# Patient Record
Sex: Female | Born: 1935
Health system: Southern US, Community
[De-identification: ages and names within clinical notes are randomized; demographics above are authoritative.]

## PROBLEM LIST (undated history)

## (undated) DIAGNOSIS — T4145XA Adverse effect of unspecified anesthetic, initial encounter: Secondary | ICD-10-CM

## (undated) DIAGNOSIS — Z8489 Family history of other specified conditions: Secondary | ICD-10-CM

## (undated) DIAGNOSIS — M1990 Unspecified osteoarthritis, unspecified site: Secondary | ICD-10-CM

## (undated) DIAGNOSIS — IMO0002 Reserved for concepts with insufficient information to code with codable children: Secondary | ICD-10-CM

## (undated) DIAGNOSIS — Z923 Personal history of irradiation: Secondary | ICD-10-CM

## (undated) DIAGNOSIS — T8859XA Other complications of anesthesia, initial encounter: Secondary | ICD-10-CM

## (undated) DIAGNOSIS — B029 Zoster without complications: Secondary | ICD-10-CM

## (undated) DIAGNOSIS — I10 Essential (primary) hypertension: Secondary | ICD-10-CM

## (undated) DIAGNOSIS — E785 Hyperlipidemia, unspecified: Secondary | ICD-10-CM

## (undated) DIAGNOSIS — Z9889 Other specified postprocedural states: Secondary | ICD-10-CM

## (undated) DIAGNOSIS — K219 Gastro-esophageal reflux disease without esophagitis: Secondary | ICD-10-CM

## (undated) HISTORY — DX: Unspecified osteoarthritis, unspecified site: M19.90

## (undated) HISTORY — DX: Other specified postprocedural states: Z98.890

## (undated) HISTORY — DX: Essential (primary) hypertension: I10

## (undated) HISTORY — DX: Hyperlipidemia, unspecified: E78.5

## (undated) HISTORY — PX: FRACTURE SURGERY: SHX138

## (undated) HISTORY — DX: Zoster without complications: B02.9

## (undated) HISTORY — DX: Reserved for concepts with insufficient information to code with codable children: IMO0002

---

## 1973-04-19 HISTORY — PX: ABDOMINAL HYSTERECTOMY: SHX81

## 1997-09-19 ENCOUNTER — Ambulatory Visit (HOSPITAL_BASED_OUTPATIENT_CLINIC_OR_DEPARTMENT_OTHER): Admission: RE | Admit: 1997-09-19 | Discharge: 1997-09-19 | Payer: Self-pay | Admitting: Orthopedic Surgery

## 1999-04-20 DIAGNOSIS — B029 Zoster without complications: Secondary | ICD-10-CM

## 1999-04-20 HISTORY — DX: Zoster without complications: B02.9

## 1999-07-27 ENCOUNTER — Encounter: Payer: Self-pay | Admitting: Gastroenterology

## 1999-07-27 ENCOUNTER — Encounter: Admission: RE | Admit: 1999-07-27 | Discharge: 1999-07-27 | Payer: Self-pay | Admitting: Gastroenterology

## 2000-07-29 ENCOUNTER — Encounter: Payer: Self-pay | Admitting: Gastroenterology

## 2000-07-29 ENCOUNTER — Encounter: Admission: RE | Admit: 2000-07-29 | Discharge: 2000-07-29 | Payer: Self-pay | Admitting: Gastroenterology

## 2001-08-18 ENCOUNTER — Encounter: Payer: Self-pay | Admitting: Gastroenterology

## 2001-08-18 ENCOUNTER — Encounter: Admission: RE | Admit: 2001-08-18 | Discharge: 2001-08-18 | Payer: Self-pay | Admitting: Gastroenterology

## 2001-10-25 ENCOUNTER — Other Ambulatory Visit: Admission: RE | Admit: 2001-10-25 | Discharge: 2001-10-25 | Payer: Self-pay | Admitting: Gastroenterology

## 2002-05-10 ENCOUNTER — Encounter: Payer: Self-pay | Admitting: Internal Medicine

## 2002-05-10 ENCOUNTER — Encounter: Admission: RE | Admit: 2002-05-10 | Discharge: 2002-05-10 | Payer: Self-pay | Admitting: Internal Medicine

## 2003-03-04 ENCOUNTER — Encounter: Admission: RE | Admit: 2003-03-04 | Discharge: 2003-03-04 | Payer: Self-pay | Admitting: Gastroenterology

## 2003-04-20 DIAGNOSIS — Z9889 Other specified postprocedural states: Secondary | ICD-10-CM

## 2003-04-20 HISTORY — PX: ROTATOR CUFF REPAIR: SHX139

## 2003-04-20 HISTORY — DX: Other specified postprocedural states: Z98.890

## 2003-12-06 ENCOUNTER — Ambulatory Visit (HOSPITAL_COMMUNITY): Admission: RE | Admit: 2003-12-06 | Discharge: 2003-12-06 | Payer: Self-pay | Admitting: Gastroenterology

## 2004-03-05 ENCOUNTER — Ambulatory Visit (HOSPITAL_COMMUNITY): Admission: RE | Admit: 2004-03-05 | Discharge: 2004-03-05 | Payer: Self-pay | Admitting: Orthopedic Surgery

## 2004-03-05 ENCOUNTER — Ambulatory Visit (HOSPITAL_BASED_OUTPATIENT_CLINIC_OR_DEPARTMENT_OTHER): Admission: RE | Admit: 2004-03-05 | Discharge: 2004-03-05 | Payer: Self-pay | Admitting: Orthopedic Surgery

## 2004-06-22 ENCOUNTER — Encounter: Admission: RE | Admit: 2004-06-22 | Discharge: 2004-06-22 | Payer: Self-pay | Admitting: Sports Medicine

## 2006-01-18 ENCOUNTER — Encounter: Admission: RE | Admit: 2006-01-18 | Discharge: 2006-01-18 | Payer: Self-pay | Admitting: Gastroenterology

## 2006-06-16 ENCOUNTER — Encounter: Admission: RE | Admit: 2006-06-16 | Discharge: 2006-06-16 | Payer: Self-pay | Admitting: Gastroenterology

## 2007-03-09 ENCOUNTER — Encounter: Admission: RE | Admit: 2007-03-09 | Discharge: 2007-03-09 | Payer: Self-pay | Admitting: Gastroenterology

## 2008-03-15 ENCOUNTER — Encounter: Admission: RE | Admit: 2008-03-15 | Discharge: 2008-03-15 | Payer: Self-pay | Admitting: Gastroenterology

## 2009-03-20 ENCOUNTER — Encounter: Admission: RE | Admit: 2009-03-20 | Discharge: 2009-03-20 | Payer: Self-pay | Admitting: Internal Medicine

## 2010-04-03 ENCOUNTER — Encounter
Admission: RE | Admit: 2010-04-03 | Discharge: 2010-04-03 | Payer: Self-pay | Source: Home / Self Care | Attending: Internal Medicine | Admitting: Internal Medicine

## 2010-05-09 ENCOUNTER — Encounter: Payer: Self-pay | Admitting: Gastroenterology

## 2010-09-04 NOTE — Op Note (Signed)
NAME:  Valerie Cooper, Valerie Cooper NO.:  [id number]   MEDICAL RECORD NO.:  [id number]          PATIENT TYPE:  AMB   LOCATION:  DSC                          FACILITY:  [facility]   PHYSICIAN:  Loreta Ave, M.D. DATE OF BIRTH:  17-Nov-1933   DATE OF PROCEDURE:  03/05/2004  DATE OF DISCHARGE:                                 OPERATIVE REPORT   PREOPERATIVE DIAGNOSES:  1.  Traumatic injury to right shoulder with partial tearing of rotator cuff.  2.  Recurrent impingement.  3.  Status post acromioplasty and distal clavicle excision, 1998, without      any symptoms until new trauma.   POSTOPERATIVE DIAGNOSES:  1.  Traumatic injury to right shoulder with partial tearing of rotator cuff.  2.  Recurrent impingement.  3.  Status post acromioplasty and distal clavicle excision, 1998, without      any symptoms until new trauma.   PROCEDURES:  1.  Right shoulder examination under anesthesia, arthroscopy, debridement of      rotator cuff from above and below.  2.  Revision of acromioplasty and distal clavicle excision.  3.  Debridement of bursa and adhesions.   SURGEON:  Loreta Ave, M.D.   ASSISTANT:  Genene Churn. Denton Meek.   ANESTHESIA:  General.   ESTIMATED BLOOD LOSS:  Minimal.   SPECIMENS:  None.   CULTURES:  None.   COMPLICATIONS:  None.   DRESSING:  Soft compressive with sling.   PROCEDURE:  The patient brought to the operating room and after adequate  anesthesia had been obtained, right shoulder examined.  Full motion and good  stability.  Placed in a beach chair position in a shoulder positioner  prepped and draped in the usual sterile fashion.  Three portals, anterior,  posterior, and lateral.  Shoulder entered with a blunt obturator, distended,  and inspected.  Some focal grade 3 changes, glenoid, debrided.  Labrum,  biceps tendon, biceps anchor, capsular and ligamentous structures,  undersurface cuff all intact.  The undersurface of the cuff, however, was  noted to be thinned, but no partial or full-thickness tearing.  Cannula  redirected subacromially.  Partial tearing, abrasive in nature, superior  cuff, especially supraspinatus tendon, debrided.  Thinned over the crescent  region, but no full-thickness tears.  Infraspinatus looked better.  Subacromial adhesions and bursitis, all debrided.  Acromion had a little  overgrowth in the front to a type 2 acromion with a revision acromioplasty  performed today, bringing it up to a type 1 acromion with shaver and high-  speed bur.  The  CA ligament re-released.  Distal clavicle exposed.  Inferiorly there was an excellent resection there, but there had been some  overgrowth superiorly.  Revision resection of the distal clavicle performed,  resecting a good centimeter so there was a centimeter gap both top and  bottom of the [facility] resection site.  All spurs removed.  Adequacy of  decompression and debridement confirmed viewing from all portals.  Instruments and fluid removed.  Portals, shoulder, and bursa injected with  Marcaine.  Portals closed with 4-0 nylon.  A sterile  compressive dressing  applied.  Anesthesia reversed.  Brought to the recovery room.  Tolerated the  surgery well with no complications.      Valentino Saxon   DFM/MEDQ  D:  03/05/2004  T:  03/06/2004  Job:  [id number]

## 2011-03-15 ENCOUNTER — Other Ambulatory Visit: Payer: Self-pay | Admitting: Internal Medicine

## 2011-03-15 DIAGNOSIS — Z1231 Encounter for screening mammogram for malignant neoplasm of breast: Secondary | ICD-10-CM

## 2011-04-16 ENCOUNTER — Ambulatory Visit
Admission: RE | Admit: 2011-04-16 | Discharge: 2011-04-16 | Disposition: A | Discharge status: Home / Self Care | Payer: BC Managed Care – PPO | Source: Ambulatory Visit | Attending: Internal Medicine | Admitting: Internal Medicine

## 2011-04-16 DIAGNOSIS — Z1231 Encounter for screening mammogram for malignant neoplasm of breast: Secondary | ICD-10-CM

## 2011-05-08 ENCOUNTER — Emergency Department: Payer: Self-pay | Admitting: Emergency Medicine

## 2011-05-19 ENCOUNTER — Other Ambulatory Visit: Payer: Self-pay | Admitting: Internal Medicine

## 2011-05-19 DIAGNOSIS — Z1231 Encounter for screening mammogram for malignant neoplasm of breast: Secondary | ICD-10-CM

## 2011-08-23 ENCOUNTER — Other Ambulatory Visit: Payer: Self-pay | Admitting: Internal Medicine

## 2011-08-23 DIAGNOSIS — K8020 Calculus of gallbladder without cholecystitis without obstruction: Secondary | ICD-10-CM

## 2011-09-20 ENCOUNTER — Other Ambulatory Visit: Payer: BC Managed Care – PPO

## 2011-09-21 ENCOUNTER — Other Ambulatory Visit: Payer: BC Managed Care – PPO

## 2011-09-22 ENCOUNTER — Ambulatory Visit
Admission: RE | Admit: 2011-09-22 | Discharge: 2011-09-22 | Disposition: A | Discharge status: Home / Self Care | Payer: BC Managed Care – PPO | Source: Ambulatory Visit | Attending: Internal Medicine | Admitting: Internal Medicine

## 2011-09-22 DIAGNOSIS — K8020 Calculus of gallbladder without cholecystitis without obstruction: Secondary | ICD-10-CM

## 2011-09-28 ENCOUNTER — Encounter (INDEPENDENT_AMBULATORY_CARE_PROVIDER_SITE_OTHER): Payer: Self-pay | Admitting: Surgery

## 2011-10-20 ENCOUNTER — Ambulatory Visit (INDEPENDENT_AMBULATORY_CARE_PROVIDER_SITE_OTHER): Payer: Medicare Other | Admitting: Surgery

## 2011-11-01 ENCOUNTER — Ambulatory Visit (INDEPENDENT_AMBULATORY_CARE_PROVIDER_SITE_OTHER): Payer: Medicare Other | Admitting: Surgery

## 2011-11-01 ENCOUNTER — Encounter (INDEPENDENT_AMBULATORY_CARE_PROVIDER_SITE_OTHER): Payer: Self-pay | Admitting: Surgery

## 2011-11-01 VITALS — BP 122/78 | HR 78 | Temp 97.0°F | Resp 16 | Ht 61.0 in | Wt 163.0 lb

## 2011-11-01 DIAGNOSIS — K279 Peptic ulcer, site unspecified, unspecified as acute or chronic, without hemorrhage or perforation: Secondary | ICD-10-CM | POA: Insufficient documentation

## 2011-11-01 DIAGNOSIS — K5710 Diverticulosis of small intestine without perforation or abscess without bleeding: Secondary | ICD-10-CM | POA: Insufficient documentation

## 2011-11-01 DIAGNOSIS — R7401 Elevation of levels of liver transaminase levels: Secondary | ICD-10-CM | POA: Insufficient documentation

## 2011-11-01 DIAGNOSIS — K8020 Calculus of gallbladder without cholecystitis without obstruction: Secondary | ICD-10-CM | POA: Insufficient documentation

## 2011-11-01 NOTE — Patient Instructions (Signed)
See the Handout(s) we gave you.  Consider surgery.  Please call our office at [phone] if you wish to schedule surgery or if you have further questions / concerns.   Cholelithiasis Cholelithiasis (also called gallstones) is a form of gallbladder disease where gallstones form in your gallbladder. The gallbladder is a non-essential organ that stores bile made in the liver, which helps digest fats. Gallstones begin as small crystals and slowly grow into stones. Gallstone pain occurs when the gallbladder spasms, and a gallstone is blocking the duct. Pain can also occur when a stone passes out of the duct.  Women are more likely to develop gallstones than men. Other factors that increase the risk of gallbladder disease are:  Having multiple pregnancies. Physicians sometimes advise removing diseased gallbladders before future pregnancies.   Obesity.   Diets heavy in fried foods and fat.   Increasing age (older than 60).   Prolonged use of medications containing female hormones.   Diabetes mellitus.   Rapid weight loss.   Family history of gallstones (heredity).  SYMPTOMS  Feeling sick to your stomach (nauseous).   Abdominal pain.   Yellowing of the skin (jaundice).   Sudden pain. It may persist from several minutes to several hours.   Worsening pain with deep breathing or when jarred.   Fever.   Tenderness to the touch.  In some cases, when gallstones do not move into the bile duct, people have no pain or symptoms. These are called "silent" gallstones. TREATMENT In severe cases, emergency surgery may be required. HOME CARE INSTRUCTIONS   Only take over-the-counter or prescription medicines for pain, discomfort, or fever as directed by your caregiver.   Follow a low-fat diet until seen again. Fat causes the gallbladder to contract, which can result in pain.   Follow up as instructed. Attacks are almost always recurrent and surgery is usually required for permanent  treatment.  SEEK IMMEDIATE MEDICAL CARE IF:   Your pain increases and is not controlled by medications.   You have an oral temperature above 102 F (38.9 C), not controlled by medication.   You develop nausea and vomiting.  MAKE SURE YOU:   Understand these instructions.   Will watch your condition.   Will get help right away if you are not doing well or get worse.  Document Released: 04/01/2005 Document Revised: 03/25/2011 Document Reviewed: 06/04/2010 [employer] Patient Information 2012 [employer]. 

## 2011-11-01 NOTE — Progress Notes (Signed)
Subjective:     Patient ID: Valerie Cooper, female   DOB: Jan 27, 1934, 78 y.o.   MRN: [id number]  HPI  Valerie Cooper  04/12/1934 [id number]  Patient Care Team: [name], MD as PCP - General (Internal Medicine)  This patient is a 78 y.o.female who presents today for surgical evaluation at the request of Dr. Nehemiah Settle.   Reason for visit: Upper abdominal pain.  Probable gallstone attack.  Pleasant obese female.  History of ulcer disease decades ago related to aspirin use.  Uses Prilosec intermittently a few times a year.  Avoids nonsteroidals.    After a relatively mild lunch in [place], she developed severe upper abdominal pain.  Forced her to stop off the side of the road.  Progressed to nausea and vomiting and lasted most of the day.  Eventually got better.  She tried to use Prilosec but that did not help.  Only after her she vomited that she started to feel better.  She's never had an attack like this before.  She presented to her primary care physician.  Blood work showed increased bilirubin.  Ultrasound showed gallstones but no cholecystitis.  She feels better now.  No other attacks.  This was NOT consistent with her heartburn.  That's usually a burning up the back of her throat and that was not like that all but more of a crampy severe sharp pain.  Repeat blood work is now normal.  Because of gallstones and increased liver function tests, her primary care physician suspected a biliary etiology.  She has been sent to [place] to consider cholecystectomy  Patient Active Problem List  Diagnosis  . Duodenal diverticulum  . Symptomatic cholelithiasis  . Transaminitis, transient, possible transient chloedocolithiasis  . PUD (peptic ulcer disease) in distant past    Past Medical History  Diagnosis Date  . Ulcer 1980s    PUD ?due to ASA intake  . Hyperlipidemia   . Hypertension   . H/O colonoscopy 2005  . Shingles 2001    right leg  . Cataract   . DJD (degenerative joint disease)   .  Osteoporosis     Past Surgical History  Procedure Date  . Abdominal hysterectomy 1975  . Rotator cuff repair 2005    4 surgeries    History   Social History  . Marital Status: Widowed    Spouse Name: N/A    Number of Children: N/A  . Years of Education: N/A   Occupational History  . Not on file.   Social History Main Topics  . Smoking status: Never Smoker   . Smokeless tobacco: Not on file  . Alcohol Use: No  . Drug Use: No  . Sexually Active:    Other Topics Concern  . Not on file   Social History Narrative  . No narrative on file    Family History  Problem Relation Age of Onset  . Stroke Mother   . Stroke Father     No current outpatient prescriptions on file.     No Known Allergies  BP 122/78  Pulse 78  Temp 97 F (36.1 C) (Temporal)  Resp 16  Ht 5\' 1"  (1.549 m)  Wt 163 lb (73.936 kg)  BMI 30.80 kg/m2  No results found.   Review of Systems  Constitutional: Negative for fever, chills, diaphoresis, appetite change and fatigue.  HENT: Negative for ear pain, sore throat, trouble swallowing, neck pain and ear discharge.   Eyes: Negative for photophobia, discharge and visual  disturbance.  Respiratory: Negative for cough, choking, chest tightness and shortness of breath.   Cardiovascular: Negative for chest pain, palpitations and leg swelling.       Patient walks 20 minutes for about 1/2 mile without difficulty.  No exertional chest/neck/shoulder/arm pain.   Gastrointestinal: Positive for nausea, vomiting and abdominal pain. Negative for diarrhea, constipation, blood in stool, abdominal distention, anal bleeding and rectal pain.       No personal nor family history of GI/colon cancer, inflammatory bowel disease, irritable bowel syndrome, allergy such as Celiac Sprue, dietary/dairy problems, colitis, ulcers nor gastritis.    No recent sick contacts/gastroenteritis.  No travel outside the country.  No changes in diet.    Genitourinary: Negative for  dysuria, frequency and difficulty urinating.  Musculoskeletal: Negative for myalgias and gait problem.       Left toe injury - in boot  Skin: Negative for color change, pallor and rash.  Neurological: Negative for dizziness, speech difficulty, weakness and numbness.  Hematological: Negative for adenopathy.  Psychiatric/Behavioral: Negative for confusion and agitation. The patient is not nervous/anxious.        Objective:   Physical Exam  Constitutional: She is oriented to person, place, and time. She appears well-developed and well-nourished. No distress.  HENT:  Head: Normocephalic.  Mouth/Throat: Oropharynx is clear and moist. No oropharyngeal exudate.  Eyes: Conjunctivae and EOM are normal. Pupils are equal, round, and reactive to light. No scleral icterus.  Neck: Normal range of motion. Neck supple. No tracheal deviation present.  Cardiovascular: Normal rate, regular rhythm and intact distal pulses.   Pulmonary/Chest: Effort normal and breath sounds normal. No respiratory distress. She exhibits no tenderness.  Abdominal: Soft. She exhibits no distension and no mass. There is no tenderness. There is no rebound and no guarding. Hernia confirmed negative in the right inguinal area and confirmed negative in the left inguinal area.       Obese, soft.  Low midline incision  Genitourinary: No vaginal discharge found.  Musculoskeletal: Normal range of motion. She exhibits no tenderness.  Lymphadenopathy:    She has no cervical adenopathy.       Right: No inguinal adenopathy present.       Left: No inguinal adenopathy present.  Neurological: She is alert and oriented to person, place, and time. No cranial nerve deficit. She exhibits normal muscle tone. Coordination normal.  Skin: Skin is warm and dry. No rash noted. She is not diaphoretic. No erythema.  Psychiatric: She has a normal mood and affect. Her behavior is normal. Judgment and thought content normal.       Assessment:     Abd  pain/N/V with transient elevated LFTs, Probable Sx gallstones.  DiffDx unlikely    Plan:     laparoscopy chole.  Reasonable to try single site approach.  In also did discuss considering gastroenterology evaluation given your history of peptic ulcer disease.  However, she has different symptoms with that.  She rarely uses PPIs now ("4 times a year at most") and avoids all nonsteroidals.  She is leaning more toward surgery but wants to wait a few months.  I cautioned her not to wait too long lest she get another attack since the last episode probably had some at least transient choledocholithiasis.  She will think about things and let me know.  I gave her my card.  I discussed the procedure in detail:  The anatomy & physiology of hepatobiliary & pancreatic function was discussed.  The pathophysiology of gallbladder dysfunction  was discussed.  Natural history risks without surgery was discussed.   I feel the risks of no intervention will lead to serious problems that outweigh the operative risks; therefore, I recommended cholecystectomy to remove the pathology.  I explained laparoscopic techniques with possible need for an open approach.  Probable cholangiogram to evaluate the bilary tract was explained as well.    Risks such as bleeding, infection, abscess, leak, injury to other organs, need for further treatment, heart attack, death, and other risks were discussed.  I noted a good likelihood this will help address the problem.  Possibility that this will not correct all abdominal symptoms was explained.  Goals of post-operative recovery were discussed as well.  We will work to minimize complications.  An educational handout further explaining the pathology and treatment options was given as well.  Questions were answered.  The patient expresses understanding & wishes to proceed with surgery.

## 2012-01-03 ENCOUNTER — Encounter (INDEPENDENT_AMBULATORY_CARE_PROVIDER_SITE_OTHER): Payer: Self-pay

## 2012-04-17 ENCOUNTER — Ambulatory Visit
Admission: RE | Admit: 2012-04-17 | Discharge: 2012-04-17 | Disposition: A | Discharge status: Home / Self Care | Payer: Medicare Other | Source: Ambulatory Visit | Attending: Internal Medicine | Admitting: Internal Medicine

## 2012-04-17 DIAGNOSIS — Z1231 Encounter for screening mammogram for malignant neoplasm of breast: Secondary | ICD-10-CM

## 2012-05-17 ENCOUNTER — Other Ambulatory Visit: Payer: Self-pay | Admitting: Internal Medicine

## 2012-05-17 DIAGNOSIS — Z1231 Encounter for screening mammogram for malignant neoplasm of breast: Secondary | ICD-10-CM

## 2012-05-29 ENCOUNTER — Observation Stay (HOSPITAL_COMMUNITY)
Admission: EM | Admit: 2012-05-29 | Discharge: 2012-05-30 | Disposition: A | Discharge status: Home / Self Care | Payer: Medicare Other | Attending: Surgery | Admitting: Surgery

## 2012-05-29 ENCOUNTER — Observation Stay (HOSPITAL_COMMUNITY): Payer: Medicare Other | Admitting: Anesthesiology

## 2012-05-29 ENCOUNTER — Encounter (HOSPITAL_COMMUNITY): Payer: Self-pay | Admitting: Anesthesiology

## 2012-05-29 ENCOUNTER — Encounter (HOSPITAL_COMMUNITY)
Admission: EM | Disposition: A | Discharge status: Home / Self Care | Payer: Self-pay | Source: Home / Self Care | Attending: Emergency Medicine

## 2012-05-29 ENCOUNTER — Emergency Department (HOSPITAL_COMMUNITY): Payer: Medicare Other

## 2012-05-29 ENCOUNTER — Encounter (HOSPITAL_COMMUNITY): Payer: Self-pay | Admitting: *Deleted

## 2012-05-29 ENCOUNTER — Observation Stay (HOSPITAL_COMMUNITY): Payer: Medicare Other

## 2012-05-29 DIAGNOSIS — K8010 Calculus of gallbladder with chronic cholecystitis without obstruction: Secondary | ICD-10-CM

## 2012-05-29 DIAGNOSIS — K8000 Calculus of gallbladder with acute cholecystitis without obstruction: Secondary | ICD-10-CM

## 2012-05-29 DIAGNOSIS — R112 Nausea with vomiting, unspecified: Secondary | ICD-10-CM

## 2012-05-29 DIAGNOSIS — M810 Age-related osteoporosis without current pathological fracture: Secondary | ICD-10-CM | POA: Insufficient documentation

## 2012-05-29 DIAGNOSIS — R109 Unspecified abdominal pain: Secondary | ICD-10-CM

## 2012-05-29 DIAGNOSIS — I10 Essential (primary) hypertension: Secondary | ICD-10-CM | POA: Insufficient documentation

## 2012-05-29 DIAGNOSIS — E785 Hyperlipidemia, unspecified: Secondary | ICD-10-CM | POA: Insufficient documentation

## 2012-05-29 HISTORY — DX: Other complications of anesthesia, initial encounter: T88.59XA

## 2012-05-29 HISTORY — DX: Adverse effect of unspecified anesthetic, initial encounter: T41.45XA

## 2012-05-29 HISTORY — DX: Family history of other specified conditions: Z84.89

## 2012-05-29 HISTORY — PX: CHOLECYSTECTOMY: SHX55

## 2012-05-29 LAB — CBC WITH DIFFERENTIAL/PLATELET
Basophils Relative: 0 % (ref 0–1)
Eosinophils Absolute: 0 10*3/uL (ref 0.0–0.7)
Hemoglobin: 11.9 g/dL — ABNORMAL LOW (ref 12.0–15.0)
MCH: 31.8 pg (ref 26.0–34.0)
MCHC: 34.4 g/dL (ref 30.0–36.0)
Monocytes Absolute: 0.8 10*3/uL (ref 0.1–1.0)
Monocytes Relative: 5 % (ref 3–12)
Neutrophils Relative %: 83 % — ABNORMAL HIGH (ref 43–77)

## 2012-05-29 LAB — URINALYSIS, ROUTINE W REFLEX MICROSCOPIC
Nitrite: NEGATIVE
Specific Gravity, Urine: 1.022 (ref 1.005–1.030)
pH: 6.5 (ref 5.0–8.0)

## 2012-05-29 LAB — COMPREHENSIVE METABOLIC PANEL
Albumin: 4.1 g/dL (ref 3.5–5.2)
BUN: 28 mg/dL — ABNORMAL HIGH (ref 6–23)
Creatinine, Ser: 1.02 mg/dL (ref 0.50–1.10)
Total Protein: 8 g/dL (ref 6.0–8.3)

## 2012-05-29 LAB — LIPASE, BLOOD: Lipase: 28 U/L (ref 11–59)

## 2012-05-29 LAB — URINE MICROSCOPIC-ADD ON

## 2012-05-29 LAB — SURGICAL PCR SCREEN: Staphylococcus aureus: POSITIVE — AB

## 2012-05-29 SURGERY — LAPAROSCOPIC CHOLECYSTECTOMY WITH INTRAOPERATIVE CHOLANGIOGRAM
Anesthesia: General | Site: Abdomen | Wound class: Clean Contaminated

## 2012-05-29 MED ORDER — KCL IN DEXTROSE-NACL 20-5-0.9 MEQ/L-%-% IV SOLN
INTRAVENOUS | Status: DC
  Administered 2012-05-30: 100 mL/h via INTRAVENOUS
  Filled 2012-05-29 (×5): qty 1000

## 2012-05-29 MED ORDER — DEXTROSE IN LACTATED RINGERS 5 % IV SOLN
INTRAVENOUS | Status: DC
  Administered 2012-05-29: 11:00:00 via INTRAVENOUS

## 2012-05-29 MED ORDER — OXYCODONE HCL 5 MG PO TABS: 5.0000 mg | ORAL_TABLET | Freq: Once | ORAL | Status: DC | PRN

## 2012-05-29 MED ORDER — FENTANYL CITRATE 0.05 MG/ML IJ SOLN: 50.0000 ug | Freq: Once | INTRAMUSCULAR | Status: DC

## 2012-05-29 MED ORDER — HYDROMORPHONE HCL PF 1 MG/ML IJ SOLN: 0.2500 mg | INTRAMUSCULAR | Status: DC | PRN

## 2012-05-29 MED ORDER — BUPIVACAINE-EPINEPHRINE PF 0.25-1:200000 % IJ SOLN
INTRAMUSCULAR | Status: AC
  Filled 2012-05-29: qty 30

## 2012-05-29 MED ORDER — PROPOFOL 10 MG/ML IV BOLUS
INTRAVENOUS | Status: DC | PRN
  Administered 2012-05-29: 140 mg via INTRAVENOUS

## 2012-05-29 MED ORDER — ROCURONIUM BROMIDE 100 MG/10ML IV SOLN
INTRAVENOUS | Status: DC | PRN
  Administered 2012-05-29: 25 mg via INTRAVENOUS

## 2012-05-29 MED ORDER — GLYCOPYRROLATE 0.2 MG/ML IJ SOLN
INTRAMUSCULAR | Status: DC | PRN
  Administered 2012-05-29: .6 mg via INTRAVENOUS

## 2012-05-29 MED ORDER — HYDROCHLOROTHIAZIDE 25 MG PO TABS
25.0000 mg | ORAL_TABLET | Freq: Every day | ORAL | Status: DC
  Administered 2012-05-30: 25 mg via ORAL
  Filled 2012-05-29 (×3): qty 1

## 2012-05-29 MED ORDER — LACTATED RINGERS IV SOLN
INTRAVENOUS | Status: DC | PRN
  Administered 2012-05-29 (×2): via INTRAVENOUS

## 2012-05-29 MED ORDER — PANTOPRAZOLE SODIUM 40 MG IV SOLR
40.0000 mg | Freq: Every day | INTRAVENOUS | Status: DC
  Administered 2012-05-29: 40 mg via INTRAVENOUS
  Filled 2012-05-29 (×2): qty 40

## 2012-05-29 MED ORDER — GI COCKTAIL ~~LOC~~: 30.0000 mL | Freq: Once | ORAL | Status: DC

## 2012-05-29 MED ORDER — GLUCAGON HCL (RDNA) 1 MG IJ SOLR
INTRAMUSCULAR | Status: AC
  Filled 2012-05-29: qty 1

## 2012-05-29 MED ORDER — ONDANSETRON HCL 4 MG/2ML IJ SOLN
INTRAMUSCULAR | Status: DC | PRN
  Administered 2012-05-29: 4 mg via INTRAVENOUS

## 2012-05-29 MED ORDER — BUPIVACAINE-EPINEPHRINE 0.25% -1:200000 IJ SOLN
INTRAMUSCULAR | Status: DC | PRN
  Administered 2012-05-29: 27 mL

## 2012-05-29 MED ORDER — LOSARTAN POTASSIUM 50 MG PO TABS
100.0000 mg | ORAL_TABLET | Freq: Every day | ORAL | Status: DC
  Administered 2012-05-30: 100 mg via ORAL
  Filled 2012-05-29 (×3): qty 2

## 2012-05-29 MED ORDER — SODIUM CHLORIDE 0.9 % IR SOLN
Status: DC | PRN
  Administered 2012-05-29: 1000 mL

## 2012-05-29 MED ORDER — LOSARTAN POTASSIUM-HCTZ 100-25 MG PO TABS: 1.0000 | ORAL_TABLET | Freq: Every day | ORAL | Status: DC

## 2012-05-29 MED ORDER — 0.9 % SODIUM CHLORIDE (POUR BTL) OPTIME
TOPICAL | Status: DC | PRN
  Administered 2012-05-29: 1000 mL

## 2012-05-29 MED ORDER — SODIUM CHLORIDE 0.9 % IV SOLN
INTRAVENOUS | Status: DC | PRN
  Administered 2012-05-29: 13:00:00

## 2012-05-29 MED ORDER — DEXTROSE IN LACTATED RINGERS 5 % IV SOLN
INTRAVENOUS | Status: DC
  Administered 2012-05-29: 05:00:00 via INTRAVENOUS

## 2012-05-29 MED ORDER — PROMETHAZINE HCL 25 MG/ML IJ SOLN: 6.2500 mg | INTRAMUSCULAR | Status: DC | PRN

## 2012-05-29 MED ORDER — LIDOCAINE HCL (CARDIAC) 20 MG/ML IV SOLN
INTRAVENOUS | Status: DC | PRN
  Administered 2012-05-29: 80 mg via INTRAVENOUS

## 2012-05-29 MED ORDER — KETOROLAC TROMETHAMINE 30 MG/ML IJ SOLN
15.0000 mg | Freq: Once | INTRAMUSCULAR | Status: AC
  Administered 2012-05-29: 15 mg via INTRAVENOUS
  Filled 2012-05-29: qty 1

## 2012-05-29 MED ORDER — MORPHINE SULFATE 4 MG/ML IJ SOLN: 4.0000 mg | INTRAMUSCULAR | Status: DC | PRN

## 2012-05-29 MED ORDER — MEPERIDINE HCL 25 MG/ML IJ SOLN: 6.2500 mg | INTRAMUSCULAR | Status: DC | PRN

## 2012-05-29 MED ORDER — ONDANSETRON HCL 4 MG/2ML IJ SOLN: 4.0000 mg | Freq: Once | INTRAMUSCULAR | Status: DC

## 2012-05-29 MED ORDER — GLUCAGON HCL (RDNA) 1 MG IJ SOLR
INTRAMUSCULAR | Status: DC | PRN
  Administered 2012-05-29: 1 mg via INTRAVENOUS

## 2012-05-29 MED ORDER — ONDANSETRON HCL 4 MG/2ML IJ SOLN: 4.0000 mg | Freq: Four times a day (QID) | INTRAMUSCULAR | Status: DC | PRN

## 2012-05-29 MED ORDER — POTASSIUM CHLORIDE 10 MEQ/100ML IV SOLN
10.0000 meq | INTRAVENOUS | Status: AC
  Administered 2012-05-29 (×2): 10 meq via INTRAVENOUS
  Filled 2012-05-29 (×4): qty 100

## 2012-05-29 MED ORDER — NEOSTIGMINE METHYLSULFATE 1 MG/ML IJ SOLN
INTRAMUSCULAR | Status: DC | PRN
  Administered 2012-05-29: 5 mg via INTRAVENOUS

## 2012-05-29 MED ORDER — ATORVASTATIN CALCIUM 20 MG PO TABS
20.0000 mg | ORAL_TABLET | Freq: Every day | ORAL | Status: DC
  Administered 2012-05-29 – 2012-05-30 (×2): 20 mg via ORAL
  Filled 2012-05-29 (×2): qty 1

## 2012-05-29 MED ORDER — OXYCODONE HCL 5 MG/5ML PO SOLN: 5.0000 mg | Freq: Once | ORAL | Status: DC | PRN

## 2012-05-29 MED ORDER — FENTANYL CITRATE 0.05 MG/ML IJ SOLN
INTRAMUSCULAR | Status: DC | PRN
  Administered 2012-05-29 (×4): 50 ug via INTRAVENOUS

## 2012-05-29 MED ORDER — DEXTROSE 5 % IV SOLN
2.0000 g | Freq: Once | INTRAVENOUS | Status: AC
  Administered 2012-05-29: 2 g via INTRAVENOUS
  Filled 2012-05-29: qty 2

## 2012-05-29 MED ORDER — DEXTROSE 5 % IV SOLN: 2.0000 g | Freq: Two times a day (BID) | INTRAVENOUS | Status: DC

## 2012-05-29 MED ORDER — FENTANYL CITRATE 0.05 MG/ML IJ SOLN
50.0000 ug | Freq: Once | INTRAMUSCULAR | Status: AC
  Administered 2012-05-29: 50 ug via INTRAVENOUS
  Filled 2012-05-29: qty 2

## 2012-05-29 MED ORDER — CEFAZOLIN SODIUM-DEXTROSE 2-3 GM-% IV SOLR
INTRAVENOUS | Status: AC
  Administered 2012-05-29: 2 g via INTRAVENOUS
  Filled 2012-05-29: qty 50

## 2012-05-29 SURGICAL SUPPLY — 55 items
ADH SKN CLS APL DERMABOND .7 (GAUZE/BANDAGES/DRESSINGS) ×1
APPLIER CLIP 5 13 M/L LIGAMAX5 (MISCELLANEOUS)
APPLIER CLIP ROT 10 11.4 M/L (STAPLE) ×2
APR CLP MED LRG 11.4X10 (STAPLE) ×1
APR CLP MED LRG 5 ANG JAW (MISCELLANEOUS)
BAG SPEC RTRVL LRG 6X4 10 (ENDOMECHANICALS) ×1
BLADE SURG ROTATE 9660 (MISCELLANEOUS) IMPLANT
CANISTER SUCTION 2500CC (MISCELLANEOUS) ×2 IMPLANT
CHLORAPREP W/TINT 26ML (MISCELLANEOUS) ×2 IMPLANT
CHOLANGIOGRAM CATH TAUT (CATHETERS) ×2 IMPLANT
CLIP APPLIE 5 13 M/L LIGAMAX5 (MISCELLANEOUS) IMPLANT
CLIP APPLIE ROT 10 11.4 M/L (STAPLE) IMPLANT
CLOTH BEACON ORANGE TIMEOUT ST (SAFETY) ×2 IMPLANT
COVER MAYO STAND STRL (DRAPES) ×2 IMPLANT
COVER SURGICAL LIGHT HANDLE (MISCELLANEOUS) ×3 IMPLANT
DECANTER SPIKE VIAL GLASS SM (MISCELLANEOUS) ×2 IMPLANT
DERMABOND ADVANCED (GAUZE/BANDAGES/DRESSINGS) ×1
DERMABOND ADVANCED .7 DNX12 (GAUZE/BANDAGES/DRESSINGS) ×1 IMPLANT
DRAPE C-ARM 42X72 X-RAY (DRAPES) ×2 IMPLANT
DRAPE UTILITY 15X26 W/TAPE STR (DRAPE) ×4 IMPLANT
ELECT REM PT RETURN 9FT ADLT (ELECTROSURGICAL) ×2
ELECTRODE REM PT RTRN 9FT ADLT (ELECTROSURGICAL) ×1 IMPLANT
FILTER SMOKE EVAC LAPAROSHD (FILTER) ×2 IMPLANT
GLOVE BIO SURGEON STRL SZ7.5 (GLOVE) ×1 IMPLANT
GLOVE BIOGEL PI IND STRL 7.0 (GLOVE) IMPLANT
GLOVE BIOGEL PI IND STRL 7.5 (GLOVE) IMPLANT
GLOVE BIOGEL PI IND STRL 8 (GLOVE) IMPLANT
GLOVE BIOGEL PI INDICATOR 7.0 (GLOVE) ×1
GLOVE BIOGEL PI INDICATOR 7.5 (GLOVE) ×1
GLOVE BIOGEL PI INDICATOR 8 (GLOVE) ×1
GLOVE ECLIPSE 7.0 STRL STRAW (GLOVE) ×1 IMPLANT
GLOVE ECLIPSE 7.5 STRL STRAW (GLOVE) ×1 IMPLANT
GLOVE SURG SIGNA 7.5 PF LTX (GLOVE) ×2 IMPLANT
GLOVE SURG SS PI 7.0 STRL IVOR (GLOVE) ×1 IMPLANT
GOWN STRL NON-REIN LRG LVL3 (GOWN DISPOSABLE) ×6 IMPLANT
GOWN STRL REIN XL XLG (GOWN DISPOSABLE) ×2 IMPLANT
IV CATH 14GX2 1/4 (CATHETERS) ×2 IMPLANT
KIT BASIN OR (CUSTOM PROCEDURE TRAY) ×2 IMPLANT
KIT ROOM TURNOVER OR (KITS) ×2 IMPLANT
NS IRRIG 1000ML POUR BTL (IV SOLUTION) ×2 IMPLANT
PAD ARMBOARD 7.5X6 YLW CONV (MISCELLANEOUS) ×2 IMPLANT
POUCH SPECIMEN RETRIEVAL 10MM (ENDOMECHANICALS) ×2 IMPLANT
SCISSORS LAP 5X35 DISP (ENDOMECHANICALS) IMPLANT
SET IRRIG TUBING LAPAROSCOPIC (IRRIGATION / IRRIGATOR) ×2 IMPLANT
SPECIMEN JAR SMALL (MISCELLANEOUS) ×2 IMPLANT
STOPCOCK 4 WAY LG BORE MALE ST (IV SETS) ×2 IMPLANT
SUT VIC AB 5-0 PS2 18 (SUTURE) ×2 IMPLANT
TOWEL OR 17X24 6PK STRL BLUE (TOWEL DISPOSABLE) ×2 IMPLANT
TOWEL OR 17X26 10 PK STRL BLUE (TOWEL DISPOSABLE) ×2 IMPLANT
TRAY LAPAROSCOPIC (CUSTOM PROCEDURE TRAY) ×2 IMPLANT
TROCAR XCEL BLUNT TIP 100MML (ENDOMECHANICALS) ×2 IMPLANT
TROCAR Z-THREAD FIOS 11X100 BL (TROCAR) ×1 IMPLANT
TROCAR Z-THREAD FIOS 5X100MM (TROCAR) ×4 IMPLANT
TUBING EXTENTION W/L.L. (IV SETS) ×2 IMPLANT
WATER STERILE IRR 1000ML POUR (IV SOLUTION) IMPLANT

## 2012-05-29 NOTE — Op Note (Addendum)
05/29/2012  1:50 PM  PATIENT:  Valerie Cooper, 79 y.o., female, MRN: [id number]  PREOP DIAGNOSIS:  Gallstones, cholecystitis  POSTOP DIAGNOSIS:   Acute edematous cholecystitis, cholelithiaisis  PROCEDURE:   Procedure(s): LAPAROSCOPIC CHOLECYSTECTOMY WITH INTRAOPERATIVE CHOLANGIOGRAM  SURGEON:   Ovidio Kin, M.D.  ASSISTANT:   Jettie Pagan, P.A.  ANESTHESIA:   general  Anesthesiologist: Josepha Pigg, MD CRNA: Margaree Mackintosh, CRNA; Linde Gillis, CRNA; Rogelia Boga, CRNA  General  EBL:  Minimal  ml  BLOOD ADMINISTERED: none  DRAINS: none   LOCAL MEDICATIONS USED:   27 cc 1/4% marcaine  SPECIMEN:   Gall bladder  COUNTS CORRECT:  YES  INDICATIONS FOR PROCEDURE:  Valerie Cooper is a 79 y.o. (DOB: [birth year]) AA  female whose primary care physician is [name], MD and comes for cholecystectomy.  She was initially seen by Dr. Demetrius Charity. Carolynne Edouard in the ER and admitted.   The indications and risks of the gall bladder surgery were explained to the patient.  The risks include, but are not limited to, infection, bleeding, common bile duct injury and open surgery.  SURGERY:  The patient was taken to room #1 at [facility]  The abdomen was prepped with chloroprep.  The patient was given 2 gm Ancef at the beginning of the operation.   A time out was held and the surgical checklist run.   An infraumbilical incision was made into the abdominal cavity.  A 12 mm Hasson trocar was inserted into the abdominal cavity through the infraumbilical incision and secured with a 0 Vicryl suture.  Three additional trocars were inserted: a 10 mm trocar in the sub-xiphoid location, a 5 mm trocar in the right mid subcostal area, and a 5 mm trocar in the right lateral subcostal area.   The abdomen was explored and the liver, stomach, and bowel that could be seen were unremarkable.  The patient had adhesions from her prior hysterectomy involving the lower aspect of her abdominal cavity.  These  were taken down with finger dissection through her umbilical incision.   The gall bladder was identified, grasped, and rotated cephalad.  It was noted to be edematous.  I did decompress the gall bladder.  Disssection was carried down to the gall bladder/cystic duct junction and the cystic duct isolated.  A clip was placed on the gall bladder side of the cystic duct.   An intra-operative cholangiogram was shot.   The intra-operative cholangiogram was shot using a cut off Taut catheter placed through a 14 gauge angiocath in the RUQ.  The Taut catheter was inserted in the cut cystic duct and secured with an endoclip.  A cholangiogram was shot with 20 cc of 1/2 strength Omnipaque.  Using fluoroscopy, the cholangiogram showed the flow of contrast into the common bile duct, up the hepatic radicals, and to the distal common bile duct.  It CBD did not empty initially.  I gave the patient 1 mg og glucogan and reshot the cholangiogram after 2 mintues and there was contrast that flowed into the duodenum.  His LFT's were essentially normal preop, so I think that he just had spasm at the ampulla.  But we will recheck his LFT's tomorrow.   The Taut catheter was removed.  The cystic duct was tripley endoclipped and the cystic artery was identified and clipped.  The gall bladder was bluntly and sharpley dissected from the gall bladder bed.   After the gall bladder was removed from the liver, the gall bladder  bed and [place] were inspected.  There was no bleeding or bile leak.  The gall bladder was placed in a endocatch bag and delivered through the umbilicus.  The abdomen was irrigated with 1,000 cc saline.   The trocars were then removed.  I infiltrated 27cc of 1/4% Marcaine into the incisions.  The umbilical port closed with a 0 Vicryl suture and the skin closed with 4-0 monocryl.  The skin was painted with Dermabond.  The patient's sponge and needle count were correct.  The patient was transported to the [facility]  in good condition.  Ovidio Kin, MD, [facility] Surgery Pager: [phone] Office phone:  [phone]

## 2012-05-29 NOTE — ED Provider Notes (Signed)
History     CSN: [id number]  Arrival date & time 05/29/12  0048   First MD Initiated Contact with Patient 05/29/12 0121      Chief Complaint  Patient presents with  . Abdominal Pain    (Consider location/radiation/quality/duration/timing/severity/associated sxs/prior treatment) HPI  Valerie Cooper is a 79 y.o. female With a history of "gallbladder attacks" last summer for which she was evaluated by [facility] surgery and was supposed to set up a date for elective cholecystectomy but hasn't do to scheduling conflicts presents with right upper quadrant pain, nausea and vomiting x2 she says this is the same kind of pain happen when her gallbladder acting up the last time. She describes the pain as sharp, 10 out of 10, this started immediately after she ate some cake. She denies any chest pains, shortness of breath, diarrhea, fevers or chills, dizziness, lightheadedness, headache.    Past Medical History  Diagnosis Date  . Ulcer 1980s    PUD ?due to ASA intake  . Hyperlipidemia   . Hypertension   . H/O colonoscopy 2005  . Shingles 2001    right leg  . Cataract   . DJD (degenerative joint disease)   . Osteoporosis     Past Surgical History  Procedure Laterality Date  . Abdominal hysterectomy  1975  . Rotator cuff repair  2005    4 surgeries    Family History  Problem Relation Age of Onset  . Stroke Mother   . Stroke Father     History  Substance Use Topics  . Smoking status: Never Smoker   . Smokeless tobacco: Not on file  . Alcohol Use: No    OB History   Grav Para Term Preterm Abortions TAB SAB Ect Mult Living                  Review of Systems At least 10pt or greater review of systems completed and are negative except where specified in the HPI.  Allergies  Review of patient's allergies indicates no known allergies.  Home Medications   Current Outpatient Rx  Name  Route  Sig  Dispense  Refill  . atorvastatin (LIPITOR) 20 MG tablet   Oral    Take 20 mg by mouth daily.         Marland Kitchen losartan-hydrochlorothiazide (HYZAAR) 100-25 MG per tablet   Oral   Take 1 tablet by mouth daily.           BP 147/70  Pulse 74  Temp(Src) 98.3 F (36.8 C) (Oral)  SpO2 100%  Physical Exam  Nursing notes reviewed.  Electronic medical record reviewed. VITAL SIGNS:   Filed Vitals:   05/29/12 0055  BP: 147/70  Pulse: 74  Temp: 98.3 F (36.8 C)  TempSrc: Oral  SpO2: 100%   CONSTITUTIONAL: Awake, oriented, appears non-toxic HENT: Atraumatic, normocephalic, oral mucosa pink and moist, airway patent. Nares patent without drainage. External ears normal. EYES: Conjunctiva clear, EOMI, PERRLA NECK: Trachea midline, non-tender, supple CARDIOVASCULAR: Normal heart rate, Normal rhythm, No murmurs, rubs, gallops PULMONARY/CHEST: Clear to auscultation, no rhonchi, wheezes, or rales. Symmetrical breath sounds. Non-tender. ABDOMINAL: Non-distended, obese, soft, Tenderness to palpation in the RUQ and epigastrium.  BS normal. NEUROLOGIC: Non-focal, moving all four extremities, no gross sensory or motor deficits. EXTREMITIES: No clubbing, cyanosis, or edema SKIN: Warm, Dry, No erythema, No rash  ED Course  Procedures (including critical care time)  Date: 05/29/2012  Rate: 80  Rhythm: normal sinus rhythm  QRS Axis:  normal  Intervals: QTC is 480 - mildly prolonged  ST/T Wave abnormalities: normal  Conduction Disutrbances: first degree AV block  Narrative Interpretation: first degree AV block, sinus rhythm-since prior EKG dated 03/03/2004, the first degree AV block is new. No new ST or T wave abnormalities suggestive of acute ischemia or infarction.  Labs Reviewed  CBC WITH DIFFERENTIAL - Abnormal; Notable for the following:    WBC 16.7 (*)    RBC 3.74 (*)    Hemoglobin 11.9 (*)    HCT 34.6 (*)    Neutrophils Relative 83 (*)    Neutro Abs 13.9 (*)    All other components within normal limits  COMPREHENSIVE METABOLIC PANEL - Abnormal; Notable  for the following:    Potassium 3.0 (*)    Glucose, Bld 155 (*)    BUN 28 (*)    Total Bilirubin 0.2 (*)    GFR calc non Af Amer 52 (*)    GFR calc Af Amer 60 (*)    All other components within normal limits  URINALYSIS, ROUTINE W REFLEX MICROSCOPIC - Abnormal; Notable for the following:    Leukocytes, UA TRACE (*)    All other components within normal limits  LIPASE, BLOOD  URINE MICROSCOPIC-ADD ON   US Abdomen Complete  05/29/2012  *RADIOLOGY REPORT*  Clinical Data:  Right upper quadrant abdominal pain.  ABDOMINAL ULTRASOUND COMPLETE  Comparison:  Abdominal ultrasound performed 09/22/2011  Findings:  Gallbladder: There is diffuse gallbladder wall thickening, measuring 0.5 cm, with scattered stones seen layering dependently in the gallbladder, mild gallbladder distension and a positive ultrasonographic Murphy's sign.  This raises concern for mild acute cholecystitis, though no pericholecystic fluid is seen.  Common Bile Duct:  0.9 cm in diameter; borderline prominent for the patient's age.  Liver:  Mildly heterogeneous echogenicity and coarsened echotexture, raising concern for fatty infiltration; no focal lesions identified.  There is apparent mild dilatation of the intrahepatic biliary ducts.  Limited Doppler evaluation demonstrates normal blood flow within the liver.  IVC:  Unremarkable in appearance.  Pancreas:  Although the pancreas is difficult to visualize in its entirety due to overlying bowel gas, no focal pancreatic abnormality is identified.  Spleen:  6.9 cm in length; within normal limits in size and echotexture.  Right kidney:  9.4 cm in length; normal in size, configuration and parenchymal echogenicity.  No evidence of mass or hydronephrosis.  Left kidney:  9.2 cm in length; normal in size, configuration and parenchymal echogenicity.  No evidence of mass or hydronephrosis.  Abdominal Aorta:  Normal in caliber; no aneurysm identified.  Not well characterized distally due to overlying  bowel gas.  IMPRESSION:  1. Gallbladder wall thickening to 0.5 cm, with mild gallbladder distension and a positive ultrasonographic Murphy's sign. Scattered stones seen layering dependently in the gallbladder. This raises concern for mild acute cholecystitis, though no pericholecystic fluid is seen.  Borderline prominence of the common hepatic duct. 2.  Likely fatty infiltration within the liver.  Apparent mild dilatation of the intrahepatic biliary ducts.   Original Report Authenticated By: Tonia Ghent, M.D.      1. Acute calculous cholecystitis   2. Abdominal  pain, other specified site   3. Nausea and vomiting       MDM  Valerie Cooper is a 79 y.o. female with a history of symptomatic cholelithiasis who saw surgery last year and was supposed to schedule an elective cholecystectomy, but cannot find a convenient time, presents with right upper quadrant pain after  eating some cake today. Patient is nontoxic in appearance but has a positive Murphy sign with presentation overall concerning for acute cholecystitis. Her quadrant ultrasound shows once again cholelithiasis some mild gallbladder wall thickening no pericholecystic fluid, her LFTs are unremarkable, white blood cell count is mildly elevated at 16.7-whether this is because of vomiting or gallbladder, is uncertain, patient given 2 g cefotetan, kept n.p.o., kept on IV fluids. Will replace potassium.  Dr Carolynne Edouard consulted for admission for cholecystectomy.  Pt stable, pain well controlled, stable for admission.       Jones Skene, MD 05/29/12 1610

## 2012-05-29 NOTE — Preoperative (Signed)
Beta Blockers   Reason not to administer Beta Blockers:Not Applicable 

## 2012-05-29 NOTE — Progress Notes (Signed)
I reviewed the chart and patient's history.  She has symptomatic gallstones.  Will plan cholecystectomy later today.  I discussed with the patient the indications and risks of gall bladder surgery.  The primary risks of gall bladder surgery include, but are not limited to, bleeding, infection, common bile duct injury, and open surgery.  There is also the risk that the patient may have continued symptoms after surgery.  However, the likelihood of improvement in symptoms and return to the patient's normal status is good. We discussed the typical post-operative recovery course. I tried to answer the patient's questions.  Widowed.  Has [place], Duanne Moron.  Has one daughter, who lives in [place].  She has another sister who had a complication of gall bladder surgery in which the bowel was injured.  Ovidio Kin, MD, [facility] Surgery Pager: [phone] Office phone:  [phone]

## 2012-05-29 NOTE — ED Notes (Signed)
Attempted to call report. Floor RN unable to accept report.  

## 2012-05-29 NOTE — Anesthesia Procedure Notes (Signed)
Procedure Name: Intubation Date/Time: 05/29/2012 12:20 PM Performed by: Margaree Mackintosh Pre-anesthesia Checklist: Patient identified, Timeout performed, Emergency Drugs available, Suction available and Patient being monitored Patient Re-evaluated:Patient Re-evaluated prior to inductionOxygen Delivery Method: Circle system utilized Preoxygenation: Pre-oxygenation with 100% oxygen Intubation Type: IV induction Ventilation: Mask ventilation without difficulty and Oral airway inserted - appropriate to patient size Laryngoscope Size: Mac and 4 Grade View: Grade I Tube type: Oral Tube size: 7.5 mm Number of attempts: 1 Airway Equipment and Method: Stylet Placement Confirmation: ETT inserted through vocal cords under direct vision,  positive ETCO2 and breath sounds checked- equal and bilateral Secured at: 21 cm Tube secured with: Tape Dental Injury: Teeth and Oropharynx as per pre-operative assessment

## 2012-05-29 NOTE — ED Notes (Signed)
Patient transported to Ultrasound 

## 2012-05-29 NOTE — H&P (Signed)
Valerie Cooper is an 79 y.o. female.   Chief Complaint: abdominal pain HPI: 79 yo bf who began having abdominal pain on Saturday. Pain has been persistent. It is associated with nausea and vomiting. She denies fever or chills. She had pain just like this last summer but did not have surgery.  Past Medical History  Diagnosis Date  . Ulcer 1980s    PUD ?due to ASA intake  . Hyperlipidemia   . Hypertension   . H/O colonoscopy 2005  . Shingles 2001    right leg  . Cataract   . DJD (degenerative joint disease)   . Osteoporosis     Past Surgical History  Procedure Laterality Date  . Abdominal hysterectomy  1975  . Rotator cuff repair  2005    4 surgeries    Family History  Problem Relation Age of Onset  . Stroke Mother   . Stroke Father    Social History:  reports that she has never smoked. She does not have any smokeless tobacco history on file. She reports that she does not drink alcohol or use illicit drugs.  Allergies: No Known Allergies   (Not in a hospital admission)  Results for orders placed during the hospital encounter of 05/29/12 (from the past 48 hour(s))  CBC WITH DIFFERENTIAL     Status: Abnormal   Collection Time    05/29/12  1:10 AM      Result Value Range   WBC 16.7 (*) 4.0 - 10.5 K/uL   RBC 3.74 (*) 3.87 - 5.11 MIL/uL   Hemoglobin 11.9 (*) 12.0 - 15.0 g/dL   HCT 96.0 (*) 45.4 - 09.8 %   MCV 92.5  78.0 - 100.0 fL   MCH 31.8  26.0 - 34.0 pg   MCHC 34.4  30.0 - 36.0 g/dL   RDW 11.9  14.7 - 82.9 %   Platelets 286  150 - 400 K/uL   Neutrophils Relative 83 (*) 43 - 77 %   Neutro Abs 13.9 (*) 1.7 - 7.7 K/uL   Lymphocytes Relative 12  12 - 46 %   Lymphs Abs 2.0  0.7 - 4.0 K/uL   Monocytes Relative 5  3 - 12 %   Monocytes Absolute 0.8  0.1 - 1.0 K/uL   Eosinophils Relative 0  0 - 5 %   Eosinophils Absolute 0.0  0.0 - 0.7 K/uL   Basophils Relative 0  0 - 1 %   Basophils Absolute 0.0  0.0 - 0.1 K/uL  COMPREHENSIVE METABOLIC PANEL     Status: Abnormal   Collection Time    05/29/12  1:10 AM      Result Value Range   Sodium 136  135 - 145 mEq/L   Potassium 3.0 (*) 3.5 - 5.1 mEq/L   Chloride 97  96 - 112 mEq/L   CO2 26  19 - 32 mEq/L   Glucose, Bld 155 (*) 70 - 99 mg/dL   BUN 28 (*) 6 - 23 mg/dL   Creatinine, Ser 5.62  0.50 - 1.10 mg/dL   Calcium 9.9  8.4 - 13.0 mg/dL   Total Protein 8.0  6.0 - 8.3 g/dL   Albumin 4.1  3.5 - 5.2 g/dL   AST 20  0 - 37 U/L   ALT 15  0 - 35 U/L   Alkaline Phosphatase 40  39 - 117 U/L   Total Bilirubin 0.2 (*) 0.3 - 1.2 mg/dL   GFR calc non Af Amer 52 (*) >90  mL/min   GFR calc Af Amer 60 (*) >90 mL/min   Comment:            The eGFR has been calculated     using the CKD EPI equation.     This calculation has not been     validated in all clinical     situations.     eGFR's persistently     <90 mL/min signify     possible Chronic Kidney Disease.  LIPASE, BLOOD     Status: None   Collection Time    05/29/12  1:10 AM      Result Value Range   Lipase 28  11 - 59 U/L  URINALYSIS, ROUTINE W REFLEX MICROSCOPIC     Status: Abnormal   Collection Time    05/29/12  2:35 AM      Result Value Range   Color, Urine YELLOW  YELLOW   APPearance CLEAR  CLEAR   Specific Gravity, Urine 1.022  1.005 - 1.030   pH 6.5  5.0 - 8.0   Glucose, UA NEGATIVE  NEGATIVE mg/dL   Hgb urine dipstick NEGATIVE  NEGATIVE   Bilirubin Urine NEGATIVE  NEGATIVE   Ketones, ur NEGATIVE  NEGATIVE mg/dL   Protein, ur NEGATIVE  NEGATIVE mg/dL   Urobilinogen, UA 0.2  0.0 - 1.0 mg/dL   Nitrite NEGATIVE  NEGATIVE   Leukocytes, UA TRACE (*) NEGATIVE  URINE MICROSCOPIC-ADD ON     Status: None   Collection Time    05/29/12  2:35 AM      Result Value Range   Squamous Epithelial / LPF RARE  RARE   WBC, UA 0-2  <3 WBC/hpf   RBC / HPF 0-2  <3 RBC/hpf   Bacteria, UA RARE  RARE   Urine-Other LESS THAN 10 mL OF URINE SUBMITTED     US Abdomen Complete  05/29/2012  *RADIOLOGY REPORT*  Clinical Data:  Right upper quadrant abdominal pain.   ABDOMINAL ULTRASOUND COMPLETE  Comparison:  Abdominal ultrasound performed 09/22/2011  Findings:  Gallbladder: There is diffuse gallbladder wall thickening, measuring 0.5 cm, with scattered stones seen layering dependently in the gallbladder, mild gallbladder distension and a positive ultrasonographic Murphy's sign.  This raises concern for mild acute cholecystitis, though no pericholecystic fluid is seen.  Common Bile Duct:  0.9 cm in diameter; borderline prominent for the patient's age.  Liver:  Mildly heterogeneous echogenicity and coarsened echotexture, raising concern for fatty infiltration; no focal lesions identified.  There is apparent mild dilatation of the intrahepatic biliary ducts.  Limited Doppler evaluation demonstrates normal blood flow within the liver.  IVC:  Unremarkable in appearance.  Pancreas:  Although the pancreas is difficult to visualize in its entirety due to overlying bowel gas, no focal pancreatic abnormality is identified.  Spleen:  6.9 cm in length; within normal limits in size and echotexture.  Right kidney:  9.4 cm in length; normal in size, configuration and parenchymal echogenicity.  No evidence of mass or hydronephrosis.  Left kidney:  9.2 cm in length; normal in size, configuration and parenchymal echogenicity.  No evidence of mass or hydronephrosis.  Abdominal Aorta:  Normal in caliber; no aneurysm identified.  Not well characterized distally due to overlying bowel gas.  IMPRESSION:  1. Gallbladder wall thickening to 0.5 cm, with mild gallbladder distension and a positive ultrasonographic Murphy's sign. Scattered stones seen layering dependently in the gallbladder. This raises concern for mild acute cholecystitis, though no pericholecystic fluid is seen.  Borderline prominence  of the common hepatic duct. 2.  Likely fatty infiltration within the liver.  Apparent mild dilatation of the intrahepatic biliary ducts.   Original Report Authenticated By: Tonia Ghent, M.D.     Review  of Systems  Constitutional: Negative.   HENT: Negative.   Eyes: Negative.   Respiratory: Negative.   Cardiovascular: Negative.   Gastrointestinal: Positive for nausea, vomiting and abdominal pain.  Genitourinary: Negative.   Musculoskeletal: Negative.   Skin: Negative.   Neurological: Negative.   Endo/Heme/Allergies: Negative.   Psychiatric/Behavioral: Negative.     Blood pressure 142/62, pulse 96, temperature 98.5 F (36.9 C), temperature source Oral, resp. rate 16, SpO2 97.00%. Physical Exam  Constitutional: She is oriented to person, place, and time. She appears well-developed and well-nourished.  HENT:  Head: Normocephalic and atraumatic.  Eyes: Conjunctivae and EOM are normal. Pupils are equal, round, and reactive to light.  Neck: Normal range of motion. Neck supple.  Cardiovascular: Normal rate, regular rhythm and normal heart sounds.   Respiratory: Effort normal and breath sounds normal.  GI: Soft. Bowel sounds are normal.  Mild to moderate tenderness in RUQ with no guarding or peritonitis  Musculoskeletal: Normal range of motion.  Neurological: She is alert and oriented to person, place, and time.  Skin: Skin is warm and dry.  Psychiatric: She has a normal mood and affect. Her behavior is normal.     Assessment/Plan Pt appears to have cholecystitis with cholelithiasis that is symptomatic. I think she would probably benefit from having her gallbladder removed during this hospitalization since this is a recurring problem for her. Will admit and start on IV abx.  TOTH III,PAUL S 05/29/2012, 5:28 AM

## 2012-05-29 NOTE — ED Notes (Signed)
Pt ambulated to restroom without difficulty and steady gait 

## 2012-05-29 NOTE — Transfer of Care (Signed)
Immediate Anesthesia Transfer of Care Note  Patient: Valerie Cooper  Procedure(s) Performed: Procedure(s): LAPAROSCOPIC CHOLECYSTECTOMY WITH INTRAOPERATIVE CHOLANGIOGRAM (N/A)  Patient Location: PACU  Anesthesia Type:General  Level of Consciousness: awake  Airway & Oxygen Therapy: Patient Spontanous Breathing and Patient connected to face mask oxygen  Post-op Assessment: Report given to PACU RN and Post -op Vital signs reviewed and stable  Post vital signs: Reviewed and stable  Complications: No apparent anesthesia complications

## 2012-05-29 NOTE — ED Notes (Signed)
Pt arrived from home c/o upper right abdominal pain, that fels like her gall bladder is bothering her again. N/V x 1

## 2012-05-29 NOTE — Anesthesia Postprocedure Evaluation (Signed)
  Anesthesia Post-op Note  Patient: Valerie Cooper  Procedure(s) Performed: Procedure(s): LAPAROSCOPIC CHOLECYSTECTOMY WITH INTRAOPERATIVE CHOLANGIOGRAM (N/A)  Patient Location: PACU  Anesthesia Type:General  Level of Consciousness: awake  Airway and Oxygen Therapy: Patient Spontanous Breathing  Post-op Pain: mild  Post-op Assessment: Post-op Vital signs reviewed  Post-op Vital Signs: stable  Complications: No apparent anesthesia complications

## 2012-05-29 NOTE — Anesthesia Preprocedure Evaluation (Addendum)
Anesthesia Evaluation  Patient identified by MRN, date of birth, ID band Patient awake  General Assessment Comment:Likely not allergy to anectine but sensitivity to NMD agents  Reviewed: Allergy & Precautions, H&P , NPO status , Patient's Chart, lab work & pertinent test results, reviewed documented beta blocker date and time   History of Anesthesia Complications (+) PSEUDOCHOLINESTERASE DEFICIENCY  Airway Mallampati: II TM Distance: >3 FB Neck ROM: Full    Dental  (+) Poor Dentition, Missing and Dental Advisory Given   Pulmonary neg pulmonary ROS,  breath sounds clear to auscultation        Cardiovascular hypertension, Pt. on medications Rhythm:Regular Rate:Normal     Neuro/Psych negative neurological ROS     GI/Hepatic Neg liver ROS, PUD,   Endo/Other  negative endocrine ROSMorbid obesity  Renal/GU negative Renal ROS     Musculoskeletal  (+) Arthritis -, Osteoarthritis,    Abdominal   Peds  Hematology  (+) Blood dyscrasia, anemia ,   Anesthesia Other Findings   Reproductive/Obstetrics                        Anesthesia Physical Anesthesia Plan  ASA: II  Anesthesia Plan: General   Post-op Pain Management:    Induction:   Airway Management Planned: Oral ETT  Additional Equipment:   Intra-op Plan:   Post-operative Plan: Extubation in OR  Informed Consent: I have reviewed the patients History and Physical, chart, labs and discussed the procedure including the risks, benefits and alternatives for the proposed anesthesia with the patient or authorized representative who has indicated his/her understanding and acceptance.   Dental advisory given  Plan Discussed with: CRNA, Anesthesiologist and Surgeon  Anesthesia Plan Comments:        Anesthesia Quick Evaluation

## 2012-05-30 ENCOUNTER — Encounter (HOSPITAL_COMMUNITY): Payer: Self-pay | Admitting: General Practice

## 2012-05-30 LAB — COMPREHENSIVE METABOLIC PANEL
AST: 163 U/L — ABNORMAL HIGH (ref 0–37)
Albumin: 3 g/dL — ABNORMAL LOW (ref 3.5–5.2)
Alkaline Phosphatase: 50 U/L (ref 39–117)
BUN: 13 mg/dL (ref 6–23)
CO2: 26 mEq/L (ref 19–32)
Chloride: 103 mEq/L (ref 96–112)
Creatinine, Ser: 0.95 mg/dL (ref 0.50–1.10)
GFR calc non Af Amer: 57 mL/min — ABNORMAL LOW (ref >90)
Potassium: 3.3 mEq/L — ABNORMAL LOW (ref 3.5–5.1)
Total Bilirubin: 0.8 mg/dL (ref 0.3–1.2)

## 2012-05-30 MED ORDER — OXYCODONE-ACETAMINOPHEN 5-325 MG PO TABS: 1.0000 | ORAL_TABLET | Freq: Four times a day (QID) | ORAL | Status: DC | PRN

## 2012-05-30 MED ORDER — MUPIROCIN 2 % EX OINT
1.0000 "application " | TOPICAL_OINTMENT | Freq: Two times a day (BID) | CUTANEOUS | Status: DC
  Administered 2012-05-30: 1 via NASAL
  Filled 2012-05-30: qty 22

## 2012-05-30 MED ORDER — CHLORHEXIDINE GLUCONATE CLOTH 2 % EX PADS
6.0000 | MEDICATED_PAD | Freq: Every day | CUTANEOUS | Status: DC
  Administered 2012-05-30: 6 via TOPICAL

## 2012-05-30 MED ORDER — OXYCODONE-ACETAMINOPHEN 5-325 MG PO TABS: 1.0000 | ORAL_TABLET | ORAL | Status: DC | PRN

## 2012-05-30 NOTE — Progress Notes (Signed)
Discharge instructions completed with patient. Prescriptions given. Patient asked when she could return to work. This information was not filled out on her discharge instructions so she was informed to call the office of [facility] tomorrow to ask the MD about returning to work.  All other questions answered. Patient discharged to a friends car in a wheelchair.

## 2012-05-30 NOTE — Progress Notes (Signed)
Utilization review completed. Shadrach Bartunek, RN, BSN. 

## 2012-05-30 NOTE — Discharge Summary (Signed)
Patient ID: Valerie Cooper MRN: [id number] DOB/AGE: [birth year] 79 y.o.  Admit date: 05/29/2012 Discharge date: 05/30/2012  Procedures: laparoscopic cholecystectomy with IOC - D. Kaylina Cahue - 05/29/2012  Consults: None  Reason for Admission: 79 yo bf who began having abdominal pain on Saturday. Pain has been persistent. It is associated with nausea and vomiting. She denies fever or chills. She had pain just like this last summer but did not have surgery.  She was seen by Dr. Demetrius Charity. Carolynne Edouard in the [facility] ER and admitted for cholecystitis.  Admission Diagnoses: 1. Acute cholecystitis 2. HTN  Hospital Course: The patient was admitted.  She was taken to the OR where she underwent a lap chole with IOC.  She tolerated this procedure well.  There was a concern on her IOC for either a spasm or distal CBD stone.  Her follow up LFTs showed some elevation of her AST and ALT, but no elevation of her TB or AlkPh.    She was tolerating a regular diet and her pain was well controlled on POD#1.  She was stable for dc home.  Discharge Diagnoses:  1. Acute cholecystitis, s/p lap chole with IOC - D. Prima Rayner - 05/29/2012  Discharge Medications:   Medication List    TAKE these medications       atorvastatin 20 MG tablet  Commonly known as:  LIPITOR  Take 20 mg by mouth daily.     losartan-hydrochlorothiazide 100-25 MG per tablet  Commonly known as:  HYZAAR  Take 1 tablet by mouth daily.     oxyCODONE-acetaminophen 5-325 MG per tablet  Commonly known as:  PERCOCET/ROXICET  Take 1-2 tablets by mouth every 4 (four) hours as needed.        Discharge Instructions:     Follow-up Information   Follow up with [facility] On 06/13/2012. (1:00pm, arrive at 12:30pm)    Contact information:   [address]   [place] [address] [phone]       Signed: Letha Cape 05/30/2012, 1:33 PM  Agree with above.  Ovidio Kin, MD, [facility] Surgery Pager: [phone] Office  phone:  [phone]

## 2012-05-30 NOTE — Progress Notes (Signed)
Patient ID: Valerie Cooper, female   DOB: 1934/03/12, 79 y.o.   MRN: [id number] 1 Day Post-Op  Subjective: Pt feels ok this morning.  C/o some soreness with movement.  Tolerating clear liquids.  Objective: Vital signs in last 24 hours: Temp:  [96.9 F (36.1 C)-99.7 F (37.6 C)] 99.7 F (37.6 C) (02/11 0600) Pulse Rate:  [73-100] 89 (02/11 0600) Resp:  [13-20] 18 (02/11 0600) BP: (118-134)/(50-94) 125/56 mmHg (02/11 0600) SpO2:  [90 %-100 %] 98 % (02/11 0600) Last BM Date: 05/28/12  Intake/Output from previous day: 02/10 0701 - 02/11 0700 In: 1200 [I.V.:1200] Out: 50 [Blood:50] Intake/Output this shift:    PE: Abd: soft, appropriately tender, +BS, ND, incisions c/d/i with dermabond  Lab Results:   Recent Labs  05/29/12 0110  WBC 16.7*  HGB 11.9*  HCT 34.6*  PLT 286   BMET  Recent Labs  05/29/12 0110  NA 136  K 3.0*  CL 97  CO2 26  GLUCOSE 155*  BUN 28*  CREATININE 1.02  CALCIUM 9.9   PT/INR No results found for this basename: LABPROT, INR,  in the last 72 hours CMP     Component Value Date/Time   NA 136 05/29/2012 0110   K 3.0* 05/29/2012 0110   CL 97 05/29/2012 0110   CO2 26 05/29/2012 0110   GLUCOSE 155* 05/29/2012 0110   BUN 28* 05/29/2012 0110   CREATININE 1.02 05/29/2012 0110   CALCIUM 9.9 05/29/2012 0110   PROT 8.0 05/29/2012 0110   ALBUMIN 4.1 05/29/2012 0110   AST 20 05/29/2012 0110   ALT 15 05/29/2012 0110   ALKPHOS 40 05/29/2012 0110   BILITOT 0.2* 05/29/2012 0110   GFRNONAA 52* 05/29/2012 0110   GFRAA 60* 05/29/2012 0110   Lipase     Component Value Date/Time   LIPASE 28 05/29/2012 0110       Studies/Results: Dg Cholangiogram Operative  05/29/2012  *RADIOLOGY REPORT*  Clinical Data:   Cholecystectomy, cholelithiasis.  INTRAOPERATIVE CHOLANGIOGRAM  Technique:  Cholangiographic images from the C-arm fluoroscopic device were submitted for interpretation post-operatively.  Please see the procedural report for the amount of contrast and the  fluoroscopy time utilized.  Comparison:  None  Findings:  No persistent filling defects in the common duct. Intrahepatic ducts are incompletely visualized, appearing decompressed centrally. The distal CBD was not visualized.  No definite contrast passes into the duodenum.  IMPRESSION  Incomplete assessment of distal CBD for obstructing calculi.   Original Report Authenticated By: D. Andria Rhein, MD    US Abdomen Complete  05/29/2012  *RADIOLOGY REPORT*  Clinical Data:  Right upper quadrant abdominal pain.  ABDOMINAL ULTRASOUND COMPLETE  Comparison:  Abdominal ultrasound performed 09/22/2011  Findings:  Gallbladder: There is diffuse gallbladder wall thickening, measuring 0.5 cm, with scattered stones seen layering dependently in the gallbladder, mild gallbladder distension and a positive ultrasonographic Murphy's sign.  This raises concern for mild acute cholecystitis, though no pericholecystic fluid is seen.  Common Bile Duct:  0.9 cm in diameter; borderline prominent for the patient's age.  Liver:  Mildly heterogeneous echogenicity and coarsened echotexture, raising concern for fatty infiltration; no focal lesions identified.  There is apparent mild dilatation of the intrahepatic biliary ducts.  Limited Doppler evaluation demonstrates normal blood flow within the liver.  IVC:  Unremarkable in appearance.  Pancreas:  Although the pancreas is difficult to visualize in its entirety due to overlying bowel gas, no focal pancreatic abnormality is identified.  Spleen:  6.9 cm in length; within  normal limits in size and echotexture.  Right kidney:  9.4 cm in length; normal in size, configuration and parenchymal echogenicity.  No evidence of mass or hydronephrosis.  Left kidney:  9.2 cm in length; normal in size, configuration and parenchymal echogenicity.  No evidence of mass or hydronephrosis.  Abdominal Aorta:  Normal in caliber; no aneurysm identified.  Not well characterized distally due to overlying bowel gas.   IMPRESSION:  1. Gallbladder wall thickening to 0.5 cm, with mild gallbladder distension and a positive ultrasonographic Murphy's sign. Scattered stones seen layering dependently in the gallbladder. This raises concern for mild acute cholecystitis, though no pericholecystic fluid is seen.  Borderline prominence of the common hepatic duct. 2.  Likely fatty infiltration within the liver.  Apparent mild dilatation of the intrahepatic biliary ducts.   Original Report Authenticated By: Tonia Ghent, M.D.     Anti-infectives: Anti-infectives   Start     Dose/Rate Route Frequency Ordered Stop   05/29/12 1210  ceFAZolin (ANCEF) 2-3 GM-% IVPB SOLR    Comments:  MUELLER, TOM: cabinet override      05/29/12 1210 05/29/12 1243   05/29/12 0330  cefoTEtan (CEFOTAN) 2 g in dextrose 5 % 50 mL IVPB     2 g 100 mL/hr over 30 Minutes Intravenous  Once 05/29/12 0307 05/29/12 0353   05/29/12 0315  cefoTEtan (CEFOTAN) 2 g in dextrose 5 % 50 mL IVPB  Status:  Discontinued     2 g 100 mL/hr over 30 Minutes Intravenous Every 12 hours 05/29/12 0301 05/29/12 0310       Assessment/Plan  1. S/p lap chole - D. Shafter Jupin - 05/29/2012  Plan: 1. IOC showed a little bit of emptying into the duodenum, but required glucagon. ? Spasm.  Didn't really see a stone on IOC.    Will check LFTs today and see how they look 2. If LFTs ok and she tolerates her diet for lunch, then hopefully she can go home this afternoon.   LOS: 1 day    OSBORNE,KELLY E 05/30/2012, 8:15 AM Pager: [phone]  Agree with above. Labs turned out okay.  Then patient was sent home.  Ovidio Kin, MD, [facility] Pager: [phone] Office phone:  [phone]

## 2012-06-13 ENCOUNTER — Encounter (INDEPENDENT_AMBULATORY_CARE_PROVIDER_SITE_OTHER): Payer: Self-pay | Admitting: General Surgery

## 2012-06-13 ENCOUNTER — Encounter (INDEPENDENT_AMBULATORY_CARE_PROVIDER_SITE_OTHER): Payer: Self-pay | Admitting: Internal Medicine

## 2012-06-13 ENCOUNTER — Ambulatory Visit (INDEPENDENT_AMBULATORY_CARE_PROVIDER_SITE_OTHER): Payer: Medicare Other | Admitting: Internal Medicine

## 2012-06-13 VITALS — BP 112/64 | HR 80 | Temp 96.3°F | Resp 18 | Ht 61.0 in | Wt 163.2 lb

## 2012-06-13 DIAGNOSIS — K8000 Calculus of gallbladder with acute cholecystitis without obstruction: Secondary | ICD-10-CM

## 2012-06-13 NOTE — Progress Notes (Signed)
  Subjective: Pt returns to the clinic today after undergoing laparoscopic cholecystectomy on 05/29/12.  The patient is tolerating their diet well and is having no severe pain.  Bowel function is good.  No problems with the wounds.  Objective: Vital signs in last 24 hours: Reviewed  PE: Abd: soft, non-tender, +bs, incisions well healed  Lab Results:  No results found for this basename: WBC, HGB, HCT, PLT,  in the last 72 hours BMET No results found for this basename: NA, K, CL, CO2, GLUCOSE, BUN, CREATININE, CALCIUM,  in the last 72 hours PT/INR No results found for this basename: LABPROT, INR,  in the last 72 hours CMP     Component Value Date/Time   NA 138 05/30/2012 0838   K 3.3* 05/30/2012 0838   CL 103 05/30/2012 0838   CO2 26 05/30/2012 0838   GLUCOSE 119* 05/30/2012 0838   BUN 13 05/30/2012 0838   CREATININE 0.95 05/30/2012 0838   CALCIUM 8.5 05/30/2012 0838   PROT 6.5 05/30/2012 0838   ALBUMIN 3.0* 05/30/2012 0838   AST 163* 05/30/2012 0838   ALT 216* 05/30/2012 0838   ALKPHOS 50 05/30/2012 0838   BILITOT 0.8 05/30/2012 0838   GFRNONAA 57* 05/30/2012 0838   GFRAA 66* 05/30/2012 0838   Lipase     Component Value Date/Time   LIPASE 28 05/29/2012 0110       Studies/Results: No results found.  Anti-infectives: Anti-infectives   None       Assessment/Plan  1.  S/P Laparoscopic Cholecystectomy: doing well, may resume regular activity without restrictions, Pt will follow up with [place] PRN and knows to call with questions or concerns.     Nayvie Lips 06/13/2012

## 2012-06-13 NOTE — Patient Instructions (Signed)
May resume regular activity without restrictions. Follow up as needed. Call with questions or concerns.  

## 2013-04-18 ENCOUNTER — Ambulatory Visit
Admission: RE | Admit: 2013-04-18 | Discharge: 2013-04-18 | Disposition: A | Discharge status: Home / Self Care | Payer: BC Managed Care – PPO | Source: Ambulatory Visit | Attending: Internal Medicine | Admitting: Internal Medicine

## 2013-04-18 DIAGNOSIS — Z1231 Encounter for screening mammogram for malignant neoplasm of breast: Secondary | ICD-10-CM

## 2014-01-07 ENCOUNTER — Emergency Department (HOSPITAL_COMMUNITY): Payer: Medicare Other

## 2014-01-07 ENCOUNTER — Emergency Department (HOSPITAL_COMMUNITY)
Admission: EM | Admit: 2014-01-07 | Discharge: 2014-01-07 | Disposition: A | Discharge status: Home / Self Care | Payer: Medicare Other | Attending: Emergency Medicine | Admitting: Emergency Medicine

## 2014-01-07 ENCOUNTER — Encounter (HOSPITAL_COMMUNITY): Payer: Self-pay | Admitting: Emergency Medicine

## 2014-01-07 DIAGNOSIS — E785 Hyperlipidemia, unspecified: Secondary | ICD-10-CM | POA: Diagnosis not present

## 2014-01-07 DIAGNOSIS — R5383 Other fatigue: Secondary | ICD-10-CM

## 2014-01-07 DIAGNOSIS — I10 Essential (primary) hypertension: Secondary | ICD-10-CM | POA: Diagnosis not present

## 2014-01-07 DIAGNOSIS — R51 Headache: Secondary | ICD-10-CM | POA: Diagnosis present

## 2014-01-07 DIAGNOSIS — G4489 Other headache syndrome: Secondary | ICD-10-CM | POA: Diagnosis not present

## 2014-01-07 DIAGNOSIS — R42 Dizziness and giddiness: Secondary | ICD-10-CM | POA: Diagnosis not present

## 2014-01-07 DIAGNOSIS — R638 Other symptoms and signs concerning food and fluid intake: Secondary | ICD-10-CM | POA: Insufficient documentation

## 2014-01-07 DIAGNOSIS — J309 Allergic rhinitis, unspecified: Secondary | ICD-10-CM | POA: Diagnosis not present

## 2014-01-07 DIAGNOSIS — M1990 Unspecified osteoarthritis, unspecified site: Secondary | ICD-10-CM | POA: Diagnosis not present

## 2014-01-07 DIAGNOSIS — Z79899 Other long term (current) drug therapy: Secondary | ICD-10-CM | POA: Insufficient documentation

## 2014-01-07 DIAGNOSIS — Z8619 Personal history of other infectious and parasitic diseases: Secondary | ICD-10-CM | POA: Diagnosis not present

## 2014-01-07 DIAGNOSIS — Z872 Personal history of diseases of the skin and subcutaneous tissue: Secondary | ICD-10-CM | POA: Insufficient documentation

## 2014-01-07 DIAGNOSIS — R5381 Other malaise: Secondary | ICD-10-CM | POA: Insufficient documentation

## 2014-01-07 LAB — CBG MONITORING, ED: GLUCOSE-CAPILLARY: 97 mg/dL (ref 70–99)

## 2014-01-07 LAB — MAGNESIUM: Magnesium: 1.6 mg/dL (ref 1.5–2.5)

## 2014-01-07 LAB — COMPREHENSIVE METABOLIC PANEL WITH GFR
ALT: 17 U/L (ref 0–35)
AST: 18 U/L (ref 0–37)
Albumin: 4.3 g/dL (ref 3.5–5.2)
Alkaline Phosphatase: 42 U/L (ref 39–117)
Anion gap: 12 (ref 5–15)
BUN: 26 mg/dL — ABNORMAL HIGH (ref 6–23)
CO2: 27 meq/L (ref 19–32)
Calcium: 9.3 mg/dL (ref 8.4–10.5)
Chloride: 100 meq/L (ref 96–112)
Creatinine, Ser: 1.11 mg/dL — ABNORMAL HIGH (ref 0.50–1.10)
GFR calc Af Amer: 54 mL/min — ABNORMAL LOW
GFR calc non Af Amer: 46 mL/min — ABNORMAL LOW
Glucose, Bld: 104 mg/dL — ABNORMAL HIGH (ref 70–99)
Potassium: 3.8 meq/L (ref 3.7–5.3)
Sodium: 139 meq/L (ref 137–147)
Total Bilirubin: 0.4 mg/dL (ref 0.3–1.2)
Total Protein: 7.9 g/dL (ref 6.0–8.3)

## 2014-01-07 LAB — CBC WITH DIFFERENTIAL/PLATELET
BASOS ABS: 0 10*3/uL (ref 0.0–0.1)
BASOS PCT: 0 % (ref 0–1)
EOS PCT: 0 % (ref 0–5)
Eosinophils Absolute: 0.1 10*3/uL (ref 0.0–0.7)
HEMATOCRIT: 34.1 % — AB (ref 36.0–46.0)
Hemoglobin: 11.9 g/dL — ABNORMAL LOW (ref 12.0–15.0)
Lymphocytes Relative: 22 % (ref 12–46)
Lymphs Abs: 2.6 10*3/uL (ref 0.7–4.0)
MCH: 32.2 pg (ref 26.0–34.0)
MCHC: 34.9 g/dL (ref 30.0–36.0)
MCV: 92.2 fL (ref 78.0–100.0)
MONO ABS: 0.8 10*3/uL (ref 0.1–1.0)
Monocytes Relative: 6 % (ref 3–12)
Neutro Abs: 8.2 10*3/uL — ABNORMAL HIGH (ref 1.7–7.7)
Neutrophils Relative %: 72 % (ref 43–77)
Platelets: 283 10*3/uL (ref 150–400)
RBC: 3.7 MIL/uL — ABNORMAL LOW (ref 3.87–5.11)
RDW: 12.9 % (ref 11.5–15.5)
WBC: 11.6 10*3/uL — ABNORMAL HIGH (ref 4.0–10.5)

## 2014-01-07 LAB — URINALYSIS, ROUTINE W REFLEX MICROSCOPIC
Bilirubin Urine: NEGATIVE
Glucose, UA: NEGATIVE mg/dL
Ketones, ur: NEGATIVE mg/dL
NITRITE: NEGATIVE
PH: 5.5 (ref 5.0–8.0)
Protein, ur: NEGATIVE mg/dL
SPECIFIC GRAVITY, URINE: 1.015 (ref 1.005–1.030)
UROBILINOGEN UA: 0.2 mg/dL (ref 0.0–1.0)

## 2014-01-07 LAB — TROPONIN I

## 2014-01-07 LAB — URINE MICROSCOPIC-ADD ON

## 2014-01-07 LAB — PRO B NATRIURETIC PEPTIDE: Pro B Natriuretic peptide (BNP): 38 pg/mL (ref 0–450)

## 2014-01-07 MED ORDER — MOMETASONE FUROATE 50 MCG/ACT NA SUSP: 2.0000 | Freq: Every day | NASAL | Status: DC

## 2014-01-07 MED ORDER — OXYMETAZOLINE HCL 0.05 % NA SOLN: 1.0000 | Freq: Two times a day (BID) | NASAL | Status: DC

## 2014-01-07 MED ORDER — LORATADINE 10 MG PO TABS: 10.0000 mg | ORAL_TABLET | Freq: Every day | ORAL | Status: DC

## 2014-01-07 NOTE — ED Notes (Signed)
MD at bedside. 

## 2014-01-07 NOTE — ED Notes (Signed)
The pt has not felt well for 2 weeks .  She has a panic attack Friday night.  Tonight she has had a headache.  Her headache is not going away after tylenol.  She lives by herself and she wa scared

## 2014-01-07 NOTE — Discharge Instructions (Signed)
Allergic Rhinitis Allergic rhinitis is when the mucous membranes in the nose respond to allergens. Allergens are particles in the air that cause your body to have an allergic reaction. This causes you to release allergic antibodies. Through a chain of events, these eventually cause you to release histamine into the blood stream. Although meant to protect the body, it is this release of histamine that causes your discomfort, such as frequent sneezing, congestion, and an itchy, runny nose.  CAUSES  Seasonal allergic rhinitis (hay fever) is caused by pollen allergens that may come from grasses, trees, and weeds. Year-round allergic rhinitis (perennial allergic rhinitis) is caused by allergens such as house dust mites, pet dander, and mold spores.  SYMPTOMS   Nasal stuffiness (congestion).  Itchy, runny nose with sneezing and tearing of the eyes. DIAGNOSIS  Your health care provider can help you determine the allergen or allergens that trigger your symptoms. If you and your health care provider are unable to determine the allergen, skin or blood testing may be used. TREATMENT  Allergic rhinitis does not have a cure, but it can be controlled by:  Medicines and allergy shots (immunotherapy).  Avoiding the allergen. Hay fever may often be treated with antihistamines in pill or nasal spray forms. Antihistamines block the effects of histamine. There are over-the-counter medicines that may help with nasal congestion and swelling around the eyes. Check with your health care provider before taking or giving this medicine.  If avoiding the allergen or the medicine prescribed do not work, there are many new medicines your health care provider can prescribe. Stronger medicine may be used if initial measures are ineffective. Desensitizing injections can be used if medicine and avoidance does not work. Desensitization is when a patient is given ongoing shots until the body becomes less sensitive to the allergen.  Make sure you follow up with your health care provider if problems continue. HOME CARE INSTRUCTIONS It is not possible to completely avoid allergens, but you can reduce your symptoms by taking steps to limit your exposure to them. It helps to know exactly what you are allergic to so that you can avoid your specific triggers. SEEK MEDICAL CARE IF:   You have a fever.  You develop a cough that does not stop easily (persistent).  You have shortness of breath.  You start wheezing.  Symptoms interfere with normal daily activities. Document Released: 12/29/2000 Document Revised: 04/10/2013 Document Reviewed: 12/11/2012 [facility] Patient Information 2015 [place], [place]. This information is not intended to replace advice given to you by your health care provider. Make sure you discuss any questions you have with your health care provider.  Dizziness  Dizziness means you feel unsteady or lightheaded. You might feel like you are going to pass out (faint). HOME CARE   Drink enough fluids to keep your pee (urine) clear or pale yellow.  Take your medicines exactly as told by your doctor. If you take blood pressure medicine, always stand up slowly from the lying or sitting position. Hold on to something to steady yourself.  If you need to stand in one place for a long time, move your legs often. Tighten and relax your leg muscles.  Have someone stay with you until you feel okay.  Do not drive or use heavy machinery if you feel dizzy.  Do not drink alcohol. GET HELP RIGHT AWAY IF:   You feel dizzy or lightheaded and it gets worse.  You feel sick to your stomach (nauseous), or you throw up (vomit).  You have trouble talking or walking.  You feel weak or have trouble using your arms, hands, or legs.  You cannot think clearly or have trouble forming sentences.  You have chest pain, belly (abdominal) pain, sweating, or you are short of breath.  Your vision changes.  You are  bleeding.  You have problems from your medicine that seem to be getting worse. MAKE SURE YOU:   Understand these instructions.  Will watch your condition.  Will get help right away if you are not doing well or get worse. Document Released: 03/25/2011 Document Revised: 06/28/2011 Document Reviewed: 03/25/2011 [facility] Patient Information 2015 [place], [place]. This information is not intended to replace advice given to you by your health care provider. Make sure you discuss any questions you have with your health care provider.

## 2014-01-07 NOTE — ED Provider Notes (Signed)
CSN: [id number]     Arrival date & time 01/07/14  0007 History   First MD Initiated Contact with Patient 01/07/14 [phone]     Chief Complaint  Patient presents with  . Headache     (Consider location/radiation/quality/duration/timing/severity/associated sxs/prior Treatment) HPI Patient presents with several days of lightheadedness and fatigue. She states she's had decreased appetite. She complains of mild facial pressure and nasal congestion. Denies any neck pain or stiffness. She has no shortness of breath or chest pain. She's had no abdominal pain, nausea or vomiting. She denies any new urinary symptoms. Patient states the lightheadedness is episodic. It is not a similar associated with position change. She states when she has these episodes that she feels that she may pass out. She currently is asymptomatic. She's had no fever or chills. Past Medical History  Diagnosis Date  . Ulcer 1980s    PUD ?due to ASA intake  . Hyperlipidemia   . Hypertension   . H/O colonoscopy 2005  . Shingles 2001    right leg  . Cataract   . DJD (degenerative joint disease)   . Osteoporosis   . Complication of anesthesia     It takes awhile for me to wake up "  . Family history of anesthesia complication    Past Surgical History  Procedure Laterality Date  . Abdominal hysterectomy  1975  . Rotator cuff repair  2005    4 surgeries  . Cholecystectomy  05/29/2012    Dr Lucia Gaskins  . Cholecystectomy N/A 05/29/2012    Procedure: LAPAROSCOPIC CHOLECYSTECTOMY WITH INTRAOPERATIVE CHOLANGIOGRAM;  Surgeon: Shann Medal, MD;  Location: [facility] OR;  Service: General;  Laterality: N/A;   Family History  Problem Relation Age of Onset  . Stroke Mother   . Stroke Father    History  Substance Use Topics  . Smoking status: Never Smoker   . Smokeless tobacco: Never Used  . Alcohol Use: No   OB History   Grav Para Term Preterm Abortions TAB SAB Ect Mult Living                 Review of Systems  Constitutional:  Positive for appetite change and fatigue. Negative for fever and chills.  HENT: Positive for congestion and sinus pressure. Negative for rhinorrhea and sore throat.   Respiratory: Negative for cough and shortness of breath.   Cardiovascular: Negative for chest pain, palpitations and leg swelling.  Gastrointestinal: Negative for nausea, vomiting, abdominal pain, diarrhea and constipation.  Genitourinary: Negative for dysuria, frequency and flank pain.  Musculoskeletal: Negative for back pain, myalgias, neck pain and neck stiffness.  Skin: Negative for rash and wound.  Neurological: Positive for dizziness, light-headedness and headaches. Negative for seizures, syncope, weakness and numbness.  Psychiatric/Behavioral: Positive for sleep disturbance.  All other systems reviewed and are negative.     Allergies  Review of patient's allergies indicates no known allergies.  Home Medications   Prior to Admission medications   Medication Sig Start Date End Date Taking? Authorizing Provider  acetaminophen (TYLENOL) 500 MG tablet Take 500-1,000 mg by mouth every 6 (six) hours as needed for headache.   Yes Historical Provider, MD  atorvastatin (LIPITOR) 20 MG tablet Take 20 mg by mouth daily.    Historical Provider, MD  losartan-hydrochlorothiazide (HYZAAR) 100-25 MG per tablet Take 1 tablet by mouth daily.    Historical Provider, MD   BP 161/84  Pulse 66  Temp(Src) 98.4 F (36.9 C)  Resp 12  Ht 5\' 1"  (  1.549 m)  Wt 168 lb (76.204 kg)  BMI 31.76 kg/m2  SpO2 98% Physical Exam  Nursing note and vitals reviewed. Constitutional: She is oriented to person, place, and time. She appears well-developed and well-nourished. No distress.  HENT:  Head: Normocephalic and atraumatic.  Mouth/Throat: Oropharynx is clear and moist. No oropharyngeal exudate.  No sinus tenderness. Mild nasal mucosal edema  Eyes: EOM are normal. Pupils are equal, round, and reactive to light.  Neck: Normal range of motion.  Neck supple.  Cardiovascular: Normal rate and regular rhythm.   Pulmonary/Chest: Effort normal and breath sounds normal. No respiratory distress. She has no wheezes. She has no rales. She exhibits no tenderness.  Abdominal: Soft. Bowel sounds are normal. She exhibits no distension and no mass. There is no tenderness. There is no rebound and no guarding.  Musculoskeletal: Normal range of motion. She exhibits no edema and no tenderness.  No calf swelling or tenderness.  Neurological: She is alert and oriented to person, place, and time.  Patient is alert and oriented x3 with clear, goal oriented speech. Patient has 5/5 motor in all extremities. Sensation is intact to light touch. Bilateral finger-to-nose is normal with no signs of dysmetria.   Skin: Skin is warm and dry. No rash noted. No erythema.  Psychiatric: She has a normal mood and affect. Her behavior is normal.    ED Course  Procedures (including critical care time) Labs Review Labs Reviewed  URINALYSIS, ROUTINE W REFLEX MICROSCOPIC - Abnormal; Notable for the following:    Hgb urine dipstick TRACE (*)    Leukocytes, UA TRACE (*)    All other components within normal limits  URINE MICROSCOPIC-ADD ON - Abnormal; Notable for the following:    Squamous Epithelial / LPF FEW (*)    Bacteria, UA FEW (*)    All other components within normal limits  CBC WITH DIFFERENTIAL - Abnormal; Notable for the following:    WBC 11.6 (*)    RBC 3.70 (*)    Hemoglobin 11.9 (*)    HCT 34.1 (*)    Neutro Abs 8.2 (*)    All other components within normal limits  COMPREHENSIVE METABOLIC PANEL  PRO B NATRIURETIC PEPTIDE  TROPONIN I  CBG MONITORING, ED    Imaging Review Dg Chest 2 View  01/07/2014   CLINICAL DATA:  Cough.  Initial encounter.  EXAM: CHEST  2 VIEW  COMPARISON:  None.  FINDINGS: Normal heart size and mediastinal contours. No acute infiltrate or edema. No effusion or pneumothorax.  Bilateral distal clavicle resection.  No acute osseous  findings.  Cholecystectomy changes.  IMPRESSION: No active cardiopulmonary disease.   Electronically Signed   By: Jorje Guild M.D.   On: 01/07/2014 03:02     EKG Interpretation None      Date: 01/07/2014  Rate: 81   Rhythm: normal sinus rhythm  QRS Axis: normal  Intervals: normal  ST/T Wave abnormalities: normal  Conduction Disutrbances:none  Narrative Interpretation:   Old EKG Reviewed: none available   MDM   Final diagnoses:  None    Pt had short runs of wide-complex tachycardia recorded on the monitor. Appears consistent with ventricular tachycardia and possibly torsades. Cardiology consulted who reviewed the patient's rhythm strip. Patient with normal narrow complex QRS's that March through the rhythm. Consistent with artifact. Recommend no further cardiac investigation  Patient is ambulating without difficulty. We'll discharge home to follow up with primary Dr. Treat for likely allergic rhinitis/sinusitis. Return precautions given.  Julianne Rice, MD 01/08/14 [phone]

## 2014-03-28 ENCOUNTER — Other Ambulatory Visit: Payer: Self-pay

## 2014-03-28 DIAGNOSIS — Z1231 Encounter for screening mammogram for malignant neoplasm of breast: Secondary | ICD-10-CM

## 2014-05-13 ENCOUNTER — Ambulatory Visit: Payer: Medicare Other

## 2014-05-22 ENCOUNTER — Ambulatory Visit
Admission: RE | Admit: 2014-05-22 | Discharge: 2014-05-22 | Disposition: A | Discharge status: Home / Self Care | Payer: Medicare Other | Source: Ambulatory Visit

## 2014-05-22 DIAGNOSIS — Z1231 Encounter for screening mammogram for malignant neoplasm of breast: Secondary | ICD-10-CM

## 2014-08-11 NOTE — Op Note (Signed)
PATIENT NAME:  Valerie Cooper, Valerie Cooper MR#:  [id number] DATE OF BIRTH:  May 16, 1933  DATE OF PROCEDURE:  05/08/2011  PREOPERATIVE DIAGNOSIS:  Suspicion of fish bone in the pharynx.   POSTOPERATIVE DIAGNOSIS: Sensation of foreign body in the pharynx.   OPERATIVE PROCEDURE: Flexible laryngoscopy.   SURGEON: Huey Romans, M.D.   ANESTHESIA: Topical.   DESCRIPTION OF PROCEDURE: The patient was given topical anesthesia by spraying the nose with 4% Xylocaine mixed with Afrin. The flexible scope was passed through the right nostril as the airway is wide open and clear all the way to the back. Nasopharynx is clear. Hypopharynx shows normal epiglottis. The vocal cords are pearly white. They move well. I do not see any signs of lesions there. No sign of foreign body in the laryngeal inlet at all. The arytenoids are normal size. They are not red. There is no foreign body seen in the post arytenoid area. Her lateral pharyngeal walls collapse inward and I cannot see the piriform sinuses well. The vallecula looks clear. I have her pull her tongue forward, so I could see better. There is no sign of any foreign body in the vallecula. Lateral walls do not show any signs of foreign body. The patient tolerated the procedure well. This was done at the bedside. There were no operative complications.  ____________________________ Huey Romans, MD phj:ap D: 05/08/2011 23:22:46 ET T: 05/09/2011 10:03:20 ET JOB#: [id number]  cc: Huey Romans, MD, <Dictator> Huey Romans MD ELECTRONICALLY SIGNED 05/10/2011 18:44

## 2014-08-11 NOTE — Consult Note (Signed)
PATIENT NAME:  Valerie Cooper, Valerie Cooper MR#:  [id number] DATE OF BIRTH:  06-29-1933  DATE OF CONSULTATION:  05/08/2011  REFERRING PHYSICIAN:   CONSULTING PHYSICIAN:  Huey Romans, MD  REASON FOR CONSULTATION: Foreign body of pharynx.   HISTORY OF PRESENT ILLNESS: The patient is a 81 year old African American female who was eating fish for dinner and felt like a fishbone got stuck in her throat. Every time she swallows, she gets a stabbing pain, feels like it is jabbing into her. She is not short of breath at all. She has not spit up any blood. Her voice is not changed. She just has this pain with swallowing.   PAST MEDICAL HISTORY: Significant for being pretty healthy overall. She does have mild hypertension. No meds for that.   ALLERGIES: None.   SOCIAL HISTORY: She does not smoke.   PHYSICAL EXAMINATION:  GENERAL: The patient is alert and awake. She is vocalizing well although she does not want to talk a lot because of pain. Her voice is clear.   HEENT: Her oropharynx shows no oral lesions. Her nose looks open.   NECK: Negative for any nodes or masses.   Flexible laryngoscopy is done and this is dictated in detail elsewhere. This shows a healthy nose, nasopharynx and hypopharynx. There are no sign of any foreign bodies in the hypopharynx or larynx that I can see.   IMPRESSION AND RECOMMENDATIONS: She has a foreign body sensation, but I do not see obvious evidence of this. We are going to allow her to eat and drink. Use Tylenol for pain and if the sensation does not settle down in 48 to 72 hours, then she can return and we will consider a direct laryngoscopy under anesthesia for evaluating this closer and see if it could be hidden anywhere else that cannot be seen. If her symptoms start to subside as I suspect they will, then it is likely from a tear that occurred while the fishbone went down.    ____________________________ Huey Romans, MD phj:ap D: 05/08/2011 23:27:54 ET T: 05/09/2011  10:06:58 ET JOB#: [id number]  cc: Huey Romans, MD, <Dictator> Huey Romans MD ELECTRONICALLY SIGNED 05/10/2011 18:45

## 2014-09-10 ENCOUNTER — Emergency Department (HOSPITAL_COMMUNITY)
Admission: EM | Admit: 2014-09-10 | Discharge: 2014-09-10 | Disposition: A | Discharge status: Home / Self Care | Payer: Medicare Other | Attending: Emergency Medicine | Admitting: Emergency Medicine

## 2014-09-10 ENCOUNTER — Emergency Department (HOSPITAL_COMMUNITY): Payer: Medicare Other

## 2014-09-10 ENCOUNTER — Encounter (HOSPITAL_COMMUNITY): Payer: Self-pay | Admitting: Emergency Medicine

## 2014-09-10 DIAGNOSIS — W010XXA Fall on same level from slipping, tripping and stumbling without subsequent striking against object, initial encounter: Secondary | ICD-10-CM | POA: Diagnosis not present

## 2014-09-10 DIAGNOSIS — Z8619 Personal history of other infectious and parasitic diseases: Secondary | ICD-10-CM | POA: Diagnosis not present

## 2014-09-10 DIAGNOSIS — Z7951 Long term (current) use of inhaled steroids: Secondary | ICD-10-CM | POA: Diagnosis not present

## 2014-09-10 DIAGNOSIS — M25571 Pain in right ankle and joints of right foot: Secondary | ICD-10-CM | POA: Insufficient documentation

## 2014-09-10 DIAGNOSIS — S4992XA Unspecified injury of left shoulder and upper arm, initial encounter: Secondary | ICD-10-CM | POA: Diagnosis present

## 2014-09-10 DIAGNOSIS — H269 Unspecified cataract: Secondary | ICD-10-CM | POA: Insufficient documentation

## 2014-09-10 DIAGNOSIS — E785 Hyperlipidemia, unspecified: Secondary | ICD-10-CM | POA: Diagnosis not present

## 2014-09-10 DIAGNOSIS — Y998 Other external cause status: Secondary | ICD-10-CM | POA: Insufficient documentation

## 2014-09-10 DIAGNOSIS — I10 Essential (primary) hypertension: Secondary | ICD-10-CM | POA: Diagnosis not present

## 2014-09-10 DIAGNOSIS — Z79899 Other long term (current) drug therapy: Secondary | ICD-10-CM | POA: Insufficient documentation

## 2014-09-10 DIAGNOSIS — Y9389 Activity, other specified: Secondary | ICD-10-CM | POA: Insufficient documentation

## 2014-09-10 DIAGNOSIS — Z8711 Personal history of peptic ulcer disease: Secondary | ICD-10-CM | POA: Diagnosis not present

## 2014-09-10 DIAGNOSIS — Z8739 Personal history of other diseases of the musculoskeletal system and connective tissue: Secondary | ICD-10-CM | POA: Insufficient documentation

## 2014-09-10 DIAGNOSIS — S59902A Unspecified injury of left elbow, initial encounter: Secondary | ICD-10-CM | POA: Insufficient documentation

## 2014-09-10 DIAGNOSIS — S42302A Unspecified fracture of shaft of humerus, left arm, initial encounter for closed fracture: Secondary | ICD-10-CM

## 2014-09-10 DIAGNOSIS — R197 Diarrhea, unspecified: Secondary | ICD-10-CM | POA: Insufficient documentation

## 2014-09-10 DIAGNOSIS — Y92009 Unspecified place in unspecified non-institutional (private) residence as the place of occurrence of the external cause: Secondary | ICD-10-CM | POA: Insufficient documentation

## 2014-09-10 DIAGNOSIS — S42292A Other displaced fracture of upper end of left humerus, initial encounter for closed fracture: Secondary | ICD-10-CM | POA: Insufficient documentation

## 2014-09-10 DIAGNOSIS — M79673 Pain in unspecified foot: Secondary | ICD-10-CM

## 2014-09-10 MED ORDER — TRAMADOL HCL 50 MG PO TABS: 50.0000 mg | ORAL_TABLET | Freq: Two times a day (BID) | ORAL | Status: DC | PRN

## 2014-09-10 MED ORDER — TRAMADOL HCL 50 MG PO TABS
50.0000 mg | ORAL_TABLET | Freq: Once | ORAL | Status: AC
  Administered 2014-09-10: 50 mg via ORAL
  Filled 2014-09-10: qty 1

## 2014-09-10 NOTE — ED Notes (Signed)
Pt EMS pt comes from home c/o left arm/shoulder pain after falling.  Pt has bone spurs and pain in right foot, so when she stood up on the foot had sharp pains so pt shifted her weight to her left side causing her to fall on the arm.  EMS has arm splinted and was given 150 mcg Fentanyl in route.

## 2014-09-10 NOTE — Discharge Instructions (Signed)
Humerus Fracture, Treated with Immobilization The humerus is the large bone in the upper arm. A broken (fractured) humerus is often treated by wearing a cast, splint, or sling (immobilization). This holds the broken pieces in place so they can heal.  HOME CARE  Put ice on the injured area.  Put ice in a plastic bag.  Place a towel between your skin and the bag.  Leave the ice on for 15-20 minutes, 03-04 times a day.  If you are given a cast:  Do not scratch the skin under the cast.  Check the skin around the cast every day. You may put lotion on any red or sore areas.  Keep the cast dry and clean.  If you are given a splint:  Wear the splint as told.  Keep the splint clean and dry.  Loosen the elastic around the splint if your fingers become numb, cold, tingle, or turn blue.  If you are given a sling:  Wear the sling as told.  Do not put pressure on any part of the cast or splint until it is fully hardened.  The cast or splint must be protected with a plastic bag during bathing. Do not lower the cast or splint into water.  Only take medicine as told by your doctor.  Do exercises as told by your doctor.  Follow up as told by your doctor. GET HELP RIGHT AWAY IF:   Your skin or fingernails turn blue or gray.  Your arm feels cold or numb.  You have very bad pain in the injured arm.  You are having problems with the medicines you were given. MAKE SURE YOU:   Understand these instructions.  Will watch your condition.  Will get help right away if you are not doing well or get worse. Document Released: 09/22/2007 Document Revised: 06/28/2011 Document Reviewed: 05/20/2010 [facility] Patient Information 2015 [place], [place]. This information is not intended to replace advice given to you by your health care provider. Make sure you discuss any questions you have with your health care provider.

## 2014-09-10 NOTE — ED Provider Notes (Signed)
CSN: [id number]     Arrival date & time 09/10/14  1504 History   First MD Initiated Contact with Patient 09/10/14 [phone]     Chief Complaint  Patient presents with  . Fall  . Arm Injury     (Consider location/radiation/quality/duration/timing/severity/associated sxs/prior Treatment) HPI Comments: Patient states she slipped on the hardwood floor in her living room while attempting to get up to answer the phone.  Mechanical fall, did not get dizzy or lightheaded.  Patient also reporting right heel pain--she was scheduled to see her PCP today for same, but ended up in the ED after her fall.  Patient is a 81 y.o. female presenting with fall and arm injury. The history is provided by the patient. No language interpreter was used.  Fall This is a new problem. The current episode started today. Associated symptoms include arthralgias. Pertinent negatives include no neck pain or vertigo. Associated symptoms comments: Left shoulder, upper arm, and elbow pain. She has tried immobilization for the symptoms. The treatment provided mild relief.  Arm Injury Associated symptoms: no neck pain     Past Medical History  Diagnosis Date  . Ulcer 1980s    PUD ?due to ASA intake  . Hyperlipidemia   . Hypertension   . H/O colonoscopy 2005  . Shingles 2001    right leg  . Cataract   . DJD (degenerative joint disease)   . Osteoporosis   . Complication of anesthesia     It takes awhile for me to wake up "  . Family history of anesthesia complication    Past Surgical History  Procedure Laterality Date  . Abdominal hysterectomy  1975  . Rotator cuff repair  2005    4 surgeries  . Cholecystectomy  05/29/2012    Dr Lucia Gaskins  . Cholecystectomy N/A 05/29/2012    Procedure: LAPAROSCOPIC CHOLECYSTECTOMY WITH INTRAOPERATIVE CHOLANGIOGRAM;  Surgeon: Shann Medal, MD;  Location: [facility] OR;  Service: General;  Laterality: N/A;   Family History  Problem Relation Age of Onset  . Stroke Mother   . Stroke Father     History  Substance Use Topics  . Smoking status: Never Smoker   . Smokeless tobacco: Never Used  . Alcohol Use: No   OB History    No data available     Review of Systems  Gastrointestinal: Positive for diarrhea.  Musculoskeletal: Positive for arthralgias. Negative for neck pain.  Neurological: Negative for vertigo.  All other systems reviewed and are negative.     Allergies  Review of patient's allergies indicates no known allergies.  Home Medications   Prior to Admission medications   Medication Sig Start Date End Date Taking? Authorizing Provider  acetaminophen (TYLENOL) 500 MG tablet Take 500 mg by mouth every 6 (six) hours as needed for headache.     Historical Provider, MD  atorvastatin (LIPITOR) 20 MG tablet Take 20 mg by mouth daily.    Historical Provider, MD  loratadine (CLARITIN) 10 MG tablet Take 1 tablet (10 mg total) by mouth daily. One po daily x 5 days 01/07/14   Julianne Rice, MD  losartan-hydrochlorothiazide (HYZAAR) 100-25 MG per tablet Take 1 tablet by mouth daily.    Historical Provider, MD  mometasone (NASONEX) 50 MCG/ACT nasal spray Place 2 sprays into the nose daily. 01/07/14   Julianne Rice, MD  oxymetazoline (AFRIN NASAL SPRAY) 0.05 % nasal spray Place 1 spray into both nostrils 2 (two) times daily. 01/07/14   Julianne Rice, MD   BP 119/31  mmHg  Pulse 76  Temp(Src) 97.8 F (36.6 C) (Oral)  Resp 14  SpO2 92% Physical Exam  Constitutional: She is oriented to person, place, and time. She appears well-developed and well-nourished.  HENT:  Head: Normocephalic.  Eyes: Conjunctivae are normal.  Neck: Neck supple.  Cardiovascular: Normal rate and regular rhythm.   Pulmonary/Chest: Effort normal and breath sounds normal.  Abdominal: Soft. Bowel sounds are normal. There is no tenderness.  Musculoskeletal: She exhibits tenderness.       Left shoulder: She exhibits tenderness.       Left elbow: Tenderness found.       Arms:       Feet:  Neurological: She is alert and oriented to person, place, and time.  Skin: Skin is warm and dry.  Psychiatric: She has a normal mood and affect.  Nursing note and vitals reviewed.   ED Course  Procedures (including critical care time) Labs Review Labs Reviewed - No data to display  Imaging Review No results found.   EKG Interpretation None     Radiology results reviewed and shared with patient. Patient discussed with and seen by Dr. Tawnya Crook. MDM   Final diagnoses:  Heel pain    Left proximal humerus fracture. Shoulder immobilizer. Follow-up with orthopedics. Arrangements for home health assistance provided by case management.    Etta Quill, NP 09/11/14 9935  Ernestina Patches, MD 09/11/14 1049

## 2014-09-10 NOTE — Progress Notes (Signed)
81 yr female with humerus fx. Pt's sister and nephew at bedside Dtr in [place] called and will be in [place] on Friday 09/13/14 to be with pt as primary caregiver to relieve pt's sister. CM reviewed in details medicare guidelines, home health [facility]) (length of stay in home, types of [facility] staff available, coverage, primary caregiver, up to 24 hrs before services may be started), Private duty nursing (PDN-coverage, length of stay in the home types of staff available),  CM reviewed availability of [facility] to assist pcp to get pt to snf (if desired disposition) from the community level.  CM provided pt with a list of [place]. PDN list refused because of out of network expense Pt choice of home health agency is [facility]  Pt preference is to go stay with her sister who works from home "for two days" and then return to her home.  Preference is for [facility] to call her on her cell phone [phone] prn. EPIC updated Pt inquired about the type of cast or sling she would get Cm referred to Ortho technician  [phone] Cm spoke with [facility] staff, Cyril Mourning, to provided home health referral for [facility], HHPT and quad cane Orders entered  [id number] ED NP/PA and EDP updated on d/c plans and orders entered/needed

## 2014-09-10 NOTE — ED Notes (Signed)
Bed: WA25 Expected date:  Expected time:  Means of arrival:  Comments: EMS  Fall 

## 2014-09-10 NOTE — Progress Notes (Signed)
CSW met with pt at bedside. Sister was present. Patient confirms that she presents to [facility] due to fall. Patient states that she does not fall often. She states that this is her first time falling. Also, patient confirms that she is from home.   Patient states she lives at home alone in [place].She informed CSW that prior to coming to [facility] she was able to complete her ADL's independently. However, she now states that she is not sure if she would be able to complete her ADL's without assistance.  Patient states that she is not interested in Facility, but would possibly like home health.  Patient and sister informed CSW that the pt still works. 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  Patient states that she has no further questions at this time.  Willette Brace [phone] ED CSW 09/10/2014 4:18 PM

## 2014-09-10 NOTE — ED Notes (Signed)
Ortho tech made aware of immobilizer.

## 2014-09-17 ENCOUNTER — Other Ambulatory Visit: Payer: Self-pay | Admitting: Sports Medicine

## 2015-02-13 ENCOUNTER — Encounter (HOSPITAL_COMMUNITY): Payer: Self-pay | Admitting: Emergency Medicine

## 2015-02-13 ENCOUNTER — Emergency Department (HOSPITAL_COMMUNITY)
Admission: EM | Admit: 2015-02-13 | Discharge: 2015-02-13 | Disposition: A | Discharge status: Home / Self Care | Payer: Medicare Other | Attending: Emergency Medicine | Admitting: Emergency Medicine

## 2015-02-13 DIAGNOSIS — E785 Hyperlipidemia, unspecified: Secondary | ICD-10-CM | POA: Insufficient documentation

## 2015-02-13 DIAGNOSIS — I10 Essential (primary) hypertension: Secondary | ICD-10-CM | POA: Diagnosis not present

## 2015-02-13 DIAGNOSIS — Z8711 Personal history of peptic ulcer disease: Secondary | ICD-10-CM | POA: Diagnosis not present

## 2015-02-13 DIAGNOSIS — H81399 Other peripheral vertigo, unspecified ear: Secondary | ICD-10-CM | POA: Diagnosis not present

## 2015-02-13 DIAGNOSIS — R5383 Other fatigue: Secondary | ICD-10-CM | POA: Diagnosis present

## 2015-02-13 DIAGNOSIS — Z79899 Other long term (current) drug therapy: Secondary | ICD-10-CM | POA: Diagnosis not present

## 2015-02-13 LAB — CBC WITH DIFFERENTIAL/PLATELET
Basophils Absolute: 0 10*3/uL (ref 0.0–0.1)
Basophils Relative: 0 %
Eosinophils Absolute: 0 10*3/uL (ref 0.0–0.7)
Eosinophils Relative: 0 %
HEMATOCRIT: 38.5 % (ref 36.0–46.0)
Hemoglobin: 12.7 g/dL (ref 12.0–15.0)
LYMPHS ABS: 1.7 10*3/uL (ref 0.7–4.0)
LYMPHS PCT: 13 %
MCH: 31.4 pg (ref 26.0–34.0)
MCHC: 33 g/dL (ref 30.0–36.0)
MCV: 95.1 fL (ref 78.0–100.0)
MONO ABS: 0.8 10*3/uL (ref 0.1–1.0)
Monocytes Relative: 6 %
Neutro Abs: 10.8 10*3/uL — ABNORMAL HIGH (ref 1.7–7.7)
Neutrophils Relative %: 81 %
PLATELETS: 327 10*3/uL (ref 150–400)
RBC: 4.05 MIL/uL (ref 3.87–5.11)
RDW: 13.1 % (ref 11.5–15.5)
WBC: 13.3 10*3/uL — AB (ref 4.0–10.5)

## 2015-02-13 LAB — URINE MICROSCOPIC-ADD ON

## 2015-02-13 LAB — URINALYSIS, ROUTINE W REFLEX MICROSCOPIC
Bilirubin Urine: NEGATIVE
Glucose, UA: NEGATIVE mg/dL
Ketones, ur: NEGATIVE mg/dL
Nitrite: NEGATIVE
PH: 5 (ref 5.0–8.0)
Protein, ur: NEGATIVE mg/dL
SPECIFIC GRAVITY, URINE: 1.019 (ref 1.005–1.030)
Urobilinogen, UA: 0.2 mg/dL (ref 0.0–1.0)

## 2015-02-13 LAB — COMPREHENSIVE METABOLIC PANEL
ALT: 20 U/L (ref 14–54)
AST: 23 U/L (ref 15–41)
Albumin: 4.3 g/dL (ref 3.5–5.0)
Alkaline Phosphatase: 43 U/L (ref 38–126)
Anion gap: 9 (ref 5–15)
BUN: 20 mg/dL (ref 6–20)
CHLORIDE: 102 mmol/L (ref 101–111)
CO2: 27 mmol/L (ref 22–32)
CREATININE: 1.08 mg/dL — AB (ref 0.44–1.00)
Calcium: 9.7 mg/dL (ref 8.9–10.3)
GFR calc Af Amer: 55 mL/min — ABNORMAL LOW (ref >60)
GFR calc non Af Amer: 48 mL/min — ABNORMAL LOW (ref >60)
Glucose, Bld: 109 mg/dL — ABNORMAL HIGH (ref 65–99)
Potassium: 3.6 mmol/L (ref 3.5–5.1)
SODIUM: 138 mmol/L (ref 135–145)
Total Bilirubin: 0.4 mg/dL (ref 0.3–1.2)
Total Protein: 7.4 g/dL (ref 6.5–8.1)

## 2015-02-13 LAB — TROPONIN I: Troponin I: <0.03 ng/mL (ref <0.031)

## 2015-02-13 MED ORDER — MECLIZINE HCL 25 MG PO TABS: 25.0000 mg | ORAL_TABLET | Freq: Three times a day (TID) | ORAL | Status: DC | PRN

## 2015-02-13 MED ORDER — MECLIZINE HCL 25 MG PO TABS
25.0000 mg | ORAL_TABLET | Freq: Once | ORAL | Status: AC
  Administered 2015-02-13: 25 mg via ORAL
  Filled 2015-02-13: qty 1

## 2015-02-13 NOTE — ED Notes (Signed)
EDP at bedside  

## 2015-02-13 NOTE — ED Provider Notes (Signed)
CSN: [id number]     Arrival date & time 02/13/15  0001 History  By signing my name below, I, Hansel Feinstein, attest that this documentation has been prepared under the direction and in the presence of Delora Fuel, MD. Electronically Signed: Hansel Feinstein, ED Scribe. 02/13/2015. 1:08 AM.    Chief Complaint  Patient presents with  . Hypertension  . Fatigue   The history is provided by the patient. No language interpreter was used.    HPI Comments: CARETHA RUMBAUGH is a 81 y.o. female with h/o HLD, HTN, DJD who presents to the Emergency Department complaining of mild, intermittent sensation of "bloating" in her head, onset 10 days ago. Pt states that she was seen by Dr. Laurann Montana at [facility] 10 days ago and had blood work done, finding slightly low potassium. She states she started an prescription medication for the low potassium after the visit and discontinued it after consulting with her PCP, Dr. Delfina Redwood. Pt states that she saw Dr. Delfina Redwood 2 days ago and received a possible diagnosis of a UTI but has not received lab results. She states she feels slightly dizzy (drunk-feeling) and has the same sensation of bloating in her head tonight, prompting her to come in to the ED. She also notes urgency of urination and dysuria. She takes Hyzaar daily. She denies visual disturbance, nausea, vomiting, fever, chills, diaphoresis, hematuria.   Past Medical History  Diagnosis Date  . Ulcer 1980s    PUD ?due to ASA intake  . Hyperlipidemia   . Hypertension   . H/O colonoscopy 2005  . Shingles 2001    right leg  . Cataract   . DJD (degenerative joint disease)   . Osteoporosis   . Complication of anesthesia     It takes awhile for me to wake up "  . Family history of anesthesia complication    Past Surgical History  Procedure Laterality Date  . Abdominal hysterectomy  1975  . Rotator cuff repair  2005    4 surgeries  . Cholecystectomy  05/29/2012    Dr Lucia Gaskins  . Cholecystectomy N/A 05/29/2012    Procedure:  LAPAROSCOPIC CHOLECYSTECTOMY WITH INTRAOPERATIVE CHOLANGIOGRAM;  Surgeon: Shann Medal, MD;  Location: [facility] OR;  Service: General;  Laterality: N/A;   Family History  Problem Relation Age of Onset  . Stroke Mother   . Stroke Father    Social History  Substance Use Topics  . Smoking status: Never Smoker   . Smokeless tobacco: Never Used  . Alcohol Use: No   OB History    No data available     Review of Systems  Constitutional: Negative for fever, chills and diaphoresis.  Eyes: Negative for visual disturbance.  Gastrointestinal: Negative for nausea and vomiting.  Genitourinary: Positive for dysuria and urgency. Negative for hematuria.  Neurological: Positive for dizziness.       +bloating sensation in head   All other systems reviewed and are negative.  Allergies  Other  Home Medications   Prior to Admission medications   Medication Sig Start Date End Date Taking? Authorizing Provider  atorvastatin (LIPITOR) 20 MG tablet Take 20 mg by mouth daily.    Historical Provider, MD  loratadine (CLARITIN) 10 MG tablet Take 1 tablet (10 mg total) by mouth daily. One po daily x 5 days Patient not taking: Reported on 09/10/2014 01/07/14   Julianne Rice, MD  losartan-hydrochlorothiazide (HYZAAR) 100-25 MG per tablet Take 1 tablet by mouth daily.    Historical Provider, MD  mometasone (NASONEX) 50 MCG/ACT nasal spray Place 2 sprays into the nose daily. Patient not taking: Reported on 09/10/2014 01/07/14   Julianne Rice, MD  oxymetazoline [facility] NASAL SPRAY) 0.05 % nasal spray Place 1 spray into both nostrils 2 (two) times daily. Patient not taking: Reported on 09/10/2014 01/07/14   Julianne Rice, MD   BP 159/75 mmHg  Pulse 89  Temp(Src) 98 F (36.7 C) (Oral)  Resp 16  SpO2 99% Physical Exam  Constitutional: She is oriented to person, place, and time. She appears well-developed and well-nourished.  HENT:  Head: Normocephalic and atraumatic.  Eyes: Conjunctivae and EOM are normal.  Pupils are equal, round, and reactive to light.  Neck: Normal range of motion. Neck supple. No JVD present.  No carotid bruits.   Cardiovascular: Normal rate, regular rhythm and normal heart sounds.   No murmur heard. Pulmonary/Chest: Effort normal and breath sounds normal. She has no wheezes. She has no rales. She exhibits no tenderness.  Abdominal: Soft. Bowel sounds are normal. She exhibits no distension and no mass. There is no tenderness.  Musculoskeletal: Normal range of motion. She exhibits no edema.  Lymphadenopathy:    She has no cervical adenopathy.  Neurological: She is alert and oriented to person, place, and time. No cranial nerve deficit. She exhibits normal muscle tone. Coordination normal.  Dizziness is reproduced by passive head movement.   Skin: Skin is warm and dry. No rash noted.  Psychiatric: She has a normal mood and affect. Her behavior is normal. Judgment and thought content normal.  Nursing note and vitals reviewed.  ED Course  Procedures (including critical care time) DIAGNOSTIC STUDIES: Oxygen Saturation is 99% on RA, normal by my interpretation.    COORDINATION OF CARE: 1:07 AM Discussed treatment plan with pt at bedside and pt agreed to plan.   Labs Review Results for orders placed or performed during the hospital encounter of 02/13/15  Comprehensive metabolic panel  Result Value Ref Range   Sodium 138 135 - 145 mmol/L   Potassium 3.6 3.5 - 5.1 mmol/L   Chloride 102 101 - 111 mmol/L   CO2 27 22 - 32 mmol/L   Glucose, Bld 109 (H) 65 - 99 mg/dL   BUN 20 6 - 20 mg/dL   Creatinine, Ser 1.08 (H) 0.44 - 1.00 mg/dL   Calcium 9.7 8.9 - 10.3 mg/dL   Total Protein 7.4 6.5 - 8.1 g/dL   Albumin 4.3 3.5 - 5.0 g/dL   AST 23 15 - 41 U/L   ALT 20 14 - 54 U/L   Alkaline Phosphatase 43 38 - 126 U/L   Total Bilirubin 0.4 0.3 - 1.2 mg/dL   GFR calc non Af Amer 48 (L) >60 mL/min   GFR calc Af Amer 55 (L) >60 mL/min   Anion gap 9 5 - 15  Troponin I  Result Value  Ref Range   Troponin I <0.03 <0.031 ng/mL  CBC with Differential  Result Value Ref Range   WBC 13.3 (H) 4.0 - 10.5 K/uL   RBC 4.05 3.87 - 5.11 MIL/uL   Hemoglobin 12.7 12.0 - 15.0 g/dL   HCT 38.5 36.0 - 46.0 %   MCV 95.1 78.0 - 100.0 fL   MCH 31.4 26.0 - 34.0 pg   MCHC 33.0 30.0 - 36.0 g/dL   RDW 13.1 11.5 - 15.5 %   Platelets 327 150 - 400 K/uL   Neutrophils Relative % 81 %   Neutro Abs 10.8 (H) 1.7 - 7.7 K/uL  Lymphocytes Relative 13 %   Lymphs Abs 1.7 0.7 - 4.0 K/uL   Monocytes Relative 6 %   Monocytes Absolute 0.8 0.1 - 1.0 K/uL   Eosinophils Relative 0 %   Eosinophils Absolute 0.0 0.0 - 0.7 K/uL   Basophils Relative 0 %   Basophils Absolute 0.0 0.0 - 0.1 K/uL  Urinalysis, Routine w reflex microscopic  Result Value Ref Range   Color, Urine YELLOW YELLOW   APPearance CLEAR CLEAR   Specific Gravity, Urine 1.019 1.005 - 1.030   pH 5.0 5.0 - 8.0   Glucose, UA NEGATIVE NEGATIVE mg/dL   Hgb urine dipstick SMALL (A) NEGATIVE   Bilirubin Urine NEGATIVE NEGATIVE   Ketones, ur NEGATIVE NEGATIVE mg/dL   Protein, ur NEGATIVE NEGATIVE mg/dL   Urobilinogen, UA 0.2 0.0 - 1.0 mg/dL   Nitrite NEGATIVE NEGATIVE   Leukocytes, UA SMALL (A) NEGATIVE  Urine microscopic-add on  Result Value Ref Range   Squamous Epithelial / LPF RARE RARE   WBC, UA 3-6 <3 WBC/hpf   RBC / HPF 3-6 <3 RBC/hpf   Bacteria, UA RARE RARE   I have personally reviewed and evaluated these lab results as part of my medical decision-making.   MDM   Final diagnoses:  Peripheral vertigo, unspecified laterality    Dizziness which is suspicious for peripheral vertigo. Report of recent hypokalemia. I'm unable to access the lab tests which were done recently. Laboratory testing will be repeated and she'll be given dose of meclizine.  Laboratory workup is unremarkable including potassium of 3.6. No evidence of UTI. Creatinine is at baseline. She feels much better after a dose of meclizine. She is advised to continue  taking her potassium since her serum potassium is borderline. Follow-up with PCP.   I personally performed the services described in this documentation, which was scribed in my presence. The recorded information has been reviewed and is accurate.       Delora Fuel, MD [id number]

## 2015-02-13 NOTE — ED Notes (Addendum)
Pt. reports elevated blood pressure this week with generalized weakness/ fatigue " I don't feel good " and feeling tired , denies fever .

## 2015-02-13 NOTE — Discharge Instructions (Signed)

## 2015-04-22 ENCOUNTER — Other Ambulatory Visit: Payer: Self-pay

## 2015-04-22 DIAGNOSIS — Z1231 Encounter for screening mammogram for malignant neoplasm of breast: Secondary | ICD-10-CM

## 2015-05-27 ENCOUNTER — Ambulatory Visit
Admission: RE | Admit: 2015-05-27 | Discharge: 2015-05-27 | Disposition: A | Discharge status: Home / Self Care | Payer: Medicare Other | Source: Ambulatory Visit

## 2015-05-27 DIAGNOSIS — Z1231 Encounter for screening mammogram for malignant neoplasm of breast: Secondary | ICD-10-CM

## 2015-06-09 DIAGNOSIS — M545 Low back pain: Secondary | ICD-10-CM | POA: Diagnosis not present

## 2015-06-27 ENCOUNTER — Ambulatory Visit
Admission: RE | Admit: 2015-06-27 | Discharge: 2015-06-27 | Disposition: A | Discharge status: Home / Self Care | Payer: Medicare Other | Source: Ambulatory Visit | Attending: Internal Medicine | Admitting: Internal Medicine

## 2015-06-27 ENCOUNTER — Other Ambulatory Visit: Payer: Self-pay | Admitting: Internal Medicine

## 2015-06-27 DIAGNOSIS — K219 Gastro-esophageal reflux disease without esophagitis: Secondary | ICD-10-CM | POA: Diagnosis not present

## 2015-06-27 DIAGNOSIS — R1031 Right lower quadrant pain: Secondary | ICD-10-CM | POA: Diagnosis not present

## 2015-06-27 DIAGNOSIS — R1084 Generalized abdominal pain: Secondary | ICD-10-CM

## 2015-06-27 DIAGNOSIS — R109 Unspecified abdominal pain: Secondary | ICD-10-CM | POA: Diagnosis not present

## 2015-07-07 DIAGNOSIS — R351 Nocturia: Secondary | ICD-10-CM | POA: Diagnosis not present

## 2015-07-28 DIAGNOSIS — M7502 Adhesive capsulitis of left shoulder: Secondary | ICD-10-CM | POA: Diagnosis not present

## 2015-07-28 DIAGNOSIS — G8918 Other acute postprocedural pain: Secondary | ICD-10-CM | POA: Diagnosis not present

## 2015-07-28 DIAGNOSIS — M19012 Primary osteoarthritis, left shoulder: Secondary | ICD-10-CM | POA: Diagnosis not present

## 2015-07-29 DIAGNOSIS — M25612 Stiffness of left shoulder, not elsewhere classified: Secondary | ICD-10-CM | POA: Diagnosis not present

## 2015-07-29 DIAGNOSIS — M25512 Pain in left shoulder: Secondary | ICD-10-CM | POA: Diagnosis not present

## 2015-07-29 DIAGNOSIS — S42202D Unspecified fracture of upper end of left humerus, subsequent encounter for fracture with routine healing: Secondary | ICD-10-CM | POA: Diagnosis not present

## 2015-07-29 DIAGNOSIS — R531 Weakness: Secondary | ICD-10-CM | POA: Diagnosis not present

## 2015-07-30 DIAGNOSIS — M25512 Pain in left shoulder: Secondary | ICD-10-CM | POA: Diagnosis not present

## 2015-07-30 DIAGNOSIS — S42202D Unspecified fracture of upper end of left humerus, subsequent encounter for fracture with routine healing: Secondary | ICD-10-CM | POA: Diagnosis not present

## 2015-07-30 DIAGNOSIS — M25612 Stiffness of left shoulder, not elsewhere classified: Secondary | ICD-10-CM | POA: Diagnosis not present

## 2015-07-30 DIAGNOSIS — R531 Weakness: Secondary | ICD-10-CM | POA: Diagnosis not present

## 2015-09-09 DIAGNOSIS — M19012 Primary osteoarthritis, left shoulder: Secondary | ICD-10-CM | POA: Diagnosis not present

## 2015-10-29 ENCOUNTER — Other Ambulatory Visit: Payer: Self-pay | Admitting: Internal Medicine

## 2015-10-29 DIAGNOSIS — Z7189 Other specified counseling: Secondary | ICD-10-CM | POA: Diagnosis not present

## 2015-10-29 DIAGNOSIS — E7800 Pure hypercholesterolemia, unspecified: Secondary | ICD-10-CM | POA: Diagnosis not present

## 2015-10-29 DIAGNOSIS — K219 Gastro-esophageal reflux disease without esophagitis: Secondary | ICD-10-CM | POA: Diagnosis not present

## 2015-10-29 DIAGNOSIS — R109 Unspecified abdominal pain: Secondary | ICD-10-CM

## 2015-10-29 DIAGNOSIS — I10 Essential (primary) hypertension: Secondary | ICD-10-CM | POA: Diagnosis not present

## 2015-11-03 ENCOUNTER — Other Ambulatory Visit: Payer: Medicare Other

## 2015-11-05 ENCOUNTER — Ambulatory Visit
Admission: RE | Admit: 2015-11-05 | Discharge: 2015-11-05 | Disposition: A | Discharge status: Home / Self Care | Payer: Medicare Other | Source: Ambulatory Visit | Attending: Internal Medicine | Admitting: Internal Medicine

## 2015-11-05 DIAGNOSIS — K5730 Diverticulosis of large intestine without perforation or abscess without bleeding: Secondary | ICD-10-CM | POA: Diagnosis not present

## 2015-11-05 DIAGNOSIS — R109 Unspecified abdominal pain: Secondary | ICD-10-CM

## 2015-11-05 MED ORDER — IOPAMIDOL (ISOVUE-300) INJECTION 61%
100.0000 mL | Freq: Once | INTRAVENOUS | Status: AC | PRN
  Administered 2015-11-05: 100 mL via INTRAVENOUS

## 2015-11-11 DIAGNOSIS — M19012 Primary osteoarthritis, left shoulder: Secondary | ICD-10-CM | POA: Diagnosis not present

## 2016-02-08 ENCOUNTER — Observation Stay (HOSPITAL_COMMUNITY)
Admission: EM | Admit: 2016-02-08 | Discharge: 2016-02-10 | Disposition: A | Discharge status: Home / Self Care | Payer: Medicare Other | Attending: Internal Medicine | Admitting: Internal Medicine

## 2016-02-08 ENCOUNTER — Emergency Department (HOSPITAL_COMMUNITY): Payer: Medicare Other

## 2016-02-08 ENCOUNTER — Encounter (HOSPITAL_COMMUNITY): Payer: Self-pay | Admitting: *Deleted

## 2016-02-08 DIAGNOSIS — R55 Syncope and collapse: Secondary | ICD-10-CM | POA: Diagnosis not present

## 2016-02-08 DIAGNOSIS — Y998 Other external cause status: Secondary | ICD-10-CM | POA: Diagnosis not present

## 2016-02-08 DIAGNOSIS — M50322 Other cervical disc degeneration at C5-C6 level: Secondary | ICD-10-CM | POA: Insufficient documentation

## 2016-02-08 DIAGNOSIS — E785 Hyperlipidemia, unspecified: Secondary | ICD-10-CM | POA: Diagnosis present

## 2016-02-08 DIAGNOSIS — Y9389 Activity, other specified: Secondary | ICD-10-CM | POA: Insufficient documentation

## 2016-02-08 DIAGNOSIS — S0219XA Other fracture of base of skull, initial encounter for closed fracture: Secondary | ICD-10-CM | POA: Diagnosis not present

## 2016-02-08 DIAGNOSIS — S199XXA Unspecified injury of neck, initial encounter: Secondary | ICD-10-CM | POA: Diagnosis not present

## 2016-02-08 DIAGNOSIS — M810 Age-related osteoporosis without current pathological fracture: Secondary | ICD-10-CM | POA: Diagnosis not present

## 2016-02-08 DIAGNOSIS — M1990 Unspecified osteoarthritis, unspecified site: Secondary | ICD-10-CM | POA: Insufficient documentation

## 2016-02-08 DIAGNOSIS — I10 Essential (primary) hypertension: Secondary | ICD-10-CM | POA: Diagnosis present

## 2016-02-08 DIAGNOSIS — S3993XA Unspecified injury of pelvis, initial encounter: Secondary | ICD-10-CM | POA: Diagnosis not present

## 2016-02-08 DIAGNOSIS — Z90710 Acquired absence of both cervix and uterus: Secondary | ICD-10-CM | POA: Diagnosis not present

## 2016-02-08 DIAGNOSIS — R220 Localized swelling, mass and lump, head: Secondary | ICD-10-CM

## 2016-02-08 DIAGNOSIS — Z884 Allergy status to anesthetic agent status: Secondary | ICD-10-CM | POA: Insufficient documentation

## 2016-02-08 DIAGNOSIS — K279 Peptic ulcer, site unspecified, unspecified as acute or chronic, without hemorrhage or perforation: Secondary | ICD-10-CM | POA: Diagnosis present

## 2016-02-08 DIAGNOSIS — Y92488 Other paved roadways as the place of occurrence of the external cause: Secondary | ICD-10-CM | POA: Diagnosis not present

## 2016-02-08 DIAGNOSIS — E876 Hypokalemia: Secondary | ICD-10-CM | POA: Diagnosis not present

## 2016-02-08 DIAGNOSIS — Z823 Family history of stroke: Secondary | ICD-10-CM | POA: Diagnosis not present

## 2016-02-08 DIAGNOSIS — M47812 Spondylosis without myelopathy or radiculopathy, cervical region: Secondary | ICD-10-CM | POA: Diagnosis not present

## 2016-02-08 DIAGNOSIS — Z8711 Personal history of peptic ulcer disease: Secondary | ICD-10-CM | POA: Diagnosis not present

## 2016-02-08 DIAGNOSIS — S299XXA Unspecified injury of thorax, initial encounter: Secondary | ICD-10-CM | POA: Diagnosis not present

## 2016-02-08 LAB — CBC
HCT: 38.1 % (ref 36.0–46.0)
HEMOGLOBIN: 12.8 g/dL (ref 12.0–15.0)
MCH: 31.4 pg (ref 26.0–34.0)
MCHC: 33.6 g/dL (ref 30.0–36.0)
MCV: 93.4 fL (ref 78.0–100.0)
PLATELETS: 302 10*3/uL (ref 150–400)
RBC: 4.08 MIL/uL (ref 3.87–5.11)
RDW: 13 % (ref 11.5–15.5)
WBC: 17.6 10*3/uL — ABNORMAL HIGH (ref 4.0–10.5)

## 2016-02-08 LAB — I-STAT CG4 LACTIC ACID, ED: LACTIC ACID, VENOUS: 1.49 mmol/L (ref 0.5–1.9)

## 2016-02-08 LAB — COMPREHENSIVE METABOLIC PANEL
ALBUMIN: 4.1 g/dL (ref 3.5–5.0)
ALK PHOS: 45 U/L (ref 38–126)
ALT: 25 U/L (ref 14–54)
ANION GAP: 10 (ref 5–15)
AST: 34 U/L (ref 15–41)
BILIRUBIN TOTAL: 0.5 mg/dL (ref 0.3–1.2)
BUN: 19 mg/dL (ref 6–20)
CALCIUM: 9.4 mg/dL (ref 8.9–10.3)
CO2: 27 mmol/L (ref 22–32)
Chloride: 102 mmol/L (ref 101–111)
Creatinine, Ser: 1.21 mg/dL — ABNORMAL HIGH (ref 0.44–1.00)
GFR calc Af Amer: 48 mL/min — ABNORMAL LOW (ref >60)
GFR calc non Af Amer: 41 mL/min — ABNORMAL LOW (ref >60)
GLUCOSE: 161 mg/dL — AB (ref 65–99)
Potassium: 2.9 mmol/L — ABNORMAL LOW (ref 3.5–5.1)
Sodium: 139 mmol/L (ref 135–145)
TOTAL PROTEIN: 7.7 g/dL (ref 6.5–8.1)

## 2016-02-08 LAB — MAGNESIUM: MAGNESIUM: 1.7 mg/dL (ref 1.7–2.4)

## 2016-02-08 LAB — I-STAT CHEM 8, ED
BUN: 22 mg/dL — AB (ref 6–20)
CALCIUM ION: 1.1 mmol/L — AB (ref 1.15–1.40)
CHLORIDE: 102 mmol/L (ref 101–111)
Creatinine, Ser: 1.2 mg/dL — ABNORMAL HIGH (ref 0.44–1.00)
Glucose, Bld: 159 mg/dL — ABNORMAL HIGH (ref 65–99)
HCT: 41 % (ref 36.0–46.0)
Hemoglobin: 13.9 g/dL (ref 12.0–15.0)
Potassium: 3 mmol/L — ABNORMAL LOW (ref 3.5–5.1)
SODIUM: 142 mmol/L (ref 135–145)
TCO2: 27 mmol/L (ref 0–100)

## 2016-02-08 LAB — PROTIME-INR
INR: 1.04
Prothrombin Time: 13.6 seconds (ref 11.4–15.2)

## 2016-02-08 LAB — I-STAT TROPONIN, ED: Troponin i, poc: 0 ng/mL (ref 0.00–0.08)

## 2016-02-08 LAB — SAMPLE TO BLOOD BANK

## 2016-02-08 LAB — ETHANOL

## 2016-02-08 MED ORDER — ACETAMINOPHEN 325 MG PO TABS
650.0000 mg | ORAL_TABLET | Freq: Four times a day (QID) | ORAL | Status: DC | PRN
Start: 2016-02-08 — End: 2016-02-09
  Administered 2016-02-09: 650 mg via ORAL
  Filled 2016-02-08: qty 2

## 2016-02-08 MED ORDER — TETANUS-DIPHTH-ACELL PERTUSSIS 5-2.5-18.5 LF-MCG/0.5 IM SUSP
0.5000 mL | Freq: Once | INTRAMUSCULAR | Status: AC
  Administered 2016-02-08: 0.5 mL via INTRAMUSCULAR
  Filled 2016-02-08: qty 0.5

## 2016-02-08 MED ORDER — HYDROCODONE-ACETAMINOPHEN 5-325 MG PO TABS: 1.0000 | ORAL_TABLET | ORAL | Status: DC | PRN

## 2016-02-08 MED ORDER — TETANUS-DIPHTHERIA TOXOIDS TD 5-2 LFU IM INJ: 0.5000 mL | INJECTION | Freq: Once | INTRAMUSCULAR | Status: DC

## 2016-02-08 MED ORDER — SODIUM CHLORIDE 0.9% FLUSH: 3.0000 mL | Freq: Two times a day (BID) | INTRAVENOUS | Status: DC

## 2016-02-08 MED ORDER — SODIUM CHLORIDE 0.9 % IV SOLN: 250.0000 mL | INTRAVENOUS | Status: DC | PRN

## 2016-02-08 MED ORDER — POTASSIUM CHLORIDE CRYS ER 20 MEQ PO TBCR
40.0000 meq | EXTENDED_RELEASE_TABLET | Freq: Once | ORAL | Status: AC
  Administered 2016-02-08: 40 meq via ORAL
  Filled 2016-02-08: qty 2

## 2016-02-08 MED ORDER — SODIUM CHLORIDE 0.9% FLUSH: 3.0000 mL | INTRAVENOUS | Status: DC | PRN

## 2016-02-08 MED ORDER — ACETAMINOPHEN 650 MG RE SUPP: 650.0000 mg | Freq: Four times a day (QID) | RECTAL | Status: DC | PRN

## 2016-02-08 MED ORDER — WHITE PETROLATUM GEL
Status: AC
  Filled 2016-02-08: qty 1

## 2016-02-08 MED ORDER — ACETAMINOPHEN 325 MG PO TABS
650.0000 mg | ORAL_TABLET | Freq: Once | ORAL | Status: AC
  Administered 2016-02-08: 650 mg via ORAL
  Filled 2016-02-08: qty 2
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
## 2016-02-08 NOTE — ED Notes (Signed)
Attempted report 
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
## 2016-02-08 NOTE — ED Notes (Signed)
Pt c/o lower back pain from the way she is lying on the stretcher
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
## 2016-02-08 NOTE — ED Notes (Signed)
Admitting doctor at the bedside 

## 2016-02-08 NOTE — ED Notes (Signed)
Dr Grandville Silos at the bedside
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
## 2016-02-08 NOTE — ED Notes (Signed)
Admitting MD at bedside.

## 2016-02-08 NOTE — ED Notes (Signed)
Pt speaking on phone with family.  Denies pain.  C-collar removed by MD.

## 2016-02-09 ENCOUNTER — Encounter (HOSPITAL_COMMUNITY): Payer: Self-pay | Admitting: *Deleted

## 2016-02-09 ENCOUNTER — Observation Stay (HOSPITAL_BASED_OUTPATIENT_CLINIC_OR_DEPARTMENT_OTHER): Payer: Medicare Other

## 2016-02-09 DIAGNOSIS — R55 Syncope and collapse: Secondary | ICD-10-CM | POA: Diagnosis not present

## 2016-02-09 DIAGNOSIS — E785 Hyperlipidemia, unspecified: Secondary | ICD-10-CM | POA: Diagnosis not present

## 2016-02-09 DIAGNOSIS — I10 Essential (primary) hypertension: Secondary | ICD-10-CM

## 2016-02-09 DIAGNOSIS — K279 Peptic ulcer, site unspecified, unspecified as acute or chronic, without hemorrhage or perforation: Secondary | ICD-10-CM

## 2016-02-09 LAB — ECHOCARDIOGRAM COMPLETE
AOVTI: 45.6 cm
AV Area mean vel: 2.06 cm2
AV VEL mean LVOT/AV: 0.81
AV area mean vel ind: 1.09 cm2/m2
AV vel: 1.94
AVA: 1.94 cm2
AVAREAVTI: 2.09 cm2
AVAREAVTIIND: 1.02 cm2/m2
AVG: 11 mmHg
AVLVOTPG: 12 mmHg
AVPG: 18 mmHg
AVPKVEL: 214 cm/s
Ao pk vel: 0.82 m/s
CHL CUP AV PEAK INDEX: 1.1
CHL CUP MV DEC (S): 180
DOP CAL AO MEAN VELOCITY: 155 cm/s
EERAT: 12.58
EWDT: 180 ms
FS: 36 % (ref 28–44)
Height: 61 in
IV/PV OW: 1.05
LA diam index: 1.26 cm/m2
LA vol: 32.6 mL
LASIZE: 24 mm
LAVOLA4C: 27.5 mL
LAVOLIN: 17.1 mL/m2
LEFT ATRIUM END SYS DIAM: 24 mm
LV PW d: 12.1 mm — AB (ref 0.6–1.1)
LV TDI E'LATERAL: 5.98
LVEEAVG: 12.58
LVEEMED: 12.58
LVELAT: 5.98 cm/s
LVOT VTI: 34.8 cm
LVOT area: 2.54 cm2
LVOT peak vel: 176 cm/s
LVOTD: 18 mm
LVOTSV: 88 mL
LVOTVTI: 0.76 cm
MV pk A vel: 117 m/s
MV pk E vel: 75.2 m/s
MVPG: 2 mmHg
TDI e' medial: 5.98
Valve area index: 1.02
Weight: 2853.63 oz

## 2016-02-09 LAB — BASIC METABOLIC PANEL
Anion gap: 8 (ref 5–15)
BUN: 18 mg/dL (ref 6–20)
CHLORIDE: 104 mmol/L (ref 101–111)
CO2: 27 mmol/L (ref 22–32)
CREATININE: 1.05 mg/dL — AB (ref 0.44–1.00)
Calcium: 9.2 mg/dL (ref 8.9–10.3)
GFR calc non Af Amer: 49 mL/min — ABNORMAL LOW (ref >60)
GFR, EST AFRICAN AMERICAN: 57 mL/min — AB (ref >60)
Glucose, Bld: 100 mg/dL — ABNORMAL HIGH (ref 65–99)
POTASSIUM: 3.3 mmol/L — AB (ref 3.5–5.1)
SODIUM: 139 mmol/L (ref 135–145)

## 2016-02-09 LAB — CBC
HEMATOCRIT: 34.6 % — AB (ref 36.0–46.0)
Hemoglobin: 11.5 g/dL — ABNORMAL LOW (ref 12.0–15.0)
MCH: 31 pg (ref 26.0–34.0)
MCHC: 33.2 g/dL (ref 30.0–36.0)
MCV: 93.3 fL (ref 78.0–100.0)
Platelets: 267 10*3/uL (ref 150–400)
RBC: 3.71 MIL/uL — AB (ref 3.87–5.11)
RDW: 13.2 % (ref 11.5–15.5)
WBC: 14 10*3/uL — AB (ref 4.0–10.5)

## 2016-02-09 LAB — MRSA PCR SCREENING: MRSA by PCR: POSITIVE — AB

## 2016-02-09 MED ORDER — MAGNESIUM SULFATE 2 GM/50ML IV SOLN
2.0000 g | Freq: Once | INTRAVENOUS | Status: AC
  Administered 2016-02-09: 2 g via INTRAVENOUS
  Filled 2016-02-09: qty 50

## 2016-02-09 MED ORDER — CHLORHEXIDINE GLUCONATE CLOTH 2 % EX PADS
6.0000 | MEDICATED_PAD | Freq: Every day | CUTANEOUS | Status: DC
  Administered 2016-02-09 – 2016-02-10 (×2): 6 via TOPICAL

## 2016-02-09 MED ORDER — HYDROCHLOROTHIAZIDE 25 MG PO TABS
12.5000 mg | ORAL_TABLET | Freq: Every day | ORAL | Status: DC
  Administered 2016-02-10: 12.5 mg via ORAL
  Filled 2016-02-09 (×2): qty 1

## 2016-02-09 MED ORDER — MUPIROCIN 2 % EX OINT
1.0000 "application " | TOPICAL_OINTMENT | Freq: Two times a day (BID) | CUTANEOUS | Status: DC
  Administered 2016-02-09 – 2016-02-10 (×3): 1 via NASAL
  Filled 2016-02-09: qty 22

## 2016-02-09 MED ORDER — ACETAMINOPHEN 325 MG PO TABS
650.0000 mg | ORAL_TABLET | Freq: Four times a day (QID) | ORAL | Status: DC
  Administered 2016-02-09 – 2016-02-10 (×4): 650 mg via ORAL
  Filled 2016-02-09 (×4): qty 2

## 2016-02-09 MED ORDER — MECLIZINE HCL 25 MG PO TABS
25.0000 mg | ORAL_TABLET | Freq: Three times a day (TID) | ORAL | Status: DC | PRN
  Administered 2016-02-09 – 2016-02-10 (×2): 25 mg via ORAL
  Filled 2016-02-09 (×4): qty 1

## 2016-02-09 MED ORDER — ATORVASTATIN CALCIUM 20 MG PO TABS
20.0000 mg | ORAL_TABLET | Freq: Every day | ORAL | Status: DC
  Administered 2016-02-09 – 2016-02-10 (×2): 20 mg via ORAL
  Filled 2016-02-09 (×3): qty 1

## 2016-02-09 MED ORDER — PANTOPRAZOLE SODIUM 40 MG PO TBEC
40.0000 mg | DELAYED_RELEASE_TABLET | Freq: Every day | ORAL | Status: DC
  Administered 2016-02-09 – 2016-02-10 (×2): 40 mg via ORAL
  Filled 2016-02-09 (×3): qty 1

## 2016-02-09 MED ORDER — POTASSIUM CHLORIDE CRYS ER 20 MEQ PO TBCR
40.0000 meq | EXTENDED_RELEASE_TABLET | Freq: Once | ORAL | Status: AC
  Administered 2016-02-09: 40 meq via ORAL
  Filled 2016-02-09: qty 2

## 2016-02-09 NOTE — Care Management Obs Status (Signed)
[facility] NOTIFICATION   Patient Details  Name: Valerie Cooper MRN: [id number] Date of Birth: [birth year]   Medicare Observation Status Notification Given:  Yes    Marilu Favre, RN 02/09/2016, 9:54 AM

## 2016-02-09 NOTE — Care Management Note (Signed)
Case Management Note  Patient Details  Name: Valerie Cooper MRN: [id number] Date of Birth: 1933/12/20  Subjective/Objective:                    Action/Plan: Spoke with patient and sister Opal Sidles [id number] at bedside. Patient will have 24 hour supervision at home provided by family . Patient discharging to sister Jane's home which is [address], [place], [place] [address]    Expected Discharge Date:  02/10/16               Expected Discharge Plan:  [place]  In-House Referral:     Discharge planning Services  CM Consult  Post Acute Care Choice:  Durable Medical Equipment, Home Health Choice offered to:  Patient, Sibling  DME Arranged:  3-N-1, Walker rolling DME Agency:     HH Arranged:  PT [place]:  [place]  Status of Service:  Completed, signed off  If discussed at [place] of Stay Meetings, dates discussed:    Additional Comments:  Marilu Favre, RN 02/09/2016, 9:51 AM
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
## 2016-02-09 NOTE — Progress Notes (Signed)
  Echocardiogram 2D Echocardiogram has been performed.  Valerie Cooper 02/09/2016, 2:26 PM
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
## 2016-02-10 DIAGNOSIS — R269 Unspecified abnormalities of gait and mobility: Secondary | ICD-10-CM | POA: Diagnosis not present

## 2016-02-10 DIAGNOSIS — I10 Essential (primary) hypertension: Secondary | ICD-10-CM | POA: Diagnosis not present

## 2016-02-10 DIAGNOSIS — E785 Hyperlipidemia, unspecified: Secondary | ICD-10-CM | POA: Diagnosis not present

## 2016-02-10 LAB — CBC
HCT: 36.9 % (ref 36.0–46.0)
HEMOGLOBIN: 12 g/dL (ref 12.0–15.0)
MCH: 30.8 pg (ref 26.0–34.0)
MCHC: 32.5 g/dL (ref 30.0–36.0)
MCV: 94.9 fL (ref 78.0–100.0)
Platelets: 238 10*3/uL (ref 150–400)
RBC: 3.89 MIL/uL (ref 3.87–5.11)
RDW: 13.5 % (ref 11.5–15.5)
WBC: 12.8 10*3/uL — AB (ref 4.0–10.5)

## 2016-02-10 LAB — BASIC METABOLIC PANEL
ANION GAP: 9 (ref 5–15)
BUN: 17 mg/dL (ref 6–20)
CHLORIDE: 103 mmol/L (ref 101–111)
CO2: 27 mmol/L (ref 22–32)
Calcium: 9.2 mg/dL (ref 8.9–10.3)
Creatinine, Ser: 1.11 mg/dL — ABNORMAL HIGH (ref 0.44–1.00)
GFR calc Af Amer: 53 mL/min — ABNORMAL LOW (ref >60)
GFR, EST NON AFRICAN AMERICAN: 46 mL/min — AB (ref >60)
Glucose, Bld: 99 mg/dL (ref 65–99)
POTASSIUM: 3.8 mmol/L (ref 3.5–5.1)
SODIUM: 139 mmol/L (ref 135–145)

## 2016-02-10 MED ORDER — CHLORHEXIDINE GLUCONATE CLOTH 2 % EX PADS: 6.0000 | MEDICATED_PAD | Freq: Every day | CUTANEOUS | 0 refills | Status: AC

## 2016-02-10 MED ORDER — MECLIZINE HCL 25 MG PO TABS: 25.0000 mg | ORAL_TABLET | Freq: Three times a day (TID) | ORAL | 0 refills | Status: DC | PRN

## 2016-02-10 MED ORDER — MUPIROCIN 2 % EX OINT: 1.0000 "application " | TOPICAL_OINTMENT | Freq: Two times a day (BID) | CUTANEOUS | 0 refills | Status: DC

## 2016-02-10 MED ORDER — ACETAMINOPHEN 325 MG PO TABS: 650.0000 mg | ORAL_TABLET | Freq: Four times a day (QID) | ORAL | Status: DC

## 2016-02-10 MED ORDER — HYDROCHLOROTHIAZIDE 12.5 MG PO TABS: 12.5000 mg | ORAL_TABLET | Freq: Every day | ORAL | 0 refills | Status: DC
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
## 2016-02-10 NOTE — Progress Notes (Signed)
Patient discharged to home with instructions. 
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
## 2016-02-11 DIAGNOSIS — M1990 Unspecified osteoarthritis, unspecified site: Secondary | ICD-10-CM | POA: Diagnosis not present

## 2016-02-11 DIAGNOSIS — M810 Age-related osteoporosis without current pathological fracture: Secondary | ICD-10-CM | POA: Diagnosis not present

## 2016-02-11 DIAGNOSIS — I10 Essential (primary) hypertension: Secondary | ICD-10-CM | POA: Diagnosis not present

## 2016-02-11 DIAGNOSIS — K279 Peptic ulcer, site unspecified, unspecified as acute or chronic, without hemorrhage or perforation: Secondary | ICD-10-CM | POA: Diagnosis not present

## 2016-02-11 DIAGNOSIS — R262 Difficulty in walking, not elsewhere classified: Secondary | ICD-10-CM | POA: Diagnosis not present

## 2016-02-12 DIAGNOSIS — H8111 Benign paroxysmal vertigo, right ear: Secondary | ICD-10-CM | POA: Insufficient documentation

## 2016-02-12 DIAGNOSIS — S0993XD Unspecified injury of face, subsequent encounter: Secondary | ICD-10-CM | POA: Diagnosis not present

## 2016-02-12 DIAGNOSIS — E876 Hypokalemia: Secondary | ICD-10-CM | POA: Diagnosis not present

## 2016-02-12 DIAGNOSIS — Z23 Encounter for immunization: Secondary | ICD-10-CM | POA: Diagnosis not present

## 2016-02-19 ENCOUNTER — Ambulatory Visit: Payer: Medicare Other | Attending: Otolaryngology

## 2016-02-19 DIAGNOSIS — H8113 Benign paroxysmal vertigo, bilateral: Secondary | ICD-10-CM | POA: Diagnosis not present

## 2016-02-19 DIAGNOSIS — R2689 Other abnormalities of gait and mobility: Secondary | ICD-10-CM

## 2016-02-19 DIAGNOSIS — R42 Dizziness and giddiness: Secondary | ICD-10-CM
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
## 2016-02-21 DIAGNOSIS — S8982XD Other specified injuries of left lower leg, subsequent encounter: Secondary | ICD-10-CM | POA: Diagnosis not present

## 2016-02-26 ENCOUNTER — Ambulatory Visit: Payer: Medicare Other | Admitting: Physical Therapy

## 2016-03-10 ENCOUNTER — Ambulatory Visit: Payer: Medicare Other | Admitting: Physical Therapy

## 2016-03-15 DIAGNOSIS — R51 Headache: Secondary | ICD-10-CM | POA: Diagnosis not present

## 2016-03-18 ENCOUNTER — Ambulatory Visit: Payer: Medicare Other

## 2016-03-25 ENCOUNTER — Ambulatory Visit: Payer: Medicare Other

## 2016-03-25 NOTE — Therapy (Signed)
[facility] [address] [place], [place], [address] Phone: [phone]   Fax:  [phone]  Patient Details  Name: Valerie Cooper MRN: [id number] Date of Birth: 26-Apr-1933 Referring Provider:  No ref. provider found  Encounter Date: 03-29-2016  PHYSICAL THERAPY DISCHARGE SUMMARY  Visits from Start of Care: 1  Current functional level related to goals / functional outcomes:     PT Long Term Goals - 02/19/16 1257      PT LONG TERM GOAL #1   Title Pt will be IND in HEP to improve dizziness and balance. TARGET DATES FOR ALL LTGS: 03/18/16   Status New     PT LONG TERM GOAL #2   Title Pt will report no dizziness during positional testing in order to perform ADLs safely.    Status New     PT LONG TERM GOAL #3   Title Pt will improve DHI score from 80% to 62% to improve quality of life.   Status New     PT LONG TERM GOAL #4   Title Pt will amb. 500' over even/paved surfaces at IND level, while performing head turns, with dizziness not incr. >1 point to improve functional mobility.    Status New     PT LONG TERM GOAL #5   Title Perform FGA and write goal prn.    Status New       Remaining deficits: Unknown, as pt never returned, as she reported she felt better when she called and cancelled remaining appt's. D/c per pt request.   Education / Equipment: PT frequency/duration  Plan: Patient agrees to discharge.  Patient goals were not met. Patient is being discharged due to not returning since the last visit.  ?????           G-Codes - 03-29-16 1527    Functional Assessment Tool Used DHI: 80% (same as eval as pt never returned)   Functional Limitation Self care   Self Care Current Status (E4235) At least 80 percent but less than 100 percent impaired, limited or restricted   Self Care Goal Status (T6144) At least 1 percent but less than 20 percent impaired, limited or restricted   Self Care Discharge Status  [phone]) At least 40 percent but less than 60 percent impaired, limited or restricted       [name] L 03-29-16, 3:27 PM  [facility] [address] [place], [place], [address] Phone: [phone]   Fax:  [phone]   Geoffry Paradise, PT,DPT 03/29/16 3:29 PM Phone: [phone] Fax: [phone]

## 2016-04-03 DIAGNOSIS — R51 Headache: Secondary | ICD-10-CM | POA: Diagnosis not present

## 2016-04-03 DIAGNOSIS — R42 Dizziness and giddiness: Secondary | ICD-10-CM | POA: Diagnosis not present

## 2016-04-03 DIAGNOSIS — I10 Essential (primary) hypertension: Secondary | ICD-10-CM | POA: Diagnosis not present

## 2016-04-09 DIAGNOSIS — I10 Essential (primary) hypertension: Secondary | ICD-10-CM | POA: Diagnosis not present

## 2016-04-23 DIAGNOSIS — I10 Essential (primary) hypertension: Secondary | ICD-10-CM | POA: Diagnosis not present

## 2016-04-23 DIAGNOSIS — R000 Tachycardia, unspecified: Secondary | ICD-10-CM | POA: Diagnosis not present

## 2016-04-30 ENCOUNTER — Other Ambulatory Visit: Payer: Self-pay | Admitting: Internal Medicine

## 2016-04-30 DIAGNOSIS — Z1231 Encounter for screening mammogram for malignant neoplasm of breast: Secondary | ICD-10-CM

## 2016-05-11 DIAGNOSIS — Z6832 Body mass index (BMI) 32.0-32.9, adult: Secondary | ICD-10-CM | POA: Diagnosis not present

## 2016-05-11 DIAGNOSIS — E7800 Pure hypercholesterolemia, unspecified: Secondary | ICD-10-CM | POA: Diagnosis not present

## 2016-05-11 DIAGNOSIS — Z1389 Encounter for screening for other disorder: Secondary | ICD-10-CM | POA: Diagnosis not present

## 2016-05-11 DIAGNOSIS — I10 Essential (primary) hypertension: Secondary | ICD-10-CM | POA: Diagnosis not present

## 2016-05-11 DIAGNOSIS — Z0000 Encounter for general adult medical examination without abnormal findings: Secondary | ICD-10-CM | POA: Diagnosis not present

## 2016-05-11 DIAGNOSIS — M542 Cervicalgia: Secondary | ICD-10-CM | POA: Diagnosis not present

## 2016-05-11 DIAGNOSIS — M545 Low back pain: Secondary | ICD-10-CM | POA: Diagnosis not present

## 2016-05-11 DIAGNOSIS — E663 Overweight: Secondary | ICD-10-CM | POA: Diagnosis not present

## 2016-05-19 DIAGNOSIS — H1013 Acute atopic conjunctivitis, bilateral: Secondary | ICD-10-CM | POA: Diagnosis not present

## 2016-06-03 ENCOUNTER — Ambulatory Visit: Payer: Medicare Other

## 2016-06-09 ENCOUNTER — Ambulatory Visit: Payer: Medicare Other

## 2016-06-09 DIAGNOSIS — R197 Diarrhea, unspecified: Secondary | ICD-10-CM | POA: Diagnosis not present

## 2016-06-22 DIAGNOSIS — E7800 Pure hypercholesterolemia, unspecified: Secondary | ICD-10-CM | POA: Diagnosis not present

## 2016-06-22 DIAGNOSIS — Z9189 Other specified personal risk factors, not elsewhere classified: Secondary | ICD-10-CM | POA: Diagnosis not present

## 2016-06-22 DIAGNOSIS — I10 Essential (primary) hypertension: Secondary | ICD-10-CM | POA: Diagnosis not present

## 2016-08-03 ENCOUNTER — Encounter (HOSPITAL_COMMUNITY): Payer: Self-pay | Admitting: Emergency Medicine

## 2016-08-03 ENCOUNTER — Emergency Department (HOSPITAL_COMMUNITY)
Admission: EM | Admit: 2016-08-03 | Discharge: 2016-08-03 | Disposition: A | Discharge status: Home / Self Care | Payer: Medicare Other | Attending: Emergency Medicine | Admitting: Emergency Medicine

## 2016-08-03 DIAGNOSIS — R42 Dizziness and giddiness: Secondary | ICD-10-CM | POA: Diagnosis not present

## 2016-08-03 DIAGNOSIS — Z79899 Other long term (current) drug therapy: Secondary | ICD-10-CM | POA: Diagnosis not present

## 2016-08-03 DIAGNOSIS — I10 Essential (primary) hypertension: Secondary | ICD-10-CM | POA: Diagnosis not present

## 2016-08-03 MED ORDER — MECLIZINE HCL 25 MG PO TABS: 25.0000 mg | ORAL_TABLET | Freq: Three times a day (TID) | ORAL | 0 refills | Status: DC | PRN

## 2016-08-03 NOTE — ED Triage Notes (Signed)
Patient from home complaining of intermittent dizziness and "fuzzy headache" that started on waking yesterday.  Patient concerned she may have seasonal allergies.  Denies difficulty ambulating, sinus congestion.  No focal neuro deficits, denies pain at this time.  Patient alert and oriented and in no apparent distress.
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
## 2016-09-01 ENCOUNTER — Ambulatory Visit: Payer: Medicare Other

## 2016-09-17 ENCOUNTER — Ambulatory Visit
Admission: RE | Admit: 2016-09-17 | Discharge: 2016-09-17 | Disposition: A | Discharge status: Home / Self Care | Payer: Medicare Other | Source: Ambulatory Visit | Attending: Internal Medicine | Admitting: Internal Medicine

## 2016-09-17 DIAGNOSIS — Z1231 Encounter for screening mammogram for malignant neoplasm of breast: Secondary | ICD-10-CM | POA: Diagnosis not present

## 2016-09-26 ENCOUNTER — Emergency Department (HOSPITAL_COMMUNITY)
Admission: EM | Admit: 2016-09-26 | Discharge: 2016-09-26 | Disposition: A | Discharge status: Home / Self Care | Payer: Medicare Other | Attending: Emergency Medicine | Admitting: Emergency Medicine

## 2016-09-26 ENCOUNTER — Encounter (HOSPITAL_COMMUNITY): Payer: Self-pay

## 2016-09-26 ENCOUNTER — Emergency Department (HOSPITAL_COMMUNITY): Payer: Medicare Other

## 2016-09-26 DIAGNOSIS — I10 Essential (primary) hypertension: Secondary | ICD-10-CM | POA: Diagnosis not present

## 2016-09-26 DIAGNOSIS — G4489 Other headache syndrome: Secondary | ICD-10-CM | POA: Diagnosis not present

## 2016-09-26 DIAGNOSIS — R51 Headache: Secondary | ICD-10-CM | POA: Diagnosis not present

## 2016-09-26 DIAGNOSIS — Z79899 Other long term (current) drug therapy: Secondary | ICD-10-CM | POA: Diagnosis not present
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
## 2016-09-26 NOTE — ED Triage Notes (Signed)
Patient complains of headache after eating salad at restaurant last night. Also drank tea and states that she has had been up to bathroom several times during the night

## 2016-11-12 ENCOUNTER — Other Ambulatory Visit: Payer: Self-pay | Admitting: Internal Medicine

## 2016-11-12 DIAGNOSIS — R109 Unspecified abdominal pain: Secondary | ICD-10-CM

## 2016-11-12 DIAGNOSIS — N182 Chronic kidney disease, stage 2 (mild): Secondary | ICD-10-CM | POA: Diagnosis not present

## 2016-11-12 DIAGNOSIS — I10 Essential (primary) hypertension: Secondary | ICD-10-CM | POA: Diagnosis not present

## 2016-11-12 DIAGNOSIS — E7800 Pure hypercholesterolemia, unspecified: Secondary | ICD-10-CM | POA: Diagnosis not present

## 2016-11-19 ENCOUNTER — Ambulatory Visit
Admission: RE | Admit: 2016-11-19 | Discharge: 2016-11-19 | Disposition: A | Discharge status: Home / Self Care | Payer: Medicare Other | Source: Ambulatory Visit | Attending: Internal Medicine | Admitting: Internal Medicine

## 2016-11-19 DIAGNOSIS — K5730 Diverticulosis of large intestine without perforation or abscess without bleeding: Secondary | ICD-10-CM | POA: Diagnosis not present

## 2016-11-19 DIAGNOSIS — K429 Umbilical hernia without obstruction or gangrene: Secondary | ICD-10-CM | POA: Diagnosis not present

## 2016-11-19 DIAGNOSIS — R109 Unspecified abdominal pain: Secondary | ICD-10-CM

## 2016-11-19 MED ORDER — IOPAMIDOL (ISOVUE-300) INJECTION 61%
100.0000 mL | Freq: Once | INTRAVENOUS | Status: AC | PRN
  Administered 2016-11-19: 100 mL via INTRAVENOUS

## 2016-11-23 DIAGNOSIS — R1030 Lower abdominal pain, unspecified: Secondary | ICD-10-CM | POA: Diagnosis not present

## 2016-12-02 ENCOUNTER — Encounter (HOSPITAL_COMMUNITY): Payer: Self-pay

## 2016-12-02 ENCOUNTER — Emergency Department (HOSPITAL_COMMUNITY)
Admission: EM | Admit: 2016-12-02 | Discharge: 2016-12-02 | Disposition: A | Discharge status: Home / Self Care | Payer: Medicare Other | Attending: Emergency Medicine | Admitting: Emergency Medicine

## 2016-12-02 DIAGNOSIS — I10 Essential (primary) hypertension: Secondary | ICD-10-CM | POA: Insufficient documentation

## 2016-12-02 DIAGNOSIS — R1013 Epigastric pain: Secondary | ICD-10-CM | POA: Diagnosis present

## 2016-12-02 DIAGNOSIS — Z79899 Other long term (current) drug therapy: Secondary | ICD-10-CM | POA: Insufficient documentation

## 2016-12-02 DIAGNOSIS — K219 Gastro-esophageal reflux disease without esophagitis: Secondary | ICD-10-CM | POA: Insufficient documentation

## 2016-12-02 LAB — I-STAT CG4 LACTIC ACID, ED: LACTIC ACID, VENOUS: 1.02 mmol/L (ref 0.5–1.9)

## 2016-12-02 LAB — COMPREHENSIVE METABOLIC PANEL
ALT: 16 U/L (ref 14–54)
AST: 20 U/L (ref 15–41)
Albumin: 4.4 g/dL (ref 3.5–5.0)
Alkaline Phosphatase: 41 U/L (ref 38–126)
Anion gap: 8 (ref 5–15)
BUN: 25 mg/dL — AB (ref 6–20)
CHLORIDE: 104 mmol/L (ref 101–111)
CO2: 26 mmol/L (ref 22–32)
CREATININE: 1.1 mg/dL — AB (ref 0.44–1.00)
Calcium: 9.7 mg/dL (ref 8.9–10.3)
GFR calc non Af Amer: 46 mL/min — ABNORMAL LOW (ref >60)
GFR, EST AFRICAN AMERICAN: 53 mL/min — AB (ref >60)
Glucose, Bld: 99 mg/dL (ref 65–99)
POTASSIUM: 3.8 mmol/L (ref 3.5–5.1)
SODIUM: 138 mmol/L (ref 135–145)
Total Bilirubin: 0.9 mg/dL (ref 0.3–1.2)
Total Protein: 7.8 g/dL (ref 6.5–8.1)

## 2016-12-02 LAB — CBC WITH DIFFERENTIAL/PLATELET
Basophils Absolute: 0 10*3/uL (ref 0.0–0.1)
Basophils Relative: 0 %
EOS ABS: 0 10*3/uL (ref 0.0–0.7)
Eosinophils Relative: 0 %
HCT: 39.7 % (ref 36.0–46.0)
HEMOGLOBIN: 13.3 g/dL (ref 12.0–15.0)
LYMPHS ABS: 1.6 10*3/uL (ref 0.7–4.0)
LYMPHS PCT: 12 %
MCH: 31.1 pg (ref 26.0–34.0)
MCHC: 33.5 g/dL (ref 30.0–36.0)
MCV: 92.8 fL (ref 78.0–100.0)
MONOS PCT: 5 %
Monocytes Absolute: 0.6 10*3/uL (ref 0.1–1.0)
NEUTROS PCT: 83 %
Neutro Abs: 10.8 10*3/uL — ABNORMAL HIGH (ref 1.7–7.7)
Platelets: 332 10*3/uL (ref 150–400)
RBC: 4.28 MIL/uL (ref 3.87–5.11)
RDW: 13.1 % (ref 11.5–15.5)
WBC: 12.9 10*3/uL — ABNORMAL HIGH (ref 4.0–10.5)

## 2016-12-02 MED ORDER — RANITIDINE HCL 150 MG PO TABS: 150.0000 mg | ORAL_TABLET | Freq: Two times a day (BID) | ORAL | 0 refills | Status: DC
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
## 2016-12-02 NOTE — ED Provider Notes (Signed)
Medical screening examination/treatment/procedure(s) were conducted as a shared visit with non-physician practitioner(s) and myself.  I personally evaluated the patient during the encounter.   EKG Interpretation None     Patient here complaining of persistent chronic right-sided abdominal pain. She has no fever or chills. Recently treated with Keflex which made her feel weak but that is since improved. Denies any infectious symptoms. Abdominal exam is benign. Return precautions given   Lacretia Leigh, MD 12/02/16 [phone]

## 2016-12-02 NOTE — ED Triage Notes (Signed)
Pt states she had CT scan last week and was told she had an infection in her groin. Pt was taking keflex but had to stop due to side effects. Pt states she still feels "groggy." and is concerned she still has infection. P AOX4. Ambulatory.

## 2016-12-02 NOTE — Discharge Instructions (Signed)
You may take Zantac up to 2 times daily when you have symptoms of burning pain after eating.  Please follow-up with your primary care provider at [facility] in the next 3-4 weeks to have your lymph nodes rechecked.  Please do not take any more Keflex. If you develop new or worsening symptoms, such as nausea, diarrhea, fever, or chills, please return to the emergency department for reevaluation. If your burning pain persists, but does not worsen, please follow-up with your primary care provider at [facility].

## 2017-02-08 DIAGNOSIS — R109 Unspecified abdominal pain: Secondary | ICD-10-CM | POA: Diagnosis not present

## 2017-02-08 DIAGNOSIS — I10 Essential (primary) hypertension: Secondary | ICD-10-CM | POA: Diagnosis not present

## 2017-02-21 DIAGNOSIS — R0789 Other chest pain: Secondary | ICD-10-CM | POA: Diagnosis not present

## 2017-03-16 ENCOUNTER — Other Ambulatory Visit: Payer: Self-pay | Admitting: Nurse Practitioner

## 2017-03-16 ENCOUNTER — Ambulatory Visit
Admission: RE | Admit: 2017-03-16 | Discharge: 2017-03-16 | Disposition: A | Discharge status: Home / Self Care | Payer: Medicare Other | Source: Ambulatory Visit | Attending: Nurse Practitioner | Admitting: Nurse Practitioner

## 2017-03-16 DIAGNOSIS — R1012 Left upper quadrant pain: Secondary | ICD-10-CM | POA: Diagnosis not present

## 2017-03-16 DIAGNOSIS — S22080A Wedge compression fracture of T11-T12 vertebra, initial encounter for closed fracture: Secondary | ICD-10-CM

## 2017-03-16 DIAGNOSIS — R42 Dizziness and giddiness: Secondary | ICD-10-CM | POA: Diagnosis not present

## 2017-03-16 DIAGNOSIS — S22080D Wedge compression fracture of T11-T12 vertebra, subsequent encounter for fracture with routine healing: Secondary | ICD-10-CM | POA: Diagnosis not present

## 2017-06-13 DIAGNOSIS — N182 Chronic kidney disease, stage 2 (mild): Secondary | ICD-10-CM | POA: Diagnosis not present

## 2017-06-13 DIAGNOSIS — E7800 Pure hypercholesterolemia, unspecified: Secondary | ICD-10-CM | POA: Diagnosis not present

## 2017-06-13 DIAGNOSIS — I10 Essential (primary) hypertension: Secondary | ICD-10-CM | POA: Diagnosis not present

## 2017-06-13 DIAGNOSIS — Z0000 Encounter for general adult medical examination without abnormal findings: Secondary | ICD-10-CM | POA: Diagnosis not present

## 2017-07-13 ENCOUNTER — Other Ambulatory Visit: Payer: Self-pay | Admitting: Internal Medicine

## 2017-07-13 ENCOUNTER — Ambulatory Visit
Admission: RE | Admit: 2017-07-13 | Discharge: 2017-07-13 | Disposition: A | Discharge status: Home / Self Care | Payer: Medicare Other | Source: Ambulatory Visit | Attending: Internal Medicine | Admitting: Internal Medicine

## 2017-07-13 DIAGNOSIS — Z1211 Encounter for screening for malignant neoplasm of colon: Secondary | ICD-10-CM | POA: Diagnosis not present

## 2017-07-13 DIAGNOSIS — M25552 Pain in left hip: Secondary | ICD-10-CM

## 2017-07-13 DIAGNOSIS — M25559 Pain in unspecified hip: Secondary | ICD-10-CM | POA: Diagnosis not present

## 2017-07-13 DIAGNOSIS — M25562 Pain in left knee: Secondary | ICD-10-CM | POA: Diagnosis not present

## 2017-09-19 DIAGNOSIS — J321 Chronic frontal sinusitis: Secondary | ICD-10-CM | POA: Diagnosis not present

## 2017-09-19 DIAGNOSIS — K529 Noninfective gastroenteritis and colitis, unspecified: Secondary | ICD-10-CM | POA: Diagnosis not present

## 2017-10-10 ENCOUNTER — Other Ambulatory Visit: Payer: Self-pay | Admitting: Internal Medicine

## 2017-10-10 DIAGNOSIS — Z1231 Encounter for screening mammogram for malignant neoplasm of breast: Secondary | ICD-10-CM

## 2017-11-03 IMAGING — CT CT HEAD W/O CM
3 series · 16 of 47 positions shown, 19 images · non-contrast
Comparison: Head CT scan 02/08/2016.

CLINICAL DATA: Headache and dizziness since [DATE] last night.

EXAM:
CT HEAD WITHOUT CONTRAST
TECHNIQUE: Contiguous axial images were obtained from the base of the skull
through the vertex without intravenous contrast.

[Series 4: head 5.0 h30s · axial · 0.46mm/px · z∈[-243,-103]mm · 10 of 34 slices shown, 13 images]
[im 3/34  brain]
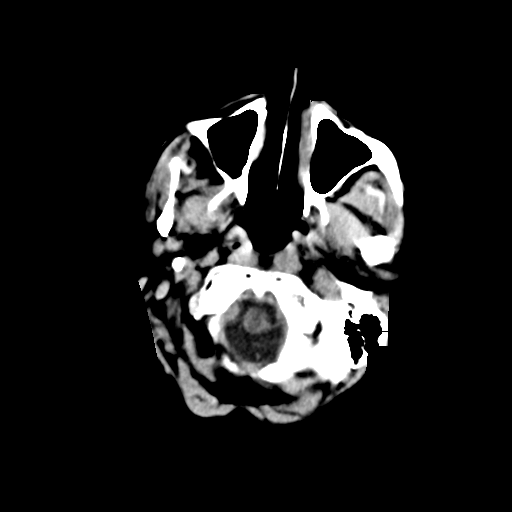
[im 3/34  bone]
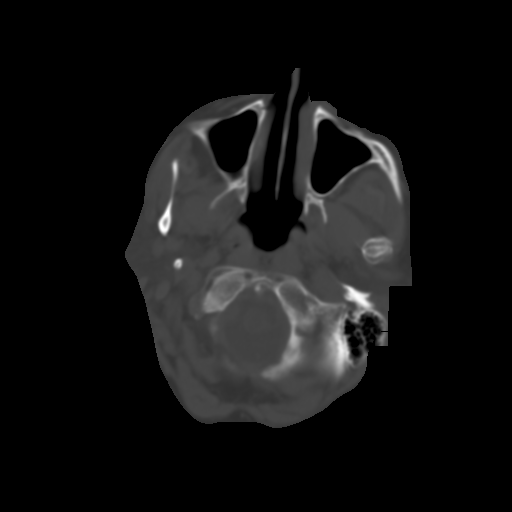
[im 6/34  brain]
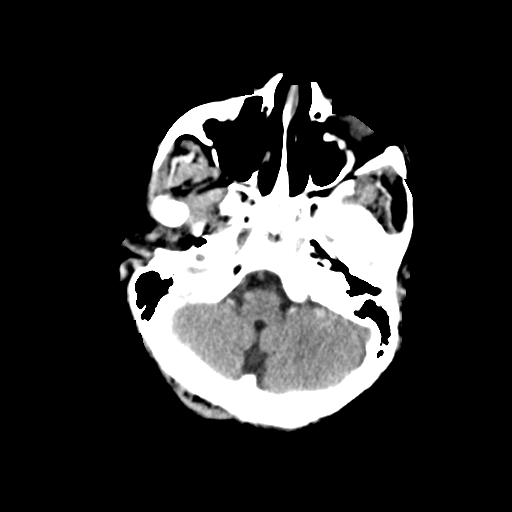
[im 10/34  brain]
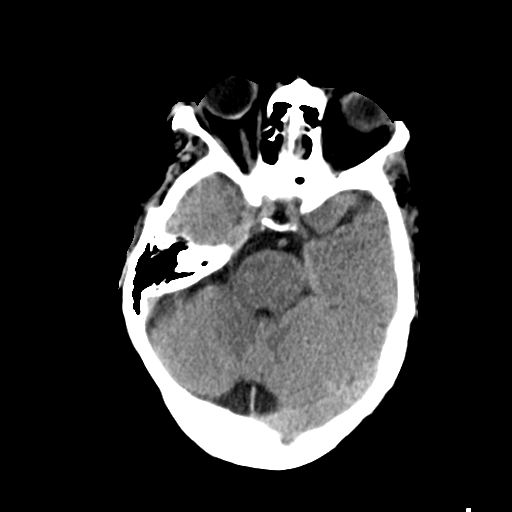
[im 12/34  brain]
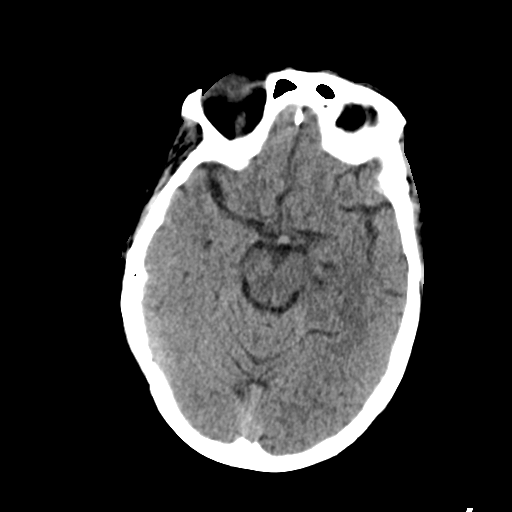
[im 15/34  brain]
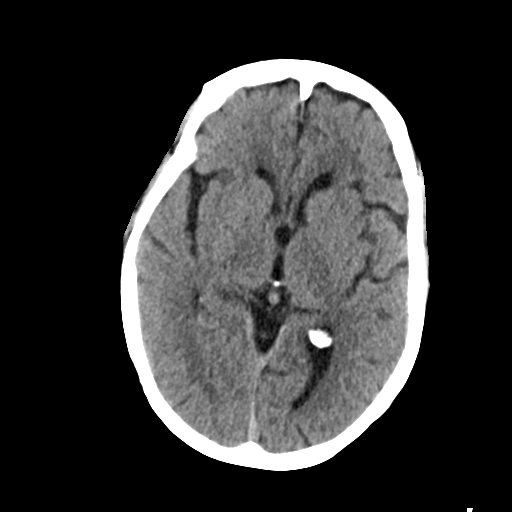
[im 15/34  bone]
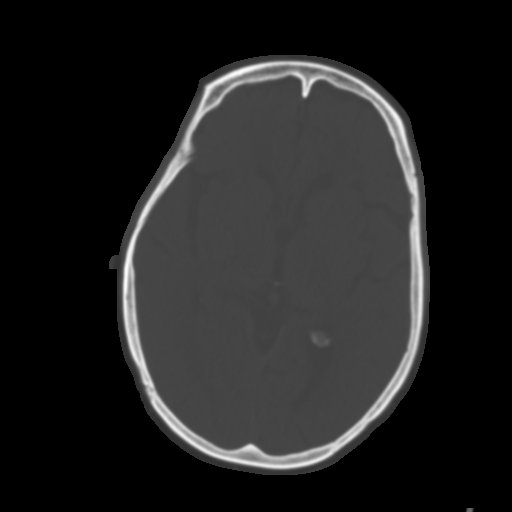
[im 19/34  brain]
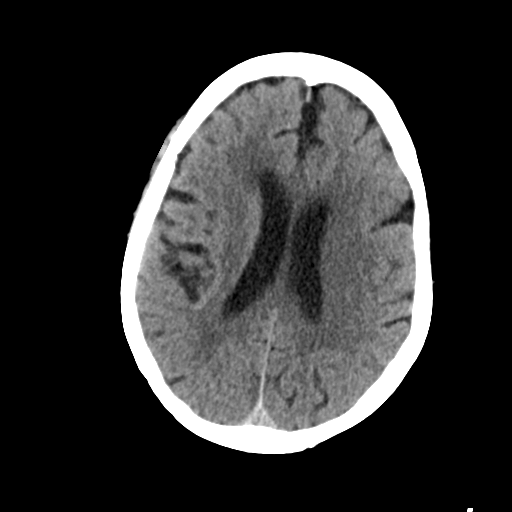
[im 22/34  brain]
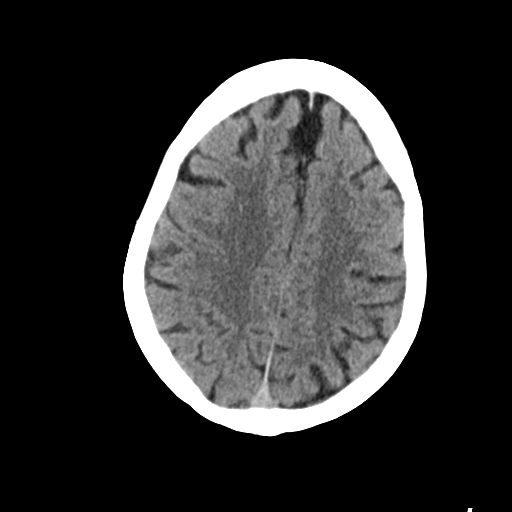
[im 26/34  brain]
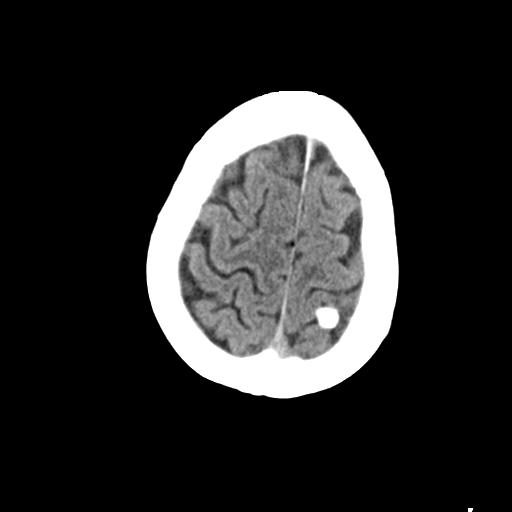
[im 28/34  brain]
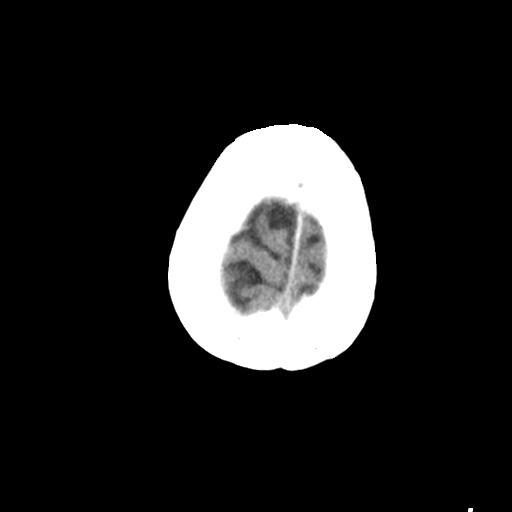
[im 28/34  bone]
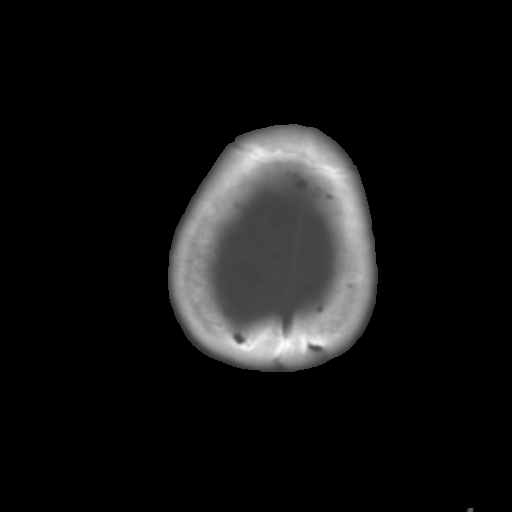
[im 31/34  brain]
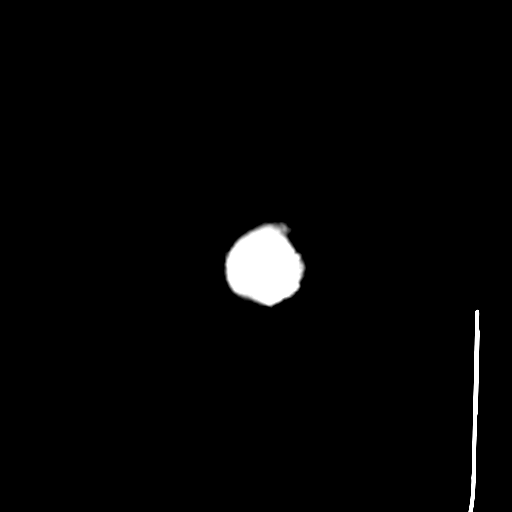

[Series 6: head 3.0 mpr cor · coronal · 0.32mm/px · 3 of 70 slices shown]
[im 24/70  brain]
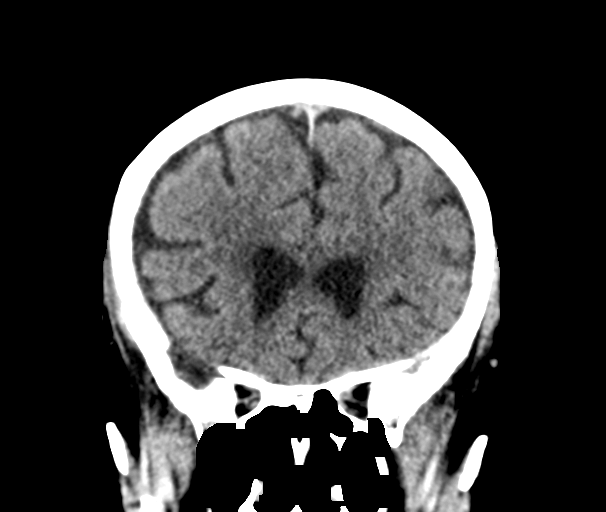
[im 31/70  brain]
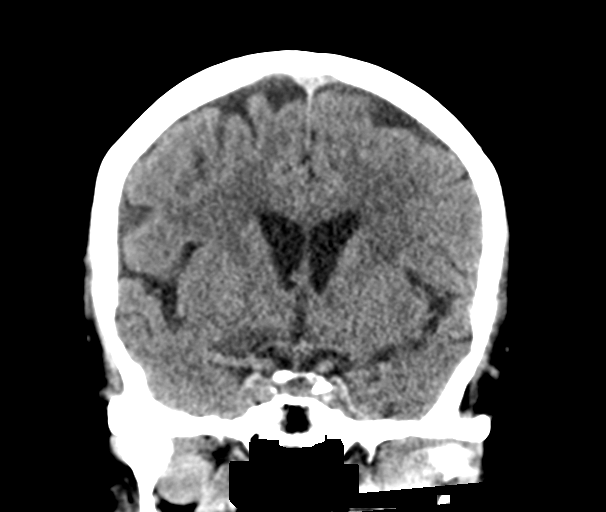
[im 39/70  brain]
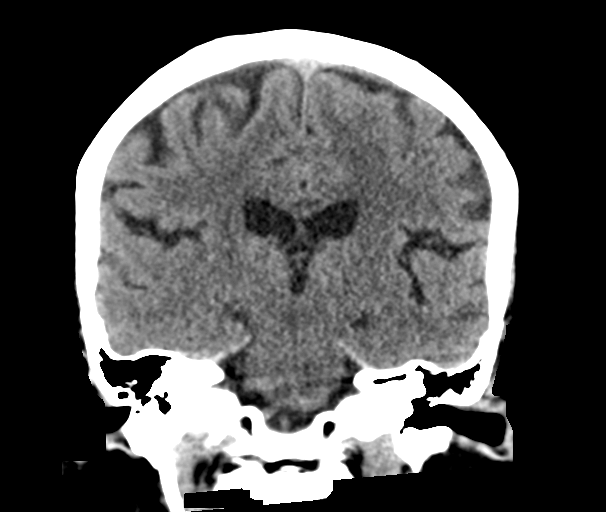

[Series 7: head 3.0 mpr sag · sagittal · 0.37mm/px · 3 of 66 slices shown]
[im 22/66  brain]
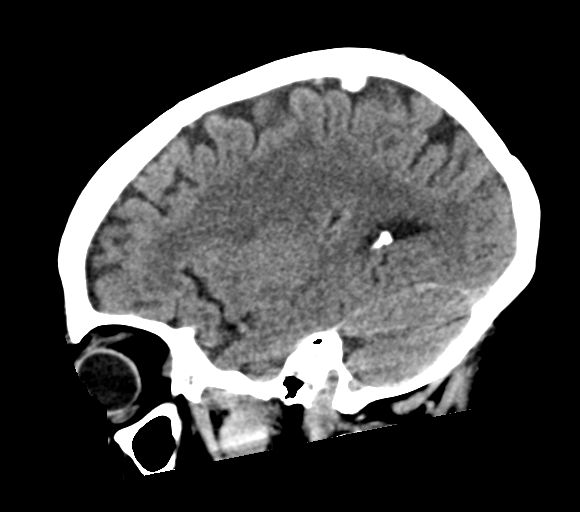
[im 33/66  brain]
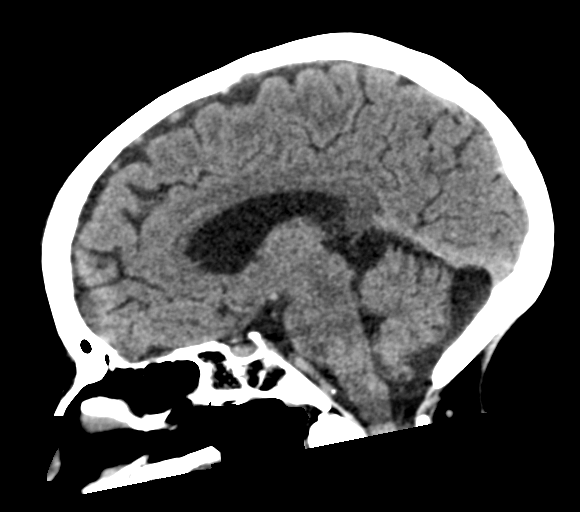
[im 44/66  brain]
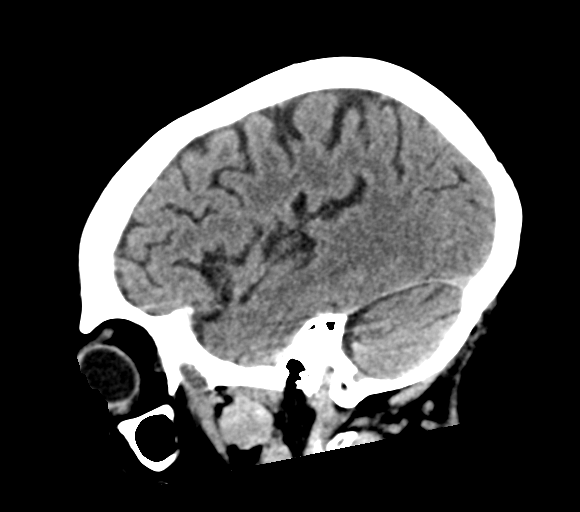

[16 of 47 positions shown; findings below may reference images not displayed]

FINDINGS: Brain: There is some chronic microvascular ischemic change.
Calcified meningioma over the high left parietal convexities
measuring 1.2 cm in diameter is unchanged. No evidence of acute
abnormality including hemorrhage, infarct, midline shift or abnormal
extra-axial fluid collection. No hydrocephalus or pneumocephalus.

Vascular: Atherosclerosis noted.

Skull: Intact.

Sinuses/Orbits: Negative.

Other: None.
IMPRESSION: No acute abnormality or change compared the prior examination.

Small calcified meningioma of the high left parietal convexities,
unchanged.

## 2017-11-22 ENCOUNTER — Ambulatory Visit: Payer: Medicare Other

## 2017-12-07 DIAGNOSIS — I10 Essential (primary) hypertension: Secondary | ICD-10-CM | POA: Diagnosis not present

## 2017-12-07 DIAGNOSIS — K219 Gastro-esophageal reflux disease without esophagitis: Secondary | ICD-10-CM | POA: Diagnosis not present

## 2017-12-07 DIAGNOSIS — N183 Chronic kidney disease, stage 3 (moderate): Secondary | ICD-10-CM | POA: Diagnosis not present

## 2017-12-07 DIAGNOSIS — E7800 Pure hypercholesterolemia, unspecified: Secondary | ICD-10-CM | POA: Diagnosis not present

## 2017-12-27 ENCOUNTER — Ambulatory Visit
Admission: RE | Admit: 2017-12-27 | Discharge: 2017-12-27 | Disposition: A | Discharge status: Home / Self Care | Payer: Medicare Other | Source: Ambulatory Visit | Attending: Internal Medicine | Admitting: Internal Medicine

## 2017-12-27 DIAGNOSIS — Z1231 Encounter for screening mammogram for malignant neoplasm of breast: Secondary | ICD-10-CM | POA: Diagnosis not present

## 2018-01-03 DIAGNOSIS — Z961 Presence of intraocular lens: Secondary | ICD-10-CM | POA: Diagnosis not present

## 2018-02-21 DIAGNOSIS — M8588 Other specified disorders of bone density and structure, other site: Secondary | ICD-10-CM | POA: Diagnosis not present

## 2018-05-01 DIAGNOSIS — R159 Full incontinence of feces: Secondary | ICD-10-CM | POA: Diagnosis not present

## 2018-05-01 DIAGNOSIS — K219 Gastro-esophageal reflux disease without esophagitis: Secondary | ICD-10-CM | POA: Diagnosis not present

## 2018-06-13 DIAGNOSIS — I10 Essential (primary) hypertension: Secondary | ICD-10-CM | POA: Diagnosis not present

## 2018-06-13 DIAGNOSIS — E7800 Pure hypercholesterolemia, unspecified: Secondary | ICD-10-CM | POA: Diagnosis not present

## 2018-06-13 DIAGNOSIS — K219 Gastro-esophageal reflux disease without esophagitis: Secondary | ICD-10-CM | POA: Diagnosis not present

## 2018-06-13 DIAGNOSIS — N183 Chronic kidney disease, stage 3 (moderate): Secondary | ICD-10-CM | POA: Diagnosis not present

## 2018-09-06 DIAGNOSIS — J3489 Other specified disorders of nose and nasal sinuses: Secondary | ICD-10-CM | POA: Diagnosis not present

## 2018-09-06 DIAGNOSIS — J309 Allergic rhinitis, unspecified: Secondary | ICD-10-CM | POA: Diagnosis not present

## 2018-10-24 DIAGNOSIS — J309 Allergic rhinitis, unspecified: Secondary | ICD-10-CM | POA: Diagnosis not present

## 2018-10-24 DIAGNOSIS — R51 Headache: Secondary | ICD-10-CM | POA: Diagnosis not present

## 2018-11-09 DIAGNOSIS — H1013 Acute atopic conjunctivitis, bilateral: Secondary | ICD-10-CM | POA: Diagnosis not present

## 2018-11-16 DIAGNOSIS — H1013 Acute atopic conjunctivitis, bilateral: Secondary | ICD-10-CM | POA: Diagnosis not present

## 2018-11-30 ENCOUNTER — Other Ambulatory Visit: Payer: Self-pay | Admitting: Internal Medicine

## 2018-11-30 DIAGNOSIS — Z1231 Encounter for screening mammogram for malignant neoplasm of breast: Secondary | ICD-10-CM

## 2018-12-12 ENCOUNTER — Other Ambulatory Visit: Payer: Self-pay | Admitting: Internal Medicine

## 2018-12-12 DIAGNOSIS — R109 Unspecified abdominal pain: Secondary | ICD-10-CM

## 2018-12-12 DIAGNOSIS — I10 Essential (primary) hypertension: Secondary | ICD-10-CM | POA: Diagnosis not present

## 2018-12-12 DIAGNOSIS — K219 Gastro-esophageal reflux disease without esophagitis: Secondary | ICD-10-CM | POA: Diagnosis not present

## 2018-12-12 DIAGNOSIS — E782 Mixed hyperlipidemia: Secondary | ICD-10-CM | POA: Diagnosis not present

## 2018-12-15 ENCOUNTER — Other Ambulatory Visit: Payer: Medicare Other

## 2018-12-20 ENCOUNTER — Other Ambulatory Visit: Payer: Medicare Other

## 2018-12-21 ENCOUNTER — Ambulatory Visit
Admission: RE | Admit: 2018-12-21 | Discharge: 2018-12-21 | Disposition: A | Discharge status: Home / Self Care | Payer: Medicare Other | Source: Ambulatory Visit | Attending: Internal Medicine | Admitting: Internal Medicine

## 2018-12-21 DIAGNOSIS — R109 Unspecified abdominal pain: Secondary | ICD-10-CM

## 2018-12-21 DIAGNOSIS — K5730 Diverticulosis of large intestine without perforation or abscess without bleeding: Secondary | ICD-10-CM | POA: Diagnosis not present

## 2018-12-21 MED ORDER — IOPAMIDOL (ISOVUE-300) INJECTION 61%
80.0000 mL | Freq: Once | INTRAVENOUS | Status: AC | PRN
  Administered 2018-12-21: 80 mL via INTRAVENOUS

## 2019-01-17 ENCOUNTER — Ambulatory Visit: Payer: Medicare Other

## 2019-01-23 DIAGNOSIS — R197 Diarrhea, unspecified: Secondary | ICD-10-CM | POA: Diagnosis not present

## 2019-04-03 ENCOUNTER — Ambulatory Visit: Payer: Medicare Other

## 2019-04-09 ENCOUNTER — Ambulatory Visit
Admission: RE | Admit: 2019-04-09 | Discharge: 2019-04-09 | Disposition: A | Discharge status: Home / Self Care | Payer: Medicare Other | Source: Ambulatory Visit | Attending: Internal Medicine | Admitting: Internal Medicine

## 2019-04-09 ENCOUNTER — Other Ambulatory Visit: Payer: Self-pay

## 2019-04-09 DIAGNOSIS — Z1231 Encounter for screening mammogram for malignant neoplasm of breast: Secondary | ICD-10-CM

## 2019-06-06 DIAGNOSIS — K279 Peptic ulcer, site unspecified, unspecified as acute or chronic, without hemorrhage or perforation: Secondary | ICD-10-CM | POA: Diagnosis not present

## 2019-06-20 DIAGNOSIS — E782 Mixed hyperlipidemia: Secondary | ICD-10-CM | POA: Diagnosis not present

## 2019-06-20 DIAGNOSIS — K279 Peptic ulcer, site unspecified, unspecified as acute or chronic, without hemorrhage or perforation: Secondary | ICD-10-CM | POA: Diagnosis not present

## 2019-06-20 DIAGNOSIS — Z0000 Encounter for general adult medical examination without abnormal findings: Secondary | ICD-10-CM | POA: Diagnosis not present

## 2019-06-20 DIAGNOSIS — I10 Essential (primary) hypertension: Secondary | ICD-10-CM | POA: Diagnosis not present

## 2019-06-20 DIAGNOSIS — Z1389 Encounter for screening for other disorder: Secondary | ICD-10-CM | POA: Diagnosis not present

## 2019-06-20 DIAGNOSIS — N1831 Chronic kidney disease, stage 3a: Secondary | ICD-10-CM | POA: Diagnosis not present

## 2019-07-26 DIAGNOSIS — F5104 Psychophysiologic insomnia: Secondary | ICD-10-CM | POA: Diagnosis not present

## 2019-09-10 DIAGNOSIS — H1013 Acute atopic conjunctivitis, bilateral: Secondary | ICD-10-CM | POA: Diagnosis not present

## 2019-11-08 DIAGNOSIS — G44209 Tension-type headache, unspecified, not intractable: Secondary | ICD-10-CM | POA: Diagnosis not present

## 2019-11-28 ENCOUNTER — Other Ambulatory Visit: Payer: Self-pay | Admitting: Gastroenterology

## 2019-11-28 DIAGNOSIS — R142 Eructation: Secondary | ICD-10-CM | POA: Diagnosis not present

## 2019-11-28 DIAGNOSIS — K219 Gastro-esophageal reflux disease without esophagitis: Secondary | ICD-10-CM

## 2019-12-10 ENCOUNTER — Ambulatory Visit
Admission: RE | Admit: 2019-12-10 | Discharge: 2019-12-10 | Disposition: A | Discharge status: Home / Self Care | Payer: Medicare Other | Source: Ambulatory Visit | Attending: Gastroenterology | Admitting: Gastroenterology

## 2019-12-10 DIAGNOSIS — K224 Dyskinesia of esophagus: Secondary | ICD-10-CM | POA: Diagnosis not present

## 2019-12-10 DIAGNOSIS — K219 Gastro-esophageal reflux disease without esophagitis: Secondary | ICD-10-CM | POA: Diagnosis not present

## 2019-12-17 DIAGNOSIS — R142 Eructation: Secondary | ICD-10-CM | POA: Diagnosis not present

## 2019-12-25 DIAGNOSIS — N183 Chronic kidney disease, stage 3 unspecified: Secondary | ICD-10-CM | POA: Diagnosis not present

## 2019-12-25 DIAGNOSIS — K219 Gastro-esophageal reflux disease without esophagitis: Secondary | ICD-10-CM | POA: Diagnosis not present

## 2019-12-25 DIAGNOSIS — I10 Essential (primary) hypertension: Secondary | ICD-10-CM | POA: Diagnosis not present

## 2019-12-25 DIAGNOSIS — I700 Atherosclerosis of aorta: Secondary | ICD-10-CM | POA: Diagnosis not present

## 2020-02-06 DIAGNOSIS — Z23 Encounter for immunization: Secondary | ICD-10-CM | POA: Diagnosis not present

## 2020-03-03 ENCOUNTER — Other Ambulatory Visit: Payer: Self-pay | Admitting: Internal Medicine

## 2020-03-03 DIAGNOSIS — Z1231 Encounter for screening mammogram for malignant neoplasm of breast: Secondary | ICD-10-CM

## 2020-03-25 DIAGNOSIS — H35352 Cystoid macular degeneration, left eye: Secondary | ICD-10-CM | POA: Diagnosis not present

## 2020-04-10 ENCOUNTER — Ambulatory Visit
Admission: RE | Admit: 2020-04-10 | Discharge: 2020-04-10 | Disposition: A | Discharge status: Home / Self Care | Payer: Medicare Other | Source: Ambulatory Visit | Attending: Internal Medicine | Admitting: Internal Medicine

## 2020-04-10 ENCOUNTER — Other Ambulatory Visit: Payer: Self-pay

## 2020-04-10 DIAGNOSIS — Z1231 Encounter for screening mammogram for malignant neoplasm of breast: Secondary | ICD-10-CM

## 2020-04-23 DIAGNOSIS — H35353 Cystoid macular degeneration, bilateral: Secondary | ICD-10-CM | POA: Diagnosis not present

## 2020-04-28 ENCOUNTER — Other Ambulatory Visit: Payer: Self-pay | Admitting: Internal Medicine

## 2020-04-28 DIAGNOSIS — Z1231 Encounter for screening mammogram for malignant neoplasm of breast: Secondary | ICD-10-CM

## 2020-05-26 DIAGNOSIS — H35351 Cystoid macular degeneration, right eye: Secondary | ICD-10-CM | POA: Diagnosis not present

## 2020-06-03 DIAGNOSIS — R5383 Other fatigue: Secondary | ICD-10-CM | POA: Diagnosis not present

## 2020-06-24 DIAGNOSIS — Z0000 Encounter for general adult medical examination without abnormal findings: Secondary | ICD-10-CM | POA: Diagnosis not present

## 2020-06-24 DIAGNOSIS — N1831 Chronic kidney disease, stage 3a: Secondary | ICD-10-CM | POA: Diagnosis not present

## 2020-06-24 DIAGNOSIS — E782 Mixed hyperlipidemia: Secondary | ICD-10-CM | POA: Diagnosis not present

## 2020-06-24 DIAGNOSIS — I10 Essential (primary) hypertension: Secondary | ICD-10-CM | POA: Diagnosis not present

## 2020-06-24 DIAGNOSIS — I700 Atherosclerosis of aorta: Secondary | ICD-10-CM | POA: Diagnosis not present

## 2020-06-24 DIAGNOSIS — Z1389 Encounter for screening for other disorder: Secondary | ICD-10-CM | POA: Diagnosis not present

## 2020-06-26 ENCOUNTER — Other Ambulatory Visit: Payer: Self-pay | Admitting: Internal Medicine

## 2020-06-26 DIAGNOSIS — M8580 Other specified disorders of bone density and structure, unspecified site: Secondary | ICD-10-CM

## 2020-07-08 DIAGNOSIS — H35351 Cystoid macular degeneration, right eye: Secondary | ICD-10-CM | POA: Diagnosis not present

## 2020-07-22 DIAGNOSIS — H35351 Cystoid macular degeneration, right eye: Secondary | ICD-10-CM | POA: Diagnosis not present

## 2020-09-02 DIAGNOSIS — H35351 Cystoid macular degeneration, right eye: Secondary | ICD-10-CM | POA: Diagnosis not present

## 2020-10-13 DIAGNOSIS — H35351 Cystoid macular degeneration, right eye: Secondary | ICD-10-CM | POA: Diagnosis not present

## 2020-11-18 ENCOUNTER — Emergency Department (HOSPITAL_COMMUNITY): Payer: Medicare Other

## 2020-11-18 ENCOUNTER — Emergency Department (HOSPITAL_COMMUNITY)
Admission: EM | Admit: 2020-11-18 | Discharge: 2020-11-18 | Disposition: A | Discharge status: Home / Self Care | Payer: Medicare Other | Attending: Emergency Medicine | Admitting: Emergency Medicine

## 2020-11-18 DIAGNOSIS — Z20822 Contact with and (suspected) exposure to covid-19: Secondary | ICD-10-CM | POA: Insufficient documentation

## 2020-11-18 DIAGNOSIS — I10 Essential (primary) hypertension: Secondary | ICD-10-CM | POA: Insufficient documentation

## 2020-11-18 DIAGNOSIS — R42 Dizziness and giddiness: Secondary | ICD-10-CM | POA: Diagnosis not present

## 2020-11-18 DIAGNOSIS — Z79899 Other long term (current) drug therapy: Secondary | ICD-10-CM | POA: Insufficient documentation

## 2020-11-18 DIAGNOSIS — E876 Hypokalemia: Secondary | ICD-10-CM | POA: Insufficient documentation

## 2020-11-18 DIAGNOSIS — R531 Weakness: Secondary | ICD-10-CM

## 2020-11-18 LAB — URINALYSIS, ROUTINE W REFLEX MICROSCOPIC
Bacteria, UA: NONE SEEN
Bilirubin Urine: NEGATIVE
Glucose, UA: NEGATIVE mg/dL
Ketones, ur: NEGATIVE mg/dL
Nitrite: NEGATIVE
Protein, ur: NEGATIVE mg/dL
Specific Gravity, Urine: 1.015 (ref 1.005–1.030)
pH: 5 (ref 5.0–8.0)

## 2020-11-18 LAB — CBC WITH DIFFERENTIAL/PLATELET
Abs Immature Granulocytes: 0.05 10*3/uL (ref 0.00–0.07)
Basophils Absolute: 0 10*3/uL (ref 0.0–0.1)
Basophils Relative: 0 %
Eosinophils Absolute: 0 10*3/uL (ref 0.0–0.5)
Eosinophils Relative: 0 %
HCT: 41.6 % (ref 36.0–46.0)
Hemoglobin: 13.7 g/dL (ref 12.0–15.0)
Immature Granulocytes: 1 %
Lymphocytes Relative: 18 %
Lymphs Abs: 1.7 10*3/uL (ref 0.7–4.0)
MCH: 31.5 pg (ref 26.0–34.0)
MCHC: 32.9 g/dL (ref 30.0–36.0)
MCV: 95.6 fL (ref 80.0–100.0)
Monocytes Absolute: 0.9 10*3/uL (ref 0.1–1.0)
Monocytes Relative: 9 %
Neutro Abs: 7.1 10*3/uL (ref 1.7–7.7)
Neutrophils Relative %: 72 %
Platelets: 299 10*3/uL (ref 150–400)
RBC: 4.35 MIL/uL (ref 3.87–5.11)
RDW: 13.1 % (ref 11.5–15.5)
WBC: 9.8 10*3/uL (ref 4.0–10.5)
nRBC: 0 % (ref 0.0–0.2)

## 2020-11-18 LAB — COMPREHENSIVE METABOLIC PANEL
ALT: 22 U/L (ref 0–44)
AST: 22 U/L (ref 15–41)
Albumin: 4.1 g/dL (ref 3.5–5.0)
Alkaline Phosphatase: 44 U/L (ref 38–126)
Anion gap: 8 (ref 5–15)
BUN: 20 mg/dL (ref 8–23)
CO2: 26 mmol/L (ref 22–32)
Calcium: 9.5 mg/dL (ref 8.9–10.3)
Chloride: 102 mmol/L (ref 98–111)
Creatinine, Ser: 1.13 mg/dL — ABNORMAL HIGH (ref 0.44–1.00)
GFR, Estimated: 48 mL/min — ABNORMAL LOW (ref >60)
Glucose, Bld: 104 mg/dL — ABNORMAL HIGH (ref 70–99)
Potassium: 2.9 mmol/L — ABNORMAL LOW (ref 3.5–5.1)
Sodium: 136 mmol/L (ref 135–145)
Total Bilirubin: 0.8 mg/dL (ref 0.3–1.2)
Total Protein: 7.7 g/dL (ref 6.5–8.1)

## 2020-11-18 LAB — SARS CORONAVIRUS 2 (TAT 6-24 HRS): SARS Coronavirus 2: NEGATIVE

## 2020-11-18 LAB — TROPONIN I (HIGH SENSITIVITY)
Troponin I (High Sensitivity): 5 ng/L (ref <18)
Troponin I (High Sensitivity): 8 ng/L (ref <18)

## 2020-11-18 LAB — LIPASE, BLOOD: Lipase: 29 U/L (ref 11–51)

## 2020-11-18 MED ORDER — POTASSIUM CHLORIDE CRYS ER 20 MEQ PO TBCR: 20.0000 meq | EXTENDED_RELEASE_TABLET | Freq: Two times a day (BID) | ORAL | 0 refills | Status: DC

## 2020-11-18 MED ORDER — POTASSIUM CHLORIDE CRYS ER 20 MEQ PO TBCR
40.0000 meq | EXTENDED_RELEASE_TABLET | Freq: Once | ORAL | Status: AC
  Administered 2020-11-18: 40 meq via ORAL
  Filled 2020-11-18: qty 2

## 2020-11-18 NOTE — Discharge Instructions (Addendum)
1.  Your potassium was slightly low.  Take potassium as prescribed twice a day for the next 5 to 7 days.  Try to schedule recheck with your doctor within a week to get a recheck on your potassium level. 2.  At this time, I suspect that you had a viral illness based on your symptoms.  Your COVID test is not resulted yet.  You can find the results in your "MyChart" which is accessible with a past work included in your discharge instructions.  Your doctor can also follow-up on the results and you may be called from the hospital with result.  Try to stay isolated and wear your mask until you have your results. 3.  Return to the emergency department if you get chest pain, shortness of breath, other unusual pains or return of any significant weakness or generally not well.

## 2020-11-18 NOTE — ED Provider Notes (Signed)
Emergency Medicine Provider Triage Evaluation Note  Valerie Cooper , a 87 y.o. female  was evaluated in triage.  Pt complains of feeling unwell for the last several days.  Reports around midday Saturday after eating lunch she felt "sick."  She describes this as feeling generally weak and tired but denies pain or shortness of breath at that time.  Reports she felt that way all day Sunday but awoke on Monday morning feeling well.  Reports she felt her normal self throughout the day on Monday and around 7 PM Monday evening began to feel unwell again.  Reports she went to bed and awoke around midnight feeling lightheaded.  This worried her therefore she came to the emergency department to be evaluated.  Patient denies chest pain, shortness of breath, abdominal pain, nausea, vomiting, diarrhea, fevers, chills, headache, neck pain, neck stiffness, cough, congestion.  Denies known sick contacts.  Is vaccinated and boosted for COVID.  No specific aggravating or alleviating factors..  Review of Systems  Positive: Fatigue, feeling unwell Negative: Fever, chills, nausea, vomiting, diarrhea  Physical Exam  BP (!) 157/64 (BP Location: Left Arm)   Pulse (!) 106   Temp 98.8 F (37.1 C) (Oral)   Resp 16   SpO2 98%  Gen:   Awake, no distress   Resp:  Normal effort, clear and equal breath sounds MSK:   Moves extremities without difficulty  Other:  Abdomen soft and nontender; nonfocal neuro exam -patient ambulates without assistance and with steady gait.  Medical Decision Making  Medically screening exam initiated at 2:23 AM.  Appropriate orders placed.  SHAKEELAH BARRITT was informed that the remainder of the evaluation will be completed by another provider, this initial triage assessment does not replace that evaluation, and the importance of remaining in the ED until their evaluation is complete.  Patient presents with nonspecific complaints of feeling unwell.  Blood work and imaging initiated.   Rand Etchison,  Gwenlyn Perking 11/18/20 0225    Orpah Greek, MD 11/18/20 [phone]

## 2020-11-18 NOTE — ED Notes (Signed)
Pt ambulated to the bathroom with minimal assistance. Was able to provide a urine sample. Assisted pt back to bed and hooked back to monitor.
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
## 2020-11-18 NOTE — ED Triage Notes (Signed)
Pt c/o intermittent weakness, feeling unwell x approx 3 days. Pt states she went to bed around 1900, woke up around midnight feeling lightheaded. Pt wanted to "play it safe," came to ED for eval. Denies pain, no additional complaints.

## 2020-11-24 DIAGNOSIS — E876 Hypokalemia: Secondary | ICD-10-CM | POA: Diagnosis not present

## 2020-12-01 DIAGNOSIS — E876 Hypokalemia: Secondary | ICD-10-CM | POA: Diagnosis not present

## 2020-12-02 DIAGNOSIS — E876 Hypokalemia: Secondary | ICD-10-CM | POA: Diagnosis not present

## 2020-12-02 DIAGNOSIS — I10 Essential (primary) hypertension: Secondary | ICD-10-CM | POA: Diagnosis not present

## 2020-12-16 DIAGNOSIS — H35351 Cystoid macular degeneration, right eye: Secondary | ICD-10-CM | POA: Diagnosis not present

## 2020-12-25 DIAGNOSIS — I700 Atherosclerosis of aorta: Secondary | ICD-10-CM | POA: Diagnosis not present

## 2020-12-25 DIAGNOSIS — N183 Chronic kidney disease, stage 3 unspecified: Secondary | ICD-10-CM | POA: Diagnosis not present

## 2020-12-25 DIAGNOSIS — I10 Essential (primary) hypertension: Secondary | ICD-10-CM | POA: Diagnosis not present

## 2020-12-25 DIAGNOSIS — E782 Mixed hyperlipidemia: Secondary | ICD-10-CM | POA: Diagnosis not present

## 2021-01-27 DIAGNOSIS — K219 Gastro-esophageal reflux disease without esophagitis: Secondary | ICD-10-CM | POA: Diagnosis not present

## 2021-01-27 DIAGNOSIS — E7800 Pure hypercholesterolemia, unspecified: Secondary | ICD-10-CM | POA: Diagnosis not present

## 2021-01-27 DIAGNOSIS — I10 Essential (primary) hypertension: Secondary | ICD-10-CM | POA: Diagnosis not present

## 2021-01-27 DIAGNOSIS — E782 Mixed hyperlipidemia: Secondary | ICD-10-CM | POA: Diagnosis not present

## 2021-03-17 DIAGNOSIS — H35351 Cystoid macular degeneration, right eye: Secondary | ICD-10-CM | POA: Diagnosis not present

## 2021-03-30 DIAGNOSIS — Z03818 Encounter for observation for suspected exposure to other biological agents ruled out: Secondary | ICD-10-CM | POA: Diagnosis not present

## 2021-03-30 DIAGNOSIS — R058 Other specified cough: Secondary | ICD-10-CM | POA: Diagnosis not present

## 2021-03-30 DIAGNOSIS — J029 Acute pharyngitis, unspecified: Secondary | ICD-10-CM | POA: Diagnosis not present

## 2021-03-30 DIAGNOSIS — R0982 Postnasal drip: Secondary | ICD-10-CM | POA: Diagnosis not present

## 2021-03-30 DIAGNOSIS — B338 Other specified viral diseases: Secondary | ICD-10-CM | POA: Diagnosis not present

## 2021-04-15 ENCOUNTER — Ambulatory Visit
Admission: RE | Admit: 2021-04-15 | Discharge: 2021-04-15 | Disposition: A | Discharge status: Home / Self Care | Payer: Medicare Other | Source: Ambulatory Visit | Attending: Internal Medicine | Admitting: Internal Medicine

## 2021-04-15 DIAGNOSIS — Z1231 Encounter for screening mammogram for malignant neoplasm of breast: Secondary | ICD-10-CM

## 2021-04-15 DIAGNOSIS — M8580 Other specified disorders of bone density and structure, unspecified site: Secondary | ICD-10-CM

## 2021-04-15 DIAGNOSIS — Z780 Asymptomatic menopausal state: Secondary | ICD-10-CM | POA: Diagnosis not present

## 2021-04-15 DIAGNOSIS — M8589 Other specified disorders of bone density and structure, multiple sites: Secondary | ICD-10-CM | POA: Diagnosis not present

## 2021-04-17 ENCOUNTER — Other Ambulatory Visit: Payer: Self-pay | Admitting: Internal Medicine

## 2021-04-17 DIAGNOSIS — R928 Other abnormal and inconclusive findings on diagnostic imaging of breast: Secondary | ICD-10-CM

## 2021-04-19 DIAGNOSIS — C801 Malignant (primary) neoplasm, unspecified: Secondary | ICD-10-CM

## 2021-04-19 HISTORY — DX: Malignant (primary) neoplasm, unspecified: C80.1

## 2021-04-24 ENCOUNTER — Ambulatory Visit
Admission: RE | Admit: 2021-04-24 | Discharge: 2021-04-24 | Disposition: A | Discharge status: Home / Self Care | Payer: Medicare Other | Source: Ambulatory Visit | Attending: Internal Medicine | Admitting: Internal Medicine

## 2021-04-24 ENCOUNTER — Other Ambulatory Visit: Payer: Self-pay | Admitting: Internal Medicine

## 2021-04-24 DIAGNOSIS — R928 Other abnormal and inconclusive findings on diagnostic imaging of breast: Secondary | ICD-10-CM | POA: Diagnosis not present

## 2021-04-24 DIAGNOSIS — R921 Mammographic calcification found on diagnostic imaging of breast: Secondary | ICD-10-CM

## 2021-04-24 DIAGNOSIS — R922 Inconclusive mammogram: Secondary | ICD-10-CM | POA: Diagnosis not present

## 2021-04-29 ENCOUNTER — Ambulatory Visit
Admission: RE | Admit: 2021-04-29 | Discharge: 2021-04-29 | Disposition: A | Discharge status: Home / Self Care | Payer: Medicare Other | Source: Ambulatory Visit | Attending: Internal Medicine | Admitting: Internal Medicine

## 2021-04-29 DIAGNOSIS — R921 Mammographic calcification found on diagnostic imaging of breast: Secondary | ICD-10-CM | POA: Diagnosis not present

## 2021-04-29 DIAGNOSIS — D0511 Intraductal carcinoma in situ of right breast: Secondary | ICD-10-CM | POA: Diagnosis not present

## 2021-04-29 HISTORY — PX: BREAST BIOPSY: SHX20

## 2021-05-05 ENCOUNTER — Other Ambulatory Visit: Payer: Self-pay | Admitting: General Surgery

## 2021-05-05 DIAGNOSIS — C50211 Malignant neoplasm of upper-inner quadrant of right female breast: Secondary | ICD-10-CM

## 2021-05-06 ENCOUNTER — Encounter: Payer: Self-pay | Admitting: Adult Health

## 2021-05-06 DIAGNOSIS — Z170 Estrogen receptor positive status [ER+]: Secondary | ICD-10-CM | POA: Insufficient documentation

## 2021-05-07 ENCOUNTER — Telehealth: Payer: Self-pay | Admitting: Hematology and Oncology

## 2021-05-07 ENCOUNTER — Other Ambulatory Visit: Payer: Self-pay | Admitting: General Surgery

## 2021-05-07 ENCOUNTER — Encounter: Payer: Self-pay | Admitting: *Deleted

## 2021-05-07 DIAGNOSIS — C50211 Malignant neoplasm of upper-inner quadrant of right female breast: Secondary | ICD-10-CM

## 2021-05-07 DIAGNOSIS — D0510 Intraductal carcinoma in situ of unspecified breast: Secondary | ICD-10-CM

## 2021-05-07 NOTE — Telephone Encounter (Signed)
Scheduled appt. Called pt, no answer and vm was full. Will try to contact pt about appt again tomorrow.

## 2021-05-07 NOTE — Telephone Encounter (Signed)
I called pt and spoke to her about her app with Dr. Chryl Heck on 1/30. Pt is aware of appt date and time and is aware to arrive 15 mins prior to appt time.

## 2021-05-18 ENCOUNTER — Other Ambulatory Visit: Payer: Self-pay

## 2021-05-18 ENCOUNTER — Encounter: Payer: Self-pay | Admitting: Hematology and Oncology

## 2021-05-18 ENCOUNTER — Encounter: Payer: Self-pay | Admitting: *Deleted

## 2021-05-18 ENCOUNTER — Inpatient Hospital Stay: Payer: Medicare Other | Attending: Hematology and Oncology | Admitting: Hematology and Oncology

## 2021-05-18 DIAGNOSIS — Z170 Estrogen receptor positive status [ER+]: Secondary | ICD-10-CM | POA: Diagnosis not present

## 2021-05-18 DIAGNOSIS — D0511 Intraductal carcinoma in situ of right breast: Secondary | ICD-10-CM | POA: Diagnosis not present

## 2021-05-18 DIAGNOSIS — C50212 Malignant neoplasm of upper-inner quadrant of left female breast: Secondary | ICD-10-CM

## 2021-05-18 NOTE — Progress Notes (Signed)
Radiation Oncology         [phone] ________________________________  Initial Outpatient Consultation  Name: Valerie Cooper MRN: [id number]  Date: 05/19/2021  DOB: 1934/04/02  [id number], Denton Ar, MD  Stark Klein, MD   REFERRING PHYSICIAN: Stark Klein, MD  DIAGNOSIS: D05.11   ICD-10-CM   1. Malignant neoplasm of upper-inner quadrant of right breast in female, estrogen receptor positive ([place])  C50.212    Z17.0     2. Ductal carcinoma in situ (DCIS) of right breast  D05.11       Stage 0 (cTis (DCIS), cN0, cM0) Right Breast UIQ Intermediate grade ductal carcinoma in-situ, ER+ / PR+ / Her2 not assessed  CHIEF COMPLAINT: Here to discuss management of right breast DCIS  HISTORY OF PRESENT ILLNESS::Valerie Cooper is a 88 y.o. female who presented with right breast abnormality on the following imaging: bilateral screening mammogram on the date of 04/15/21.  No symptoms, if any, were reported at that time. Right breast diagnostic mammogram on 04/24/21 further revealed indeterminate calcifications in the upper inner quadrant of the right breast.     Right breast upper inner biopsy on the date of 04/29/21 showed intermediate grade ductal carcinoma in-situ with calcifications measuring 0.2 cm in the greatest linear extent.  ER status: 100% positive; PR status 100% positive, both with strong staining intensity, Her2 not assessed.  Subsequently, the patient was referred to Dr. Barry Dienes on 05/05/21 to discuss surgical options. Following discussion of the risks and benefits, the patient agreed to proceed with right lumpectomy. Procedure scheduled for 06/17/21.   The patient also met with Dr. Chryl Heck yesterday (05/18/21), who recommended antiestrogen therapy with tamoxifen/aromatase inhibitors (based on menopausal status) 5 years, or XRT. Following discussion, the patient was noted to favor pursuing antiestrogen treatment vs. XRT, though stated that she did not want to make a final decision until  she discusses XRT further.   Pertinent imaging this far includes:  --DXA for bone density on 04/15/21 revealed the patient as osteopenic/low bone mass according to [facility] criteria. (Left femoral T-score of -1.5).  She is w/ her cousin in person and her daughter is on speaker phone, today.  PREVIOUS RADIATION THERAPY: No  PAST MEDICAL HISTORY:  has a past medical history of Cataract, Complication of anesthesia, DJD (degenerative joint disease), Family history of anesthesia complication, H/O colonoscopy (2005), Hyperlipidemia, Hypertension, Osteoporosis, Shingles (2001), and Ulcer (1980s).    PAST SURGICAL HISTORY: Past Surgical History:  Procedure Laterality Date   ABDOMINAL HYSTERECTOMY  1975   CHOLECYSTECTOMY  05/29/2012   Dr Lucia Gaskins   CHOLECYSTECTOMY N/A 05/29/2012   Procedure: LAPAROSCOPIC CHOLECYSTECTOMY WITH INTRAOPERATIVE CHOLANGIOGRAM;  Surgeon: Shann Medal, MD;  Location: [place];  Service: General;  Laterality: N/A;   FRACTURE SURGERY     L arm   ROTATOR CUFF REPAIR  2005   4 surgeries    FAMILY HISTORY: family history includes Stroke in her father and mother.  SOCIAL HISTORY:  reports that she has never smoked. She has never used smokeless tobacco. She reports that she does not drink alcohol and does not use drugs.  ALLERGIES: Antivert [meclizine hcl] and Other  MEDICATIONS:  Current Outpatient Medications  Medication Sig Dispense Refill   acetaminophen (TYLENOL) 325 MG tablet Take 2 tablets (650 mg total) by mouth every 6 (six) hours. (Patient taking differently: Take 325 mg by mouth every 4 (four) hours as needed for moderate pain, fever or headache.)     amLODipine (NORVASC) 5 MG tablet Take 10  mg by mouth 2 (two) times daily.     atorvastatin (LIPITOR) 20 MG tablet Take 20 mg by mouth daily.     cholecalciferol (VITAMIN D3) 25 MCG (1000 UNIT) tablet Take 1,000 Units by mouth daily.     ketorolac (ACULAR) 0.5 % ophthalmic solution Place 1 drop into the right eye 3  (three) times daily.     omeprazole (PRILOSEC) 20 MG capsule Take 20 mg by mouth daily.   1   potassium chloride SA (KLOR-CON) 20 MEQ tablet Take 1 tablet (20 mEq total) by mouth 2 (two) times daily. 14 tablet 0   prednisoLONE acetate (PRED FORTE) 1 % ophthalmic suspension Place 1 drop into the right eye 3 (three) times daily.     valsartan (DIOVAN) 80 MG tablet Take 80 mg by mouth daily.     No current facility-administered medications for this encounter.    REVIEW OF SYSTEMS: As above in HPI.   PHYSICAL EXAM:  height is 5' (1.524 m) and weight is 148 lb 4 oz (67.2 kg). Her temporal temperature is 97.2 F (36.2 C) (abnormal). Her blood pressure is 125/59 (abnormal) and her pulse is 71. Her respiration is 18 and oxygen saturation is 99%.   General: Alert and oriented, in no acute distress Heart: Regular in rate and rhythm with no murmurs, rubs, or gallops. Chest: Clear to auscultation bilaterally, with no rhonchi, wheezes, or rales. Extremities: ankle edema b/l Neurologic: Cranial nerves II through XII are grossly intact. No obvious focalities. Speech is fluent. Coordination is intact. Psychiatric: Judgment and insight are intact. Affect is appropriate. Breasts: UIQ right breast notable for 2cm mass c/w post biopsy chances . No other palpable masses appreciated in the breasts or axillae bilaterally.    ECOG = 1  0 - Asymptomatic (Fully active, able to carry on all predisease activities without restriction)  1 - Symptomatic but completely ambulatory (Restricted in physically strenuous activity but ambulatory and able to carry out work of a light or sedentary nature. For example, light housework, office work)  2 - Symptomatic, <50% in bed during the day (Ambulatory and capable of all self care but unable to carry out any work activities. Up and about more than 50% of waking hours)  3 - Symptomatic, >50% in bed, but not bedbound (Capable of only limited self-care, confined to bed or chair  50% or more of waking hours)  4 - Bedbound (Completely disabled. Cannot carry on any self-care. Totally confined to bed or chair)  5 - Death   Eustace Pen MM, Creech RH, Tormey DC, et al. [phone]). "Toxicity and response criteria of the [facility]". [place] Oncol. 5 (6): 649-55   LABORATORY DATA:  Lab Results  Component Value Date   WBC 9.8 11/18/2020   HGB 13.7 11/18/2020   HCT 41.6 11/18/2020   MCV 95.6 11/18/2020   PLT 299 11/18/2020   CMP     Component Value Date/Time   NA 136 11/18/2020 0233   K 2.9 (L) 11/18/2020 0233   CL 102 11/18/2020 0233   CO2 26 11/18/2020 0233   GLUCOSE 104 (H) 11/18/2020 0233   BUN 20 11/18/2020 0233   CREATININE 1.13 (H) 11/18/2020 0233   CALCIUM 9.5 11/18/2020 0233   PROT 7.7 11/18/2020 0233   ALBUMIN 4.1 11/18/2020 0233   AST 22 11/18/2020 0233   ALT 22 11/18/2020 0233   ALKPHOS 44 11/18/2020 0233   BILITOT 0.8 11/18/2020 0233   GFRNONAA 48 (L) 11/18/2020 7342  GFRAA 53 (L) 12/02/2016 1115         RADIOGRAPHY: MM Digital Diagnostic Unilat R  Result Date: 04/24/2021 CLINICAL DATA:  Patient returns after screening study for evaluation of RIGHT breast calcifications. EXAM: DIGITAL DIAGNOSTIC UNILATERAL RIGHT MAMMOGRAM TECHNIQUE: Right digital diagnostic mammography was performed. Mammographic images were processed with CAD. COMPARISON:  Previous exam(s). ACR Breast Density Category b: There are scattered areas of fibroglandular density. FINDINGS: Magnified views are performed of calcifications in the RIGHT breast. These views demonstrate a group of punctate and pleomorphic calcifications in the UPPER INNER QUADRANT of the RIGHT breast, spanning 0.9 x 0.4 centimeters. There is no associated distortion or parenchymal density. IMPRESSION: Indeterminate calcifications in the UPPER INNER QUADRANT of the RIGHT breast warranting tissue diagnosis. RECOMMENDATION: Stereotactic biopsy of RIGHT breast calcifications. I have discussed  the findings and recommendations with the patient. If applicable, a reminder letter will be sent to the patient regarding the next appointment. BI-RADS CATEGORY  4: Suspicious. Electronically Signed   By: Nolon Nations M.D.   On: 04/24/2021 11:30  MM CLIP PLACEMENT RIGHT  Result Date: 04/29/2021 CLINICAL DATA:  Post procedure mammogram for clip placement. EXAM: 3D DIAGNOSTIC RIGHT MAMMOGRAM POST STEREOTACTIC BIOPSY COMPARISON:  Previous exam(s). FINDINGS: 3D Mammographic images were obtained following stereotactic guided biopsy of calcifications in the upper inner right breast. The biopsy marking clip is in expected position at the site of biopsy. IMPRESSION: Appropriate positioning of the X shaped biopsy marking clip at the site of biopsy in the upper inner right breast. Final Assessment: Post Procedure Mammograms for Marker Placement Electronically Signed   By: Audie Pinto M.D.   On: 04/29/2021 09:20  MM RT BREAST BX W LOC DEV 1ST LESION IMAGE BX SPEC STEREO GUIDE  Addendum Date: 04/30/2021   ADDENDUM REPORT: 04/30/2021 15:14 ADDENDUM: Pathology revealed INTERMEDIATE GRADE DUCTAL CARCINOMA IN SITU WITH CALCIFICATIONS of the RIGHT breast, upper inner, (x clip). This was found to be concordant by Dr. Audie Pinto. Pathology results were discussed with the patient by telephone. The patient reported doing well after the biopsy with tenderness at the site. Post biopsy instructions and care were reviewed and questions were answered. The patient was encouraged to call [facility] for any additional concerns. My direct phone number was provided. Surgical consultation has been arranged with Dr. Stark Klein at [facility] on January 2023. Pathology results reported by Terie Purser, RN on 04/30/2021. Electronically Signed   By: Audie Pinto M.D.   On: 04/30/2021 15:14   Result Date: 04/30/2021 CLINICAL DATA:  88 year old female presenting for biopsy of  right breast calcifications. EXAM: RIGHT BREAST STEREOTACTIC CORE NEEDLE BIOPSY COMPARISON:  Previous exams. FINDINGS: The patient and I discussed the procedure of stereotactic-guided biopsy including benefits and alternatives. We discussed the high likelihood of a successful procedure. We discussed the risks of the procedure including infection, bleeding, tissue injury, clip migration, and inadequate sampling. Informed written consent was given. The usual time out protocol was performed immediately prior to the procedure. Using sterile technique and 1% Lidocaine as local anesthetic, under stereotactic guidance, a 9 gauge vacuum assisted device was used to perform core needle biopsy of calcifications in the upper inner right breast using a superior approach. Specimen radiograph was performed showing at least 5 specimens with calcifications. Specimens with calcifications are identified for pathology. Lesion quadrant: Upper inner quadrant At the conclusion of the procedure, an X shaped tissue marker clip was deployed into the biopsy cavity. Follow-up  2-view mammogram was performed and dictated separately. There was a small post biopsy hematoma. The bleeding was completely stopped prior to the patient leaving the office. IMPRESSION: Stereotactic-guided biopsy of calcifications in the upper inner right breast. Small post biopsy hematoma. Electronically Signed: By: Audie Pinto M.D. On: 04/29/2021 09:22     IMPRESSION/PLAN: Right breast DCIS  For the patient's DCIS, we had a thorough discussion about her options for adjuvant therapy. One option would be antiestrogen therapy as discussed with medical oncology. She would take a pill for approximately 5 years. Another option would be radiotherapy to the breast for 1-3 weeks. The most aggressive option would be to pursue both modalities, but this is likely unnecessary. Her risk of local recurrence after surgery will be approximately 9% over the next decade if she  doesn't do anything after surgery. RT and antiestrogens each cut this risk in half.  We discussed the risks benefits and side effects of radiotherapy. She understands that the side effects would likely include some skin irritation and fatigue during the weeks of radiation. There is a risk of late effects which include but are not necessarily limited to cosmetic changes and rare lung toxicity. I would anticipate delivering approximately 1-3 weeks of radiotherapy (she is interested in the European 1 week regimen).  After a thorough discussion, she is highly interested in the 1 week of ultra hypofractionated radiation therapy, understanding this is generally appearing to be as safe and effective as 3-4 week regimens based on several years of follow-up in European trials. She realizes this approach is a bit less standard than longer regimens but certainly more convenient. We spoke about acute effects including skin irritation and fatigue as well as breast fibrosis long term, and much less common late effects including internal organ injury or irritation. We spoke about the latest technology that is used to minimize the risk of late effects for patients undergoing radiotherapy to the breast or chest wall. No guarantees of treatment were given. The patient is enthusiastic about proceeding with treatment. Consent form signed today. I look forward to participating in the patient's care as warranted.  She will see me in late March for further discussion as she makes a final decision.   On date of service, in total, I spent 65 minutes on this encounter. Patient was seen in person.   __________________________________________   Eppie Gibson, MD  This document serves as a record of services personally performed by Eppie Gibson, MD. It was created on her behalf by Roney Mans, a trained medical scribe. The creation of this record is based on the scribe's personal observations and the provider's statements to them.  This document has been checked and approved by the attending provider.

## 2021-05-18 NOTE — Progress Notes (Signed)
Location of Breast Cancer:  Malignant neoplasm of upper-inner quadrant of right breast in female, estrogen receptor positive  Histology per Pathology Report:  (Definitive pathology pending upcoming surgery)  04/29/2021 Diagnosis Breast, right, needle core biopsy, upper inner, X clip - DUCTAL CARCINOMA IN SITU WITH CALCIFICATIONS - SEE COMMENT Microscopic Comment Based on the biopsy, the ductal carcinoma in situ has a solid and papillary patterns, intermediate nuclear grade and measures 0.2 cm in greatest linear extent.  Receptor Status: ER(100%), PR (100%)  Did patient present with symptoms (if so, please note symptoms) or was this found on screening mammography?: Patient had screening detected right breast calcifications, diagnostic mammogram confirmed this and showed 9 mm of punctate and pleomorphic calcifications in the upper inner right breast  Past/Anticipated interventions by surgeon, if any 06/17/2021 --Dr. Stark Klein Scheduled for: RIGHT BREAST SEED LOCALIZED LUMPECTOMY  Past/Anticipated interventions by medical oncology, if any:  Under care of Dr. Arletha Pili Iruku 05/18/2021 Recommendation: Breast conserving surgery Followed by adjuvant radiation therapy or antiestrogen therapy with tamoxifen/aromatase inhibitors based on menopausal status 5 years.  Recommend Tamoxifen. She has her appointment with radiation oncology tomorrow to discuss about adjuvant radiation.   She tells me she most likely will proceed with adjuvant antiestrogen therapy but will make the decision after tomorrow.   She has a follow-up scheduled about 2 to 3 weeks after her surgery to follow-up on initiating antiestrogen therapy.  Lymphedema issues, if any:  Patient denies     Pain issues, if any:  Patient denies   SAFETY ISSUES: Prior radiation? No Pacemaker/ICD? No Possible current pregnancy? No--hysterectomy Is the patient on methotrexate? No  Current Complaints / other details:  Nothing else  of note

## 2021-05-18 NOTE — Progress Notes (Signed)
[facility] NOTE  Patient Care Team: Valerie Low, MD as PCP - General (Internal Medicine)  CHIEF COMPLAINTS/PURPOSE OF CONSULTATION:  Newly diagnosed breast cancer  HISTORY OF PRESENTING ILLNESS:  Valerie Cooper 88 y.o. female is here because of recent diagnosis of right   I reviewed her records extensively and collaborated the history with the patient.  SUMMARY OF ONCOLOGIC HISTORY: Oncology History  Malignant neoplasm of upper-inner quadrant of right breast in female, estrogen receptor positive ([place])  04/29/2021 Cancer Staging   Staging form: Breast, AJCC 8th Edition - Clinical stage from 04/29/2021: Stage 0 (cTis (DCIS), cN0, cM0, ER+, PR+) - Signed by Valerie Phlegm, NP on 05/06/2021 Stage prefix: Initial diagnosis    05/06/2021 Initial Diagnosis   Malignant neoplasm of upper-inner quadrant of right breast in female, estrogen receptor positive ([place])    This is a very pleasant 88 year old female patient who was referred to medical oncology from Dr. Barry Cooper for new diagnosis of DCIS of the right breast.  Patient had screening detected right breast calcifications, diagnostic mammogram confirmed this and showed 9 mm of punctate and pleomorphic calcifications in the upper inner right breast.  Biopsy confirmed intermediate grade DCIS She was seen by Dr. Barry Cooper and recommendation was to proceed with localized lumpectomy.   She is here to establish with medical oncology. Receptor showed ER 100% positive PR however percent positive strong staining intensity She is here for a follow up with Valerie Cooper, Valerie Cooper is here with [place] on the phone. No family history of cancer.  Patient is healthy for her age, is active at the church and denies any major medical co-morbidities. Rest of the pertinent 10 point ROS reviewed and negative.  MEDICAL HISTORY:  Past Medical History:  Diagnosis Date   Cataract    Complication of anesthesia    It takes awhile for me to  wake up "   DJD (degenerative joint disease)    Family history of anesthesia complication    H/O colonoscopy 2005   Hyperlipidemia    Hypertension    Osteoporosis    Shingles 2001   right leg   Ulcer 1980s   PUD ?due to ASA intake    SURGICAL HISTORY: Past Surgical History:  Procedure Laterality Date   ABDOMINAL HYSTERECTOMY  1975   CHOLECYSTECTOMY  05/29/2012   Dr Valerie Cooper   CHOLECYSTECTOMY N/A 05/29/2012   Procedure: LAPAROSCOPIC CHOLECYSTECTOMY WITH INTRAOPERATIVE CHOLANGIOGRAM;  Surgeon: Valerie Medal, MD;  Location: [facility];  Service: General;  Laterality: N/A;   FRACTURE SURGERY     L arm   ROTATOR CUFF REPAIR  2005   4 surgeries    SOCIAL HISTORY: Social History   Socioeconomic History   Marital status: Widowed    Spouse name: Not on file   Number of children: Not on file   Years of education: Not on file   Highest education level: Not on file  Occupational History   Not on file  Tobacco Use   Smoking status: Never   Smokeless tobacco: Never  Substance and Sexual Activity   Alcohol use: No   Drug use: No   Sexual activity: Not on file  Other Topics Concern   Not on file  Social History Narrative   Not on file   Social Determinants of Health   Financial Resource Strain: Not on file  Food Insecurity: Not on file  Transportation Needs: Not on file  Physical Activity: Not on file  Stress: Not on  file  Social Connections: Not on file  Intimate Partner Violence: Not on file    FAMILY HISTORY: Family History  Problem Relation Age of Onset   Stroke Mother    Stroke Father     ALLERGIES:  is allergic to other.  MEDICATIONS:  Current Outpatient Medications  Medication Sig Dispense Refill   acetaminophen (TYLENOL) 325 MG tablet Take 2 tablets (650 mg total) by mouth every 6 (six) hours. (Patient taking differently: Take 325 mg by mouth every 4 (four) hours as needed for moderate pain, fever or headache.)     amLODipine (NORVASC) 5 MG tablet Take 10 mg  by mouth 2 (two) times daily.     atorvastatin (LIPITOR) 20 MG tablet Take 20 mg by mouth daily.     cholecalciferol (VITAMIN D3) 25 MCG (1000 UNIT) tablet Take 1,000 Units by mouth daily.     hydrochlorothiazide (HYDRODIURIL) 12.5 MG tablet Take 1 tablet (12.5 mg total) by mouth daily. 30 tablet 0   ketorolac (ACULAR) 0.5 % ophthalmic solution Place 1 drop into the right eye 3 (three) times daily.     omeprazole (PRILOSEC) 20 MG capsule Take 20 mg by mouth daily.   1   potassium chloride SA (KLOR-CON) 20 MEQ tablet Take 1 tablet (20 mEq total) by mouth 2 (two) times daily. 14 tablet 0   prednisoLONE acetate (PRED FORTE) 1 % ophthalmic suspension Place 1 drop into the right eye 3 (three) times daily.     No current facility-administered medications for this visit.    REVIEW OF SYSTEMS:   Constitutional: Denies fevers, chills or abnormal night sweats Eyes: Denies blurriness of vision, double vision or watery eyes Ears, nose, mouth, throat, and face: Denies mucositis or sore throat Respiratory: Denies cough, dyspnea or wheezes Cardiovascular: Denies palpitation, chest discomfort or lower extremity swelling Gastrointestinal:  Denies nausea, heartburn or change in bowel habits Skin: Denies abnormal skin rashes Lymphatics: Denies new lymphadenopathy or easy bruising Neurological:Denies numbness, tingling or new weaknesses Behavioral/Psych: Mood is stable, no new changes  Breast: Denies any palpable lumps or discharge All other systems were reviewed with the patient and are negative.  PHYSICAL EXAMINATION: ECOG PERFORMANCE STATUS: 0 - Asymptomatic  Vitals:   05/18/21 1023  BP: (!) 154/63  Pulse: 82  Temp: (!) 97.3 F (36.3 C)  SpO2: 99%   Filed Weights   05/18/21 1023  Weight: 149 lb 4.8 oz (67.7 kg)    GENERAL:alert, no distress and comfortable SKIN: skin color, texture, turgor are normal, no rashes or significant lesions EYES: normal, conjunctiva are pink and non-injected,  sclera clear OROPHARYNX:no exudate, no erythema and lips, buccal mucosa, and tongue normal  NECK: supple, thyroid normal size, non-tender, without nodularity LYMPH:  no palpable lymphadenopathy in the cervical, axillary or inguinal LUNGS: clear to auscultation and percussion with normal breathing effort HEART: regular rate & rhythm and no murmurs and no lower extremity edema ABDOMEN:abdomen soft, non-tender and normal bowel sounds Musculoskeletal:no cyanosis of digits and no clubbing  PSYCH: alert & oriented x 3 with fluent speech NEURO: no focal motor/sensory deficits BREAST: No palpable nodules in breast. No palpable axillary or supraclavicular lymphadenopathy   LABORATORY DATA:  I have reviewed the data as listed Lab Results  Component Value Date   WBC 9.8 11/18/2020   HGB 13.7 11/18/2020   HCT 41.6 11/18/2020   MCV 95.6 11/18/2020   PLT 299 11/18/2020   Lab Results  Component Value Date   NA 136 11/18/2020   K  2.9 (L) 11/18/2020   CL 102 11/18/2020   CO2 26 11/18/2020    RADIOGRAPHIC STUDIES: I have personally reviewed the radiological reports and agreed with the findings in the report.  ASSESSMENT AND PLAN:  This is a very pleasant 88 year old female patient with newly diagnosed right breast DCIS, ER positive who is here for recommendations.  We have discussed the following details.  Malignant neoplasm of upper-inner quadrant of right breast in female, estrogen receptor positive [facility]) Pathology review: I discussed with the patient the difference between DCIS and invasive breast cancer. It is considered a precancerous lesion. DCIS is classified as a Stage 0 breast cancer. It is generally detected through mammograms as calcifications. We discussed the significance of grades and its impact on prognosis. We also discussed the importance of ER and PR receptors and their implications to adjuvant treatment options. Prognosis of DCIS dependence on grade and degree of comedo  necrosis. It is anticipated that if not treated, 20-30% of DCIS can develop into invasive breast cancer.  Recommendation: 1. Breast conserving surgery 2. Followed by adjuvant radiation therapy or antiestrogen therapy with tamoxifen/aromatase inhibitors based on menopausal status 5 years. Recommend Tamoxifen.  Tamoxifen counseling: We discussed the risks and benefits of tamoxifen. These include but not limited to insomnia, hot flashes, mood changes, vaginal dryness, and weight gain. Although rare, serious side effects including endometrial cancer, risk of blood clots were also discussed. We strongly believe that the benefits far outweigh the risks. Patient understands these risks and consented to starting treatment. Planned treatment duration is 5 years.  Benefit of tamoxifen would be improvement in bone density.  Aromatase inhibitors counseling: We have discussed the mechanism of action of aromatase inhibitors today.  We have discussed adverse effects including but not limited to menopausal symptoms, increased risk of osteoporosis and fractures, cardiovascular events, arthralgias and myalgias.  We do believe that the benefits far outweigh the risks.  Plan treatment duration of 5 years.  After thorough discussion about both options, given benefit on bone density, patient is planning to proceed with tamoxifen.  She has her appointment with radiation oncology tomorrow to discuss about adjuvant radiation.  She tells me she most likely will proceed with adjuvant antiestrogen therapy but will make the decision after tomorrow.  She has a follow-up scheduled about 2 to 3 weeks after her surgery to follow-up on initiating antiestrogen therapy.   All questions were answered. The patient knows to call the clinic with any problems, questions or concerns.    Benay Pike, MD 05/18/21

## 2021-05-18 NOTE — Assessment & Plan Note (Addendum)
Pathology review: I discussed with the patient the difference between DCIS and invasive breast cancer. It is considered a precancerous lesion. DCIS is classified as a Stage 0 breast cancer. It is generally detected through mammograms as calcifications. We discussed the significance of grades and its impact on prognosis. We also discussed the importance of ER and PR receptors and their implications to adjuvant treatment options. Prognosis of DCIS dependence on grade and degree of comedo necrosis. It is anticipated that if not treated, 20-30% of DCIS can develop into invasive breast cancer.  Recommendation: 1. Breast conserving surgery 2. Followed by adjuvant radiation therapy or antiestrogen therapy with tamoxifen/aromatase inhibitors based on menopausal status 5 years. Recommend Tamoxifen.  Tamoxifen counseling: We discussed the risks and benefits of tamoxifen. These include but not limited to insomnia, hot flashes, mood changes, vaginal dryness, and weight gain. Although rare, serious side effects including endometrial cancer, risk of blood clots were also discussed. We strongly believe that the benefits far outweigh the risks. Patient understands these risks and consented to starting treatment. Planned treatment duration is 5 years.  Benefit of tamoxifen would be improvement in bone density.  Aromatase inhibitors counseling: We have discussed the mechanism of action of aromatase inhibitors today.  We have discussed adverse effects including but not limited to menopausal symptoms, increased risk of osteoporosis and fractures, cardiovascular events, arthralgias and myalgias.  We do believe that the benefits far outweigh the risks.  Plan treatment duration of 5 years.  After thorough discussion about both options, given benefit on bone density, patient is planning to proceed with tamoxifen.  She has her appointment with radiation oncology tomorrow to discuss about adjuvant radiation.  She tells me she most  likely will proceed with adjuvant antiestrogen therapy but will make the decision after tomorrow.  She has a follow-up scheduled about 2 to 3 weeks after her surgery to follow-up on initiating antiestrogen therapy.

## 2021-05-19 ENCOUNTER — Ambulatory Visit
Admission: RE | Admit: 2021-05-19 | Discharge: 2021-05-19 | Disposition: A | Discharge status: Home / Self Care | Payer: Medicare Other | Source: Ambulatory Visit | Attending: Radiation Oncology | Admitting: Radiation Oncology

## 2021-05-19 ENCOUNTER — Encounter: Payer: Self-pay | Admitting: Radiation Oncology

## 2021-05-19 VITALS — BP 125/59 | HR 71 | Temp 97.2°F | Resp 18 | Ht 60.0 in | Wt 148.2 lb

## 2021-05-19 DIAGNOSIS — C50212 Malignant neoplasm of upper-inner quadrant of left female breast: Secondary | ICD-10-CM | POA: Diagnosis not present

## 2021-05-19 DIAGNOSIS — M8580 Other specified disorders of bone density and structure, unspecified site: Secondary | ICD-10-CM | POA: Insufficient documentation

## 2021-05-19 DIAGNOSIS — D0511 Intraductal carcinoma in situ of right breast: Secondary | ICD-10-CM | POA: Insufficient documentation

## 2021-05-19 DIAGNOSIS — M1990 Unspecified osteoarthritis, unspecified site: Secondary | ICD-10-CM | POA: Insufficient documentation

## 2021-05-19 DIAGNOSIS — Z170 Estrogen receptor positive status [ER+]: Secondary | ICD-10-CM | POA: Diagnosis not present

## 2021-05-19 DIAGNOSIS — E785 Hyperlipidemia, unspecified: Secondary | ICD-10-CM | POA: Insufficient documentation

## 2021-05-19 DIAGNOSIS — Z791 Long term (current) use of non-steroidal anti-inflammatories (NSAID): Secondary | ICD-10-CM | POA: Diagnosis not present

## 2021-05-19 DIAGNOSIS — Z79899 Other long term (current) drug therapy: Secondary | ICD-10-CM | POA: Diagnosis not present

## 2021-05-25 ENCOUNTER — Telehealth: Payer: Self-pay | Admitting: Hematology and Oncology

## 2021-05-25 NOTE — Telephone Encounter (Signed)
Rescheduled appointment per staff message. Patient aware.

## 2021-06-09 ENCOUNTER — Other Ambulatory Visit: Payer: Self-pay

## 2021-06-09 ENCOUNTER — Encounter (HOSPITAL_BASED_OUTPATIENT_CLINIC_OR_DEPARTMENT_OTHER): Payer: Self-pay | Admitting: General Surgery

## 2021-06-15 ENCOUNTER — Encounter (HOSPITAL_BASED_OUTPATIENT_CLINIC_OR_DEPARTMENT_OTHER)
Admission: RE | Admit: 2021-06-15 | Discharge: 2021-06-15 | Disposition: A | Discharge status: Home / Self Care | Payer: Medicare Other | Source: Ambulatory Visit | Attending: General Surgery | Admitting: General Surgery

## 2021-06-15 DIAGNOSIS — Z79899 Other long term (current) drug therapy: Secondary | ICD-10-CM | POA: Diagnosis not present

## 2021-06-15 DIAGNOSIS — N6011 Diffuse cystic mastopathy of right breast: Secondary | ICD-10-CM | POA: Diagnosis not present

## 2021-06-15 DIAGNOSIS — D0511 Intraductal carcinoma in situ of right breast: Secondary | ICD-10-CM | POA: Diagnosis present

## 2021-06-15 DIAGNOSIS — M810 Age-related osteoporosis without current pathological fracture: Secondary | ICD-10-CM | POA: Diagnosis not present

## 2021-06-15 DIAGNOSIS — Z01818 Encounter for other preprocedural examination: Secondary | ICD-10-CM | POA: Insufficient documentation

## 2021-06-15 DIAGNOSIS — I10 Essential (primary) hypertension: Secondary | ICD-10-CM | POA: Insufficient documentation

## 2021-06-15 DIAGNOSIS — C50411 Malignant neoplasm of upper-outer quadrant of right female breast: Secondary | ICD-10-CM | POA: Diagnosis present

## 2021-06-15 DIAGNOSIS — Z8711 Personal history of peptic ulcer disease: Secondary | ICD-10-CM | POA: Diagnosis not present

## 2021-06-15 DIAGNOSIS — M1990 Unspecified osteoarthritis, unspecified site: Secondary | ICD-10-CM | POA: Diagnosis not present

## 2021-06-15 DIAGNOSIS — E785 Hyperlipidemia, unspecified: Secondary | ICD-10-CM | POA: Diagnosis not present

## 2021-06-15 DIAGNOSIS — K219 Gastro-esophageal reflux disease without esophagitis: Secondary | ICD-10-CM | POA: Diagnosis not present

## 2021-06-15 DIAGNOSIS — Z170 Estrogen receptor positive status [ER+]: Secondary | ICD-10-CM | POA: Diagnosis not present

## 2021-06-15 LAB — BASIC METABOLIC PANEL
Anion gap: 11 (ref 5–15)
BUN: 22 mg/dL (ref 8–23)
CO2: 26 mmol/L (ref 22–32)
Calcium: 9.1 mg/dL (ref 8.9–10.3)
Chloride: 102 mmol/L (ref 98–111)
Creatinine, Ser: 1.34 mg/dL — ABNORMAL HIGH (ref 0.44–1.00)
GFR, Estimated: 39 mL/min — ABNORMAL LOW (ref >60)
Glucose, Bld: 85 mg/dL (ref 70–99)
Potassium: 3.5 mmol/L (ref 3.5–5.1)
Sodium: 139 mmol/L (ref 135–145)

## 2021-06-15 MED ORDER — ENSURE PRE-SURGERY PO LIQD
296.0000 mL | Freq: Once | ORAL | Status: DC
Start: 2021-06-16 — End: 2021-06-17

## 2021-06-15 NOTE — Progress Notes (Signed)

## 2021-06-16 ENCOUNTER — Ambulatory Visit
Admission: RE | Admit: 2021-06-16 | Discharge: 2021-06-16 | Disposition: A | Discharge status: Home / Self Care | Payer: Medicare Other | Source: Ambulatory Visit | Attending: General Surgery | Admitting: General Surgery

## 2021-06-16 ENCOUNTER — Other Ambulatory Visit: Payer: Self-pay

## 2021-06-16 DIAGNOSIS — C50211 Malignant neoplasm of upper-inner quadrant of right female breast: Secondary | ICD-10-CM

## 2021-06-16 DIAGNOSIS — R928 Other abnormal and inconclusive findings on diagnostic imaging of breast: Secondary | ICD-10-CM | POA: Diagnosis not present

## 2021-06-17 ENCOUNTER — Encounter (HOSPITAL_BASED_OUTPATIENT_CLINIC_OR_DEPARTMENT_OTHER): Payer: Self-pay | Admitting: General Surgery

## 2021-06-17 ENCOUNTER — Other Ambulatory Visit: Payer: Self-pay

## 2021-06-17 ENCOUNTER — Ambulatory Visit (HOSPITAL_BASED_OUTPATIENT_CLINIC_OR_DEPARTMENT_OTHER)
Admission: RE | Admit: 2021-06-17 | Discharge: 2021-06-17 | Disposition: A | Discharge status: Home / Self Care | Payer: Medicare Other | Attending: General Surgery | Admitting: General Surgery

## 2021-06-17 ENCOUNTER — Ambulatory Visit (HOSPITAL_BASED_OUTPATIENT_CLINIC_OR_DEPARTMENT_OTHER): Payer: Medicare Other | Admitting: Anesthesiology

## 2021-06-17 ENCOUNTER — Ambulatory Visit
Admission: RE | Admit: 2021-06-17 | Discharge: 2021-06-17 | Disposition: A | Discharge status: Home / Self Care | Payer: Medicare Other | Source: Ambulatory Visit | Attending: General Surgery | Admitting: General Surgery

## 2021-06-17 ENCOUNTER — Encounter (HOSPITAL_BASED_OUTPATIENT_CLINIC_OR_DEPARTMENT_OTHER)
Admission: RE | Disposition: A | Discharge status: Home / Self Care | Payer: Self-pay | Source: Home / Self Care | Attending: General Surgery

## 2021-06-17 DIAGNOSIS — E785 Hyperlipidemia, unspecified: Secondary | ICD-10-CM | POA: Insufficient documentation

## 2021-06-17 DIAGNOSIS — M1990 Unspecified osteoarthritis, unspecified site: Secondary | ICD-10-CM | POA: Insufficient documentation

## 2021-06-17 DIAGNOSIS — R928 Other abnormal and inconclusive findings on diagnostic imaging of breast: Secondary | ICD-10-CM | POA: Diagnosis not present

## 2021-06-17 DIAGNOSIS — Z8711 Personal history of peptic ulcer disease: Secondary | ICD-10-CM | POA: Insufficient documentation

## 2021-06-17 DIAGNOSIS — N62 Hypertrophy of breast: Secondary | ICD-10-CM | POA: Diagnosis not present

## 2021-06-17 DIAGNOSIS — N6011 Diffuse cystic mastopathy of right breast: Secondary | ICD-10-CM | POA: Insufficient documentation

## 2021-06-17 DIAGNOSIS — R921 Mammographic calcification found on diagnostic imaging of breast: Secondary | ICD-10-CM | POA: Diagnosis not present

## 2021-06-17 DIAGNOSIS — C50211 Malignant neoplasm of upper-inner quadrant of right female breast: Secondary | ICD-10-CM

## 2021-06-17 DIAGNOSIS — K219 Gastro-esophageal reflux disease without esophagitis: Secondary | ICD-10-CM | POA: Insufficient documentation

## 2021-06-17 DIAGNOSIS — C50911 Malignant neoplasm of unspecified site of right female breast: Secondary | ICD-10-CM | POA: Diagnosis not present

## 2021-06-17 DIAGNOSIS — I10 Essential (primary) hypertension: Secondary | ICD-10-CM | POA: Insufficient documentation

## 2021-06-17 DIAGNOSIS — K279 Peptic ulcer, site unspecified, unspecified as acute or chronic, without hemorrhage or perforation: Secondary | ICD-10-CM | POA: Diagnosis not present

## 2021-06-17 DIAGNOSIS — Z79899 Other long term (current) drug therapy: Secondary | ICD-10-CM | POA: Insufficient documentation

## 2021-06-17 DIAGNOSIS — Z170 Estrogen receptor positive status [ER+]: Secondary | ICD-10-CM | POA: Insufficient documentation

## 2021-06-17 DIAGNOSIS — D0511 Intraductal carcinoma in situ of right breast: Secondary | ICD-10-CM | POA: Insufficient documentation

## 2021-06-17 DIAGNOSIS — M810 Age-related osteoporosis without current pathological fracture: Secondary | ICD-10-CM | POA: Insufficient documentation

## 2021-06-17 HISTORY — PX: BREAST LUMPECTOMY: SHX2

## 2021-06-17 HISTORY — DX: Gastro-esophageal reflux disease without esophagitis: K21.9

## 2021-06-17 HISTORY — PX: BREAST LUMPECTOMY WITH RADIOACTIVE SEED LOCALIZATION: SHX6424

## 2021-06-17 SURGERY — BREAST LUMPECTOMY WITH RADIOACTIVE SEED LOCALIZATION
Anesthesia: General | Site: Breast | Laterality: Right

## 2021-06-17 MED ORDER — CEFAZOLIN SODIUM-DEXTROSE 2-4 GM/100ML-% IV SOLN
2.0000 g | INTRAVENOUS | Status: AC
  Administered 2021-06-17: 2 g via INTRAVENOUS

## 2021-06-17 MED ORDER — CHLORHEXIDINE GLUCONATE CLOTH 2 % EX PADS: 6.0000 | MEDICATED_PAD | Freq: Once | CUTANEOUS | Status: DC

## 2021-06-17 MED ORDER — OXYCODONE HCL 5 MG PO TABS: 2.5000 mg | ORAL_TABLET | Freq: Four times a day (QID) | ORAL | 0 refills | Status: DC | PRN

## 2021-06-17 MED ORDER — LIDOCAINE HCL (CARDIAC) PF 100 MG/5ML IV SOSY
PREFILLED_SYRINGE | INTRAVENOUS | Status: DC | PRN
  Administered 2021-06-17: 40 mg via INTRAVENOUS

## 2021-06-17 MED ORDER — LACTATED RINGERS IV SOLN: INTRAVENOUS | Status: DC

## 2021-06-17 MED ORDER — FENTANYL CITRATE (PF) 100 MCG/2ML IJ SOLN: 25.0000 ug | INTRAMUSCULAR | Status: DC | PRN

## 2021-06-17 MED ORDER — FENTANYL CITRATE (PF) 100 MCG/2ML IJ SOLN
INTRAMUSCULAR | Status: AC
  Filled 2021-06-17: qty 2

## 2021-06-17 MED ORDER — OXYCODONE HCL 5 MG PO TABS: 5.0000 mg | ORAL_TABLET | Freq: Once | ORAL | Status: DC | PRN

## 2021-06-17 MED ORDER — FENTANYL CITRATE (PF) 100 MCG/2ML IJ SOLN
INTRAMUSCULAR | Status: DC | PRN
  Administered 2021-06-17: 25 ug via INTRAVENOUS

## 2021-06-17 MED ORDER — DEXAMETHASONE SODIUM PHOSPHATE 4 MG/ML IJ SOLN
INTRAMUSCULAR | Status: DC | PRN
  Administered 2021-06-17: 4 mg via INTRAVENOUS

## 2021-06-17 MED ORDER — OXYCODONE HCL 5 MG/5ML PO SOLN: 5.0000 mg | Freq: Once | ORAL | Status: DC | PRN

## 2021-06-17 MED ORDER — LIDOCAINE-EPINEPHRINE (PF) 1 %-1:200000 IJ SOLN
INTRAMUSCULAR | Status: DC | PRN
  Administered 2021-06-17: 60 mL

## 2021-06-17 MED ORDER — ACETAMINOPHEN 500 MG PO TABS
1000.0000 mg | ORAL_TABLET | ORAL | Status: AC
  Administered 2021-06-17: 1000 mg via ORAL

## 2021-06-17 MED ORDER — 0.9 % SODIUM CHLORIDE (POUR BTL) OPTIME
TOPICAL | Status: DC | PRN
  Administered 2021-06-17: 1000 mL

## 2021-06-17 MED ORDER — ACETAMINOPHEN 500 MG PO TABS
ORAL_TABLET | ORAL | Status: AC
  Filled 2021-06-17: qty 2

## 2021-06-17 MED ORDER — PROPOFOL 10 MG/ML IV BOLUS
INTRAVENOUS | Status: DC | PRN
Start: 2021-06-17 — End: 2021-06-17
  Administered 2021-06-17: 100 mg via INTRAVENOUS

## 2021-06-17 MED ORDER — ONDANSETRON HCL 4 MG/2ML IJ SOLN
INTRAMUSCULAR | Status: DC | PRN
  Administered 2021-06-17: 4 mg via INTRAVENOUS

## 2021-06-17 MED ORDER — CEFAZOLIN SODIUM-DEXTROSE 2-4 GM/100ML-% IV SOLN
INTRAVENOUS | Status: AC
  Filled 2021-06-17: qty 100

## 2021-06-17 MED ORDER — EPHEDRINE SULFATE (PRESSORS) 50 MG/ML IJ SOLN
INTRAMUSCULAR | Status: DC | PRN
  Administered 2021-06-17: 10 mg via INTRAVENOUS

## 2021-06-17 SURGICAL SUPPLY — 54 items
ADH SKN CLS APL DERMABOND .7 (GAUZE/BANDAGES/DRESSINGS) ×1
APL PRP STRL LF DISP 70% ISPRP (MISCELLANEOUS) ×1
BINDER BREAST LRG (GAUZE/BANDAGES/DRESSINGS) IMPLANT
BINDER BREAST MEDIUM (GAUZE/BANDAGES/DRESSINGS) IMPLANT
BINDER BREAST XLRG (GAUZE/BANDAGES/DRESSINGS) IMPLANT
BINDER BREAST XXLRG (GAUZE/BANDAGES/DRESSINGS) ×1 IMPLANT
BLADE SURG 10 STRL SS (BLADE) ×3 IMPLANT
BLADE SURG 15 STRL LF DISP TIS (BLADE) IMPLANT
BLADE SURG 15 STRL SS (BLADE)
CANISTER SUC SOCK COL 7IN (MISCELLANEOUS) IMPLANT
CANISTER SUCT 1200ML W/VALVE (MISCELLANEOUS) ×1 IMPLANT
CHLORAPREP W/TINT 26 (MISCELLANEOUS) ×3 IMPLANT
CLIP TI LARGE 6 (CLIP) ×3 IMPLANT
CLIP TI MEDIUM 6 (CLIP) IMPLANT
COVER BACK TABLE 60X90IN (DRAPES) ×3 IMPLANT
COVER MAYO STAND STRL (DRAPES) ×3 IMPLANT
COVER PROBE W GEL 5X96 (DRAPES) ×3 IMPLANT
DERMABOND ADVANCED (GAUZE/BANDAGES/DRESSINGS) ×1
DERMABOND ADVANCED .7 DNX12 (GAUZE/BANDAGES/DRESSINGS) ×2 IMPLANT
DRAPE LAPAROSCOPIC ABDOMINAL (DRAPES) ×3 IMPLANT
DRAPE UTILITY XL STRL (DRAPES) ×3 IMPLANT
ELECT COATED BLADE 2.86 ST (ELECTRODE) ×3 IMPLANT
ELECT REM PT RETURN 9FT ADLT (ELECTROSURGICAL) ×2
ELECTRODE REM PT RTRN 9FT ADLT (ELECTROSURGICAL) ×2 IMPLANT
GAUZE SPONGE 4X4 12PLY STRL LF (GAUZE/BANDAGES/DRESSINGS) ×3 IMPLANT
GLOVE SURG ENC MOIS LTX SZ6 (GLOVE) ×3 IMPLANT
GLOVE SURG POLYISO LF SZ6.5 (GLOVE) ×1 IMPLANT
GLOVE SURG UNDER POLY LF SZ6.5 (GLOVE) ×3 IMPLANT
GLOVE SURG UNDER POLY LF SZ7 (GLOVE) ×2 IMPLANT
GOWN STRL REUS W/ TWL LRG LVL3 (GOWN DISPOSABLE) ×2 IMPLANT
GOWN STRL REUS W/TWL 2XL LVL3 (GOWN DISPOSABLE) ×4 IMPLANT
GOWN STRL REUS W/TWL LRG LVL3 (GOWN DISPOSABLE) ×2
KIT MARKER MARGIN INK (KITS) ×3 IMPLANT
LIGHT WAVEGUIDE WIDE FLAT (MISCELLANEOUS) ×1 IMPLANT
NDL HYPO 25X1 1.5 SAFETY (NEEDLE) ×2 IMPLANT
NEEDLE HYPO 25X1 1.5 SAFETY (NEEDLE) ×2 IMPLANT
NS IRRIG 1000ML POUR BTL (IV SOLUTION) ×3 IMPLANT
PACK BASIN DAY SURGERY FS (CUSTOM PROCEDURE TRAY) ×3 IMPLANT
PENCIL SMOKE EVACUATOR (MISCELLANEOUS) ×3 IMPLANT
SLEEVE SCD COMPRESS KNEE MED (STOCKING) ×3 IMPLANT
SPIKE FLUID TRANSFER (MISCELLANEOUS) IMPLANT
SPONGE T-LAP 18X18 ~~LOC~~+RFID (SPONGE) ×3 IMPLANT
STRIP CLOSURE SKIN 1/2X4 (GAUZE/BANDAGES/DRESSINGS) ×3 IMPLANT
SUT MNCRL AB 4-0 PS2 18 (SUTURE) ×3 IMPLANT
SUT SILK 2 0 SH (SUTURE) IMPLANT
SUT VIC AB 2-0 SH 27 (SUTURE) ×2
SUT VIC AB 2-0 SH 27XBRD (SUTURE) ×2 IMPLANT
SUT VIC AB 3-0 SH 27 (SUTURE) ×2
SUT VIC AB 3-0 SH 27X BRD (SUTURE) ×2 IMPLANT
SYR CONTROL 10ML LL (SYRINGE) ×3 IMPLANT
TOWEL GREEN STERILE FF (TOWEL DISPOSABLE) ×3 IMPLANT
TRAY FAXITRON CT DISP (TRAY / TRAY PROCEDURE) ×3 IMPLANT
TUBE CONNECTING 20X1/4 (TUBING) ×1 IMPLANT
YANKAUER SUCT BULB TIP NO VENT (SUCTIONS) ×1 IMPLANT

## 2021-06-17 NOTE — Anesthesia Postprocedure Evaluation (Signed)
Anesthesia Post Note ? ?Patient: Valerie Cooper ? ?Procedure(s) Performed: RIGHT BREAST SEED LOCALIZED LUMPECTOMY (Right: Breast) ? ?  ? ?Patient location during evaluation: PACU ?Anesthesia Type: General ?Level of consciousness: awake and alert and oriented ?Pain management: pain level controlled ?Vital Signs Assessment: post-procedure vital signs reviewed and stable ?Respiratory status: spontaneous breathing, nonlabored ventilation and respiratory function stable ?Cardiovascular status: blood pressure returned to baseline and stable ?Postop Assessment: no apparent nausea or vomiting ?Anesthetic complications: no ? ? ?No notable events documented. ? ?Last Vitals:  ?Vitals:  ? 06/17/21 1230 06/17/21 1259  ?BP: (!) 141/51 (!) 150/72  ?Pulse: 82 89  ?Resp: 12 18  ?Temp:  (!) 36.1 ?C  ?SpO2: 95% 95%  ?  ?Last Pain:  ?Vitals:  ? 06/17/21 1259  ?TempSrc: Oral  ?PainSc: 0-No pain  ? ? ?  ?  ?  ?  ?  ?  ? ?Iylah Dworkin A. ? ? ? ? ?

## 2021-06-17 NOTE — H&P (Signed)
?REFERRING PHYSICIAN: Dimas Aguas, MD ? ?PROVIDER: Georgianne Fick, MD ? ?Care Team: Patient Care Team: ?[name], MD as PCP - General (Internal Medicine)  ? ?MRN: [id number] ?DOB: 18-Mar-1934 ? ?Subjective  ? ?Chief Complaint: Breast Cancer ? ? ?History of Present Illness: ?Valerie Cooper is a 88 y.o. female who is seen today as an office consultation at the request of Dr. Lysle Rubens for evaluation of Breast Cancer ?Marland Kitchen  ?Pt has a new diagnosis of right breast cancer 04/2021. She presented with screening detected right breast calcifications. Diagnostic mammogram confirmed this and showed 9 mm of punctate and pleiomorphic calcifications in the upper inner right breast. Core needle biopsy was performed. This showed intermediate grade DCIS. Final report is not available, but receptors are reportedly positive at 100% for ER and PR.  ? ?She hasn't had cancer before this and she has no family cancer history. 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 ? ?Diagnostic mammogram:04/24/21 ?ACR Breast Density Category b: There are scattered areas of ?fibroglandular density. ?  ?FINDINGS: ?Magnified views are performed of calcifications in the RIGHT breast. ?These views demonstrate a group of punctate and pleomorphic ?calcifications in the UPPER INNER QUADRANT of the RIGHT breast, ?spanning 0.9 x 0.4 centimeters. There is no associated distortion or ?parenchymal density. ?  ?IMPRESSION: ?Indeterminate calcifications in the UPPER INNER QUADRANT of the ?RIGHT breast warranting tissue diagnosis. ?  ?RECOMMENDATION: ?Stereotactic biopsy of RIGHT breast calcifications. ?  ?I have discussed the findings and recommendations with the patient. ?If applicable, a reminder letter will be sent to the patient ?regarding the next appointment. ?  ?BI-RADS CATEGORY 4: Suspicious. ? ?Pathology core needle biopsy: 04/29/21 ?Breast, right, needle core biopsy, upper inner, X  clip ?- DUCTAL CARCINOMA IN SITU WITH CALCIFICATIONS ?Based on the biopsy, the ductal carcinoma in situ has a solid and papillary patterns, intermediate nuclear grade and ?measures 0.2 cm in greatest linear extent. ? ?Receptors:** pending** ? ?Review of Systems: ?A complete review of systems was obtained from the patient. I have reviewed this information and discussed as appropriate with the patient. See HPI as well for other ROS. ? ?Review of Systems  ?All other systems reviewed and are negative. ? ? ?Medical History: ?Past Medical History:  ?Diagnosis Date  ? GERD (gastroesophageal reflux disease)  ? ?Patient Active Problem List  ?Diagnosis  ? Breast cancer of upper-inner quadrant of right female breast (CMS-HCC)  ? ?History reviewed. No pertinent surgical history.  ? ?No Known Allergies ? ?Current Outpatient Medications on File Prior to Visit  ?Medication Sig Dispense Refill  ? amLODIPine (NORVASC) 5 MG tablet Take 1 tablet by mouth 2 (two) times daily  ? prednisoLONE acetate (PRED FORTE) 1 % ophthalmic suspension 1 drop into affected eye  ? valsartan (DIOVAN) 80 MG tablet 1 tablet  ? acetaminophen 650 mg/20.3 mL Susp as directed  ? atorvastatin (LIPITOR) 20 MG tablet Take 1 tablet by mouth once daily  ? meclizine (ANTIVERT) 25 mg tablet 1 tablet as needed  ? omeprazole (PRILOSEC) 20 MG DR capsule Take 1 capsule by mouth once daily  ? ?No current facility-administered medications on file prior to visit.  ? ?Family History  ?Problem Relation Age of Onset  ? High blood pressure (Hypertension) Mother  ? Skin cancer Mother  ? High blood pressure (Hypertension) Father  ? Skin cancer Father  ? High blood pressure (Hypertension) Sister  ? High blood  pressure (Hypertension) Brother  ? ? ?Social History  ? ?Tobacco Use  ?Smoking Status Never  ?Smokeless Tobacco Never  ? ? ?Social History  ? ?Socioeconomic History  ? Marital status: Widowed  ?Tobacco Use  ? Smoking status: Never  ? Smokeless tobacco: Never  ?Substance and  Sexual Activity  ? Alcohol use: Never  ? Drug use: Never  ? ?Objective:  ? ?Vitals:  ?05/05/21 1356  ?BP: (!) 158/82  ?Pulse: 109  ?Temp: 37 ?C (98.6 ?F)  ?SpO2: 98%  ?Weight: 70.3 kg (155 lb)  ?Height: 154.9 cm (5\' 1" )  ? ?Body mass index is 29.29 kg/m?. ? ?Gen: No acute distress. Well nourished and well groomed.  ?Neurological: Alert and oriented to person, place, and time. Coordination normal.  ?Head: Normocephalic and atraumatic.  ?Eyes: Conjunctivae are normal. Pupils are equal, round, and reactive to light. No scleral icterus.  ?Neck: Normal range of motion. Neck supple. No tracheal deviation or thyromegaly present.  ?Cardiovascular: Normal rate, regular rhythm, normal heart sounds and intact distal pulses. Exam reveals no gallop and no friction rub. No murmur heard. ?Breast: Bruising right upper inner breast with some mild tenderness at biopsy site. No palpable masses. No nipple retraction or nipple discharge. No LAD. No skin dimpling. Left breast also benign.  ?Respiratory: Effort normal. No respiratory distress. No chest wall tenderness. Breath sounds normal. No wheezes, rales or rhonchi.  ?GI: Soft. Bowel sounds are normal. The abdomen is soft and nontender. There is no rebound and no guarding.  ?Musculoskeletal: Normal range of motion. Extremities are nontender.  ?Lymphadenopathy: No cervical, preauricular, postauricular or axillary adenopathy is present ?Skin: Skin is warm and dry. No rash noted. No diaphoresis. No erythema. No pallor. No clubbing, cyanosis, or edema.  ?Psychiatric: Normal mood and affect. Behavior is normal. Judgment and thought content normal.  ? ?Labs ?n/a ? ?Assessment and Plan:  ? ?Breast cancer of upper-inner quadrant of right female breast (CMS-HCC) ?Will plan seed localized lumpectomy for this Stage 0, cTis right breast cancer. She will need referral to oncology and possibly radiation post op.  ? ?The plan was discussed with the patient's daughter as well who was present by  phone.  ? ?The surgical procedure was described to the patient. I discussed the incision type and location and that we would need radiology involved on with a seed marker.  ? ?The risks and benefits of the procedure were described to the patient and she wishes to proceed.  ? ?We discussed the risks bleeding, infection, damage to other structures, need for further procedures/surgeries. We discussed the risk of seroma. The patient was advised if the area in the breast in cancer, we may need to go back to surgery for additional tissue to obtain negative margins or for a lymph node biopsy. The patient was advised that these are the most common complications, but that others can occur as well. They were advised against taking aspirin or other anti-inflammatory agents/blood thinners the week before surgery.  ? ? ?Milus Height, MD FACS ?Surgical Oncology, General Surgery, Trauma and Critical Care ?[facility] ?[facility] ?

## 2021-06-17 NOTE — Anesthesia Preprocedure Evaluation (Addendum)
Anesthesia Evaluation  ?Patient identified by MRN, date of birth, ID band ?Patient awake ? ? ? ?Reviewed: ?Allergy & Precautions, NPO status , Patient's Chart, lab work & pertinent test results, reviewed documented beta blocker date and time  ? ?History of Anesthesia Complications ?(+) PROLONGED EMERGENCE, Family history of anesthesia reaction and history of anesthetic complications ? ?Airway ?Mallampati: II ? ?TM Distance: >3 FB ?Neck ROM: Full ? ? ? Dental ? ?(+) Edentulous Upper, Edentulous Lower ?  ?Pulmonary ?neg pulmonary ROS,  ?  ?Pulmonary exam normal ?breath sounds clear to auscultation ? ? ? ? ? ? Cardiovascular ?hypertension, Pt. on medications ?Normal cardiovascular exam ?Rhythm:Regular Rate:Normal ? ? ?  ?Neuro/Psych ?negative neurological ROS ? negative psych ROS  ? GI/Hepatic ?Neg liver ROS, PUD, GERD  Medicated and Controlled,  ?Endo/Other  ?Osteoporosis ?Hyperlipidemia ? Renal/GU ?Renal InsufficiencyRenal disease  ?negative genitourinary ?  ?Musculoskeletal ? ?(+) Arthritis , Osteoarthritis,   ? Abdominal ?  ?Peds ? Hematology ?negative hematology ROS ?(+)   ?Anesthesia Other Findings ? ? Reproductive/Obstetrics ? ?  ? ? ? ? ? ? ? ? ? ? ? ? ? ?  ?  ? ? ? ? ? ? ? ?Anesthesia Physical ?Anesthesia Plan ? ?ASA: 2 ? ?Anesthesia Plan: General  ? ?Post-op Pain Management: Minimal or no pain anticipated  ? ?Induction: Intravenous ? ?PONV Risk Score and Plan: 3 and Treatment may vary due to age or medical condition and Ondansetron ? ?Airway Management Planned: LMA ? ?Additional Equipment: None ? ?Intra-op Plan:  ? ?Post-operative Plan: Extubation in OR ? ?Informed Consent: I have reviewed the patients History and Physical, chart, labs and discussed the procedure including the risks, benefits and alternatives for the proposed anesthesia with the patient or authorized representative who has indicated his/her understanding and acceptance.  ? ? ? ?Dental advisory given ? ?Plan  Discussed with: CRNA and Anesthesiologist ? ?Anesthesia Plan Comments:   ? ? ? ? ? ? ?Anesthesia Quick Evaluation ? ?

## 2021-06-17 NOTE — Discharge Instructions (Addendum)
?Post Anesthesia Home Care Instructions ? ?Activity: ?Get plenty of rest for the remainder of the day. A responsible individual must stay with you for 24 hours following the procedure.  ?For the next 24 hours, DO NOT: ?-Drive a car ?-Paediatric nurse ?-Drink alcoholic beverages ?-Take any medication unless instructed by your physician ?-Make any legal decisions or sign important papers. ? ?Meals: ?Start with liquid foods such as gelatin or soup. Progress to regular foods as tolerated. Avoid greasy, spicy, heavy foods. If nausea and/or vomiting occur, drink only clear liquids until the nausea and/or vomiting subsides. Call your physician if vomiting continues. ? ?Special Instructions/Symptoms: ?Your throat may feel dry or sore from the anesthesia or the breathing tube placed in your throat during surgery. If this causes discomfort, gargle with warm salt water. The discomfort should disappear within 24 hours. ? ?If you had a scopolamine patch placed behind your ear for the management of post- operative nausea and/or vomiting: ? ?1. The medication in the patch is effective for 72 hours, after which it should be removed.  Wrap patch in a tissue and discard in the trash. Wash hands thoroughly with soap and water. ?2. You may remove the patch earlier than 72 hours if you experience unpleasant side effects which may include dry mouth, dizziness or visual disturbances. ?3. Avoid touching the patch. Wash your hands with soap and water after contact with the patch. ?    ?No additional tylenol before 1pm  ? ?[facility] ?Office Phone Number [phone] ? ?BREAST BIOPSY/ PARTIAL MASTECTOMY: POST OP INSTRUCTIONS ? ?Always review your discharge instruction sheet given to you by the facility where your surgery was performed. ? ?IF YOU HAVE DISABILITY OR FAMILY LEAVE FORMS, YOU MUST BRING THEM TO THE OFFICE FOR PROCESSING.  DO NOT GIVE THEM TO YOUR DOCTOR. ? ?Take 2 tylenol (acetominophen) three times a day for 3  days.  If this is not enough, add the narcotic pain pill.  If you find you are needing a lot of this overnight after surgery, call the next morning for a refill.   ? Prescriptions will not be filled after 5pm or on week-ends. ?Take your usually prescribed medications unless otherwise directed ?You should eat very light the first 24 hours after surgery, such as soup, crackers, pudding, etc.  Resume your normal diet the day after surgery. ?Most patients will experience some swelling and bruising in the breast.  Ice packs and a good support bra will help.  Swelling and bruising can take several days to resolve.  ?It is common to experience some constipation if taking pain medication after surgery.  Increasing fluid intake and taking a stool softener will usually help or prevent this problem from occurring.  A mild laxative (Milk of Magnesia or Miralax) should be taken according to package directions if there are no bowel movements after 48 hours. ?Unless discharge instructions indicate otherwise, you may remove your bandages 48 hours after surgery, and you may shower at that time.  You may have steri-strips (small skin tapes) in place directly over the incision.  These strips should be left on the skin at least for for 7-10 days.    ?ACTIVITIES:  You may resume regular daily activities (gradually increasing) beginning the next day.  Wearing a good support bra or sports bra (or the breast binder) minimizes pain and swelling.  You may have sexual intercourse when it is comfortable. ?No heavy lifting for 1-2 weeks (not over around 10 pounds).  ?You may  drive when you no longer are taking prescription pain medication, you can comfortably wear a seatbelt, and you can safely maneuver your car and apply brakes. ?RETURN TO WORK:  __________3-14 days depending on job. _______________ ?You should see your doctor in the office for a follow-up appointment approximately two weeks after your surgery.  Your doctor?s nurse will  typically make your follow-up appointment when she calls you with your pathology report.  Expect your pathology report 3-4 business days after your surgery.  You may call to check if you do not hear from [place] after three days. ? ? ?WHEN TO CALL YOUR DOCTOR: ?Fever over 101.0 ?Nausea and/or vomiting. ?Extreme swelling or bruising. ?Continued bleeding from incision. ?Increased pain, redness, or drainage from the incision. ? ?The clinic staff is available to answer your questions during regular business hours.  Please don?t hesitate to call and ask to speak to one of the nurses for clinical concerns.  If you have a medical emergency, go to the nearest emergency room or call 911.  A surgeon from [facility] is always on call at the hospital. ? ?For further questions, please visit [email or web address]  ? ?

## 2021-06-17 NOTE — Op Note (Signed)
Right Breast Radioactive seed localized lumpectomy  Indications: This patient presents with history of right breast cancer, upper outer quadrant, intermediate nuclear grade, cTis receptors +/+  Pre-operative Diagnosis: right breast cancer  Post-operative Diagnosis: Same  Surgeon: Stark Klein   Asst:  Carlena Hurl, PA-C  Anesthesia: General endotracheal anesthesia  ASA Class: 2  Procedure Details  The patient was seen in the Holding Room. The risks, benefits, complications, treatment options, and expected outcomes were discussed with the patient. The possibilities of bleeding, infection, the need for additional procedures, failure to diagnose a condition, and creating a complication requiring other procedures or operations were discussed with the patient. The patient concurred with the proposed plan, giving informed consent.  The site of surgery properly noted/marked. The patient was taken to Operating Room # 2, identified, and the procedure verified as right breast seed localized lumpectomy.  The right breast and chest were prepped and draped in standard fashion. A superior circumareolar incision was made near the previously placed radioactive seed.  Dissection was carried down around the point of maximum signal intensity. The cautery was used to perform the dissection.   The specimen was inked with the margin marker paint kit.    Specimen radiography confirmed inclusion of the mammographic lesion, the clip, and the seed.  The background signal in the breast was zero.  Hemostasis was achieved with cautery.  The cavity was marked with clips on each border other than the anterior border.  The wound was irrigated and closed with 3-0 vicryl interrupted deep dermal sutures and 4-0 monocryl running subcuticular suture.      Sterile dressings were applied. At the end of the operation, all sponge, instrument, and needle counts were correct.   Findings: Seed, clip in specimen.  Anterior margin is  skin.   Estimated Blood Loss:  min         Specimens: left breast tissue with seed         Complications:  None; patient tolerated the procedure well.         Disposition: PACU - hemodynamically stable.         Condition: stable

## 2021-06-17 NOTE — Anesthesia Procedure Notes (Signed)
Procedure Name: LMA Insertion ?Date/Time: 06/17/2021 11:01 AM ?Performed by: Willa Frater, CRNA ?Pre-anesthesia Checklist: Patient identified, Emergency Drugs available, Suction available and Patient being monitored ?Patient Re-evaluated:Patient Re-evaluated prior to induction ?Oxygen Delivery Method: Circle system utilized ?Preoxygenation: Pre-oxygenation with 100% oxygen ?Induction Type: IV induction ?Ventilation: Mask ventilation without difficulty ?LMA: LMA inserted ?LMA Size: 4.0 ?Number of attempts: 1 ?Airway Equipment and Method: Bite block ?Placement Confirmation: positive ETCO2 ?Tube secured with: Tape ?Dental Injury: Teeth and Oropharynx as per pre-operative assessment  ? ? ? ? ?

## 2021-06-17 NOTE — Interval H&P Note (Signed)
History and Physical Interval Note: ? ?06/17/2021 ?10:33 AM ? ?Valerie Cooper  has presented today for surgery, with the diagnosis of RIGHT BREAST CANCER.  The various methods of treatment have been discussed with the patient and family. After consideration of risks, benefits and other options for treatment, the patient has consented to  Procedure(s): ?RIGHT BREAST SEED LOCALIZED LUMPECTOMY (Right) as a surgical intervention.  The patient's history has been reviewed, patient examined, no change in status, stable for surgery.  I have reviewed the patient's chart and labs.  Questions were answered to the patient's satisfaction.   ? ? ?Stark Klein ? ? ?

## 2021-06-17 NOTE — Transfer of Care (Signed)
Immediate Anesthesia Transfer of Care Note ? ?Patient: Valerie Cooper ? ?Procedure(s) Performed: RIGHT BREAST SEED LOCALIZED LUMPECTOMY (Right: Breast) ? ?Patient Location: PACU ? ?Anesthesia Type:General ? ?Level of Consciousness: awake, alert , oriented, drowsy and patient cooperative ? ?Airway & Oxygen Therapy: Patient Spontanous Breathing and Patient connected to face mask oxygen ? ?Post-op Assessment: Report given to RN and Post -op Vital signs reviewed and stable ? ?Post vital signs: Reviewed and stable ? ?Last Vitals:  ?Vitals Value Taken Time  ?BP    ?Temp    ?Pulse 104 06/17/21 1213  ?Resp    ?SpO2 97 % 06/17/21 1213  ?Vitals shown include unvalidated device data. ? ?Last Pain:  ?Vitals:  ? 06/17/21 0840  ?TempSrc: Oral  ?PainSc: 0-No pain  ?   ? ?  ? ?Complications: No notable events documented. ?

## 2021-06-18 ENCOUNTER — Encounter (HOSPITAL_BASED_OUTPATIENT_CLINIC_OR_DEPARTMENT_OTHER): Payer: Self-pay | Admitting: General Surgery

## 2021-06-19 LAB — SURGICAL PATHOLOGY

## 2021-06-24 ENCOUNTER — Encounter: Payer: Self-pay | Admitting: *Deleted

## 2021-07-01 ENCOUNTER — Telehealth: Payer: Self-pay

## 2021-07-01 NOTE — Telephone Encounter (Signed)
Called patient to let her know the only CT simulation appointment times available after her appointment with Dr. Isidore Moos on 3/24 were 2pm or 3pm. Asked if she would like to move her FUN appointment to 1:30pm or 2:30pm to minimize gap in between appointments. She stated she would prefer the 2 pm simulation slot with 1:30pm FUN. Asked patient if she would like me to relay this change to her daughter since she will be coming up form [place], [place] to stay with patient while she's receiving treatment. Patient stated she had everything written down, and felt comfortable updating her daughter. Provided my direct call back number and let patient know that she or her daughter could contact me directly if they have any questions/concerns before patient's appointments on 3/24. Patient verbalized understanding and appreciation of call ?

## 2021-07-02 ENCOUNTER — Encounter (HOSPITAL_COMMUNITY): Payer: Self-pay

## 2021-07-09 NOTE — Progress Notes (Signed)
?Radiation Oncology         [phone] ?________________________________ ? ?Name: Valerie Cooper MRN: [id number]  ?Date: 07/10/2021  DOB: 1933-08-01 ? ?Follow-Up Visit Note ? ?Outpatient ? ?CC: [name], MD  [name], MD ? ?Diagnosis:    ?  ICD-10-CM   ?1. Malignant neoplasm of upper-inner quadrant of right breast in female, estrogen receptor positive ([place])  C50.212   ? Z17.0   ?  ?2. Ductal carcinoma in situ (DCIS) of right breast  D05.11   ?  ?  ? ?S/p lumpectomy: Stage 0 (cTis (DCIS), cN0, cM0) Right Breast UIQ, Intermediate grade ductal carcinoma in-situ, ER+ / PR+ / Her2 not assessed ? ?CHIEF COMPLAINT: Here to discuss management of right breast DCIS ? ?Narrative:  The patient returns today for follow-up.   ?  ?Since consultation date of 05/19/21, the patient opted to proceed with right breast lumpectomy on 06/17/21 under the care of Dr. Barry Dienes. Pathology from the procedure revealed: tumor size spanning approximately 16 mm; histology of intermediate grade ductal carcinoma in-situ with calcifications (negative for invasive carcinoma); all margins negative for DCIS; margin status to in situ disease of 2.5 mm from the posterior margin; no lymph nodes were examined;  ER status: 100% positive; PR status 100% positive (both with strong staining intensity), Her2 not assessed. ? ?No imaging studies have been performed since the patient was last seen.  ? ?Symptomatically, the patient reports: Doing well.  She has noted a little bit of glue remaining over the lumpectomy scar.  Since meeting with me at consultation she and her family decided that they would prefer to proceed with a 3-week rather than the 1 week course of breast radiation ?       ?ALLERGIES:  is allergic to antivert [meclizine hcl] and other. ? ?Meds: ?Current Outpatient Medications  ?Medication Sig Dispense Refill  ? acetaminophen (TYLENOL) 325 MG tablet Take 2 tablets (650 mg total) by mouth every 6 (six) hours. (Patient taking differently:  Take 325 mg by mouth every 4 (four) hours as needed for moderate pain, fever or headache.)    ? amLODipine (NORVASC) 5 MG tablet Take by mouth 2 (two) times daily.    ? atorvastatin (LIPITOR) 20 MG tablet Take 20 mg by mouth daily.    ? cholecalciferol (VITAMIN D3) 25 MCG (1000 UNIT) tablet Take 1,000 Units by mouth daily.    ? ketorolac (ACULAR) 0.5 % ophthalmic solution Place 1 drop into the right eye 3 (three) times daily.    ? oxyCODONE (OXY IR/ROXICODONE) 5 MG immediate release tablet Take 0.5-1 tablets (2.5-5 mg total) by mouth every 6 (six) hours as needed for severe pain. 5 tablet 0  ? pantoprazole (PROTONIX) 40 MG tablet Take 40 mg by mouth daily.    ? prednisoLONE acetate (PRED FORTE) 1 % ophthalmic suspension Place 1 drop into the right eye 3 (three) times daily.    ? valsartan (DIOVAN) 80 MG tablet Take 80 mg by mouth daily.    ? ?No current facility-administered medications for this encounter.  ? ? ?Physical Findings: ? height is 5' (1.524 m) and weight is 147 lb 6.4 oz (66.9 kg). Her temperature is 97.8 ?F (36.6 ?C). Her blood pressure is 149/71 (abnormal) and her pulse is 84. Her respiration is 20 and oxygen saturation is 99%. .    ? ?General: Alert and oriented, in no acute distress ?MSK: Good range of motion in right shoulder, able to abduct arm overhead ?Psychiatric: Judgment and insight  are intact. Affect is appropriate. ?Breast exam reveals excellent healing of the right breast following lumpectomy.  Scant glue remains over lumpectomy scar.  No oozing or bleeding present ? ?Lab Findings: ?Lab Results  ?Component Value Date  ? WBC 9.8 11/18/2020  ? HGB 13.7 11/18/2020  ? HCT 41.6 11/18/2020  ? MCV 95.6 11/18/2020  ? PLT 299 11/18/2020  ? ? ?_0 @ ? ?Radiographic Findings: ?MM Breast Surgical Specimen ? ?Result Date: 06/17/2021 ?CLINICAL DATA:  Status post lumpectomy today after earlier radioactive seed localization. EXAM: SPECIMEN RADIOGRAPH OF THE RIGHT BREAST COMPARISON:  Previous  exam(s). FINDINGS: Status post excision of the right breast. The radioactive seed and biopsy marker clip are present and appear completely intact within the specimen. Findings discussed with the OR staff during the procedure. IMPRESSION: Specimen radiograph of the right breast. Electronically Signed   By: Franki Cabot M.D.   On: 06/17/2021 11:35 ? ?MM RT RADIOACTIVE SEED LOC MAMMO GUIDE ? ?Result Date: 06/16/2021 ?CLINICAL DATA:  Patient is scheduled for RIGHT breast lumpectomy requiring preoperative radioactive seed localization. EXAM: MAMMOGRAPHIC GUIDED RADIOACTIVE SEED LOCALIZATION OF THE RIGHT BREAST COMPARISON:  Previous exam(s). FINDINGS: Patient presents for radioactive seed localization prior to lumpectomy. I met with the patient and we discussed the procedure of seed localization including benefits and alternatives. We discussed the high likelihood of a successful procedure. We discussed the risks of the procedure including infection, bleeding, tissue injury and further surgery. We discussed the low dose of radioactivity involved in the procedure. Informed, written consent was given. The usual time-out protocol was performed immediately prior to the procedure. Using mammographic guidance, sterile technique, 1% lidocaine and an I-125 radioactive seed, the X shaped clip within the upper RIGHT breast was localized using a superior approach. The follow-up mammogram images confirm the seed in the expected location and were marked for Dr. Barry Dienes. Follow-up survey of the patient confirms presence of the radioactive seed. Order number of I-125 seed:  [id number]. Total activity:  8.832 millicuries reference Date: 05/27/2021 The patient tolerated the procedure well and was released from the [place]. She was given instructions regarding seed removal. IMPRESSION: Radioactive seed localization right breast. No apparent complications. Electronically Signed   By: Franki Cabot M.D.   On: 06/16/2021 13:29   ? ?Impression/Plan: ?We discussed adjuvant radiotherapy today.  I also asked if she made a decision regarding antiestrogen therapy.  The patient has decided she does not want antiestrogen therapy but she wants to proceed with adjuvant radiation.  After considering her options following her consultation, the patient and her family decided that she wants to pursue the standard hypofractionated 3-week course to her breast rather than the 1 week regimen.   I reviewed the logistics, benefits, risks, and potential side effects of this treatment in detail.  Her consent form was previously signed and placed in her chart at consultation. ? ?The patient asked good questions which I answered to her satisfaction. She is enthusiastic about proceeding with treatment.  We will proceed with CT simulation today and start her treatment on April 3, anticipated 15 fractions to the whole breast, no boost. ? ?On date of service, in total, I spent 30 minutes on this encounter. Patient was seen in person.  ?_____________________________________ ? ? ?Eppie Gibson, MD ? ?This document serves as a record of services personally performed by Eppie Gibson, MD. It was created on her behalf by Roney Mans, a trained medical scribe. The creation of this record is based on the scribe's personal  observations and the provider's statements to them. This document has been checked and approved by the attending provider. ? ?

## 2021-07-10 ENCOUNTER — Ambulatory Visit: Payer: Medicare Other | Admitting: Radiation Oncology

## 2021-07-10 ENCOUNTER — Ambulatory Visit: Payer: Medicare Other | Admitting: Hematology and Oncology

## 2021-07-10 ENCOUNTER — Encounter: Payer: Self-pay | Admitting: Radiation Oncology

## 2021-07-10 ENCOUNTER — Ambulatory Visit
Admission: RE | Admit: 2021-07-10 | Discharge: 2021-07-10 | Disposition: A | Discharge status: Home / Self Care | Payer: Medicare Other | Source: Ambulatory Visit | Attending: Radiation Oncology | Admitting: Radiation Oncology

## 2021-07-10 ENCOUNTER — Other Ambulatory Visit: Payer: Self-pay

## 2021-07-10 VITALS — BP 149/71 | HR 84 | Temp 97.8°F | Resp 20 | Ht 60.0 in | Wt 147.4 lb

## 2021-07-10 DIAGNOSIS — Z170 Estrogen receptor positive status [ER+]: Secondary | ICD-10-CM | POA: Insufficient documentation

## 2021-07-10 DIAGNOSIS — C50212 Malignant neoplasm of upper-inner quadrant of left female breast: Secondary | ICD-10-CM

## 2021-07-10 DIAGNOSIS — D0511 Intraductal carcinoma in situ of right breast: Secondary | ICD-10-CM | POA: Insufficient documentation

## 2021-07-13 ENCOUNTER — Ambulatory Visit: Payer: Medicare Other | Admitting: Radiation Oncology

## 2021-07-16 ENCOUNTER — Encounter: Payer: Self-pay | Admitting: *Deleted

## 2021-07-17 DIAGNOSIS — D0511 Intraductal carcinoma in situ of right breast: Secondary | ICD-10-CM | POA: Diagnosis not present

## 2021-07-17 DIAGNOSIS — C50212 Malignant neoplasm of upper-inner quadrant of left female breast: Secondary | ICD-10-CM | POA: Diagnosis not present

## 2021-07-17 DIAGNOSIS — Z170 Estrogen receptor positive status [ER+]: Secondary | ICD-10-CM | POA: Diagnosis not present

## 2021-07-20 ENCOUNTER — Ambulatory Visit
Admission: RE | Admit: 2021-07-20 | Discharge: 2021-07-20 | Disposition: A | Discharge status: Home / Self Care | Payer: Medicare Other | Source: Ambulatory Visit | Attending: Radiation Oncology | Admitting: Radiation Oncology

## 2021-07-20 ENCOUNTER — Inpatient Hospital Stay: Payer: Medicare Other | Attending: Hematology and Oncology

## 2021-07-20 ENCOUNTER — Other Ambulatory Visit: Payer: Self-pay

## 2021-07-20 DIAGNOSIS — D0511 Intraductal carcinoma in situ of right breast: Secondary | ICD-10-CM | POA: Insufficient documentation

## 2021-07-20 DIAGNOSIS — C50212 Malignant neoplasm of upper-inner quadrant of left female breast: Secondary | ICD-10-CM | POA: Diagnosis not present

## 2021-07-20 DIAGNOSIS — Z170 Estrogen receptor positive status [ER+]: Secondary | ICD-10-CM | POA: Diagnosis not present

## 2021-07-20 DIAGNOSIS — Z510 Encounter for antineoplastic radiation therapy: Secondary | ICD-10-CM | POA: Diagnosis not present

## 2021-07-21 ENCOUNTER — Ambulatory Visit
Admission: RE | Admit: 2021-07-21 | Discharge: 2021-07-21 | Disposition: A | Discharge status: Home / Self Care | Payer: Medicare Other | Source: Ambulatory Visit | Attending: Radiation Oncology | Admitting: Radiation Oncology

## 2021-07-21 ENCOUNTER — Inpatient Hospital Stay: Payer: Medicare Other

## 2021-07-21 DIAGNOSIS — D0511 Intraductal carcinoma in situ of right breast: Secondary | ICD-10-CM

## 2021-07-21 DIAGNOSIS — Z510 Encounter for antineoplastic radiation therapy: Secondary | ICD-10-CM | POA: Diagnosis not present

## 2021-07-21 DIAGNOSIS — Z170 Estrogen receptor positive status [ER+]: Secondary | ICD-10-CM | POA: Diagnosis not present

## 2021-07-21 DIAGNOSIS — C50212 Malignant neoplasm of upper-inner quadrant of left female breast: Secondary | ICD-10-CM | POA: Diagnosis not present

## 2021-07-21 MED ORDER — RADIAPLEXRX EX GEL: Freq: Once | CUTANEOUS | Status: AC

## 2021-07-21 NOTE — Progress Notes (Signed)
Pt here for patient teaching.   ? ?Pt given Radiation and You booklet, skin care instructions, and Radiaplex gel. Jethro Poling is still on back order, advised to use OTC aluminum free deodorant)  ? ?Reviewed areas of pertinence such as fatigue, hair loss, skin changes, breast tenderness, and breast swelling .  ? ?Pt able to give teach back of to pat skin, use unscented/gentle soap, and drink plenty of water,apply Radiaplex bid, avoid applying anything to skin within 4 hours of treatment, avoid wearing an under wire bra, and to use an electric razor if they must shave.  ? ?Pt demonstrated understanding and verbalizes understanding of information given and will contact nursing with any questions or concerns.   ? ?Http://rtanswers.org/treatmentinformation/whattoexpect/index ?  ? ? ? ? ? ? ?

## 2021-07-22 ENCOUNTER — Ambulatory Visit
Admission: RE | Admit: 2021-07-22 | Discharge: 2021-07-22 | Disposition: A | Discharge status: Home / Self Care | Payer: Medicare Other | Source: Ambulatory Visit | Attending: Radiation Oncology | Admitting: Radiation Oncology

## 2021-07-22 ENCOUNTER — Inpatient Hospital Stay: Payer: Medicare Other

## 2021-07-22 ENCOUNTER — Other Ambulatory Visit: Payer: Self-pay

## 2021-07-22 DIAGNOSIS — Z170 Estrogen receptor positive status [ER+]: Secondary | ICD-10-CM | POA: Diagnosis not present

## 2021-07-22 DIAGNOSIS — C50212 Malignant neoplasm of upper-inner quadrant of left female breast: Secondary | ICD-10-CM | POA: Diagnosis not present

## 2021-07-22 DIAGNOSIS — D0511 Intraductal carcinoma in situ of right breast: Secondary | ICD-10-CM | POA: Diagnosis not present

## 2021-07-22 DIAGNOSIS — Z510 Encounter for antineoplastic radiation therapy: Secondary | ICD-10-CM | POA: Diagnosis not present

## 2021-07-23 ENCOUNTER — Inpatient Hospital Stay: Payer: Medicare Other

## 2021-07-23 ENCOUNTER — Ambulatory Visit
Admission: RE | Admit: 2021-07-23 | Discharge: 2021-07-23 | Disposition: A | Discharge status: Home / Self Care | Payer: Medicare Other | Source: Ambulatory Visit | Attending: Radiation Oncology | Admitting: Radiation Oncology

## 2021-07-23 DIAGNOSIS — Z510 Encounter for antineoplastic radiation therapy: Secondary | ICD-10-CM | POA: Diagnosis not present

## 2021-07-23 DIAGNOSIS — C50212 Malignant neoplasm of upper-inner quadrant of left female breast: Secondary | ICD-10-CM | POA: Diagnosis not present

## 2021-07-23 DIAGNOSIS — D0511 Intraductal carcinoma in situ of right breast: Secondary | ICD-10-CM | POA: Diagnosis not present

## 2021-07-23 DIAGNOSIS — Z170 Estrogen receptor positive status [ER+]: Secondary | ICD-10-CM | POA: Diagnosis not present

## 2021-07-24 ENCOUNTER — Inpatient Hospital Stay: Payer: Medicare Other

## 2021-07-24 ENCOUNTER — Ambulatory Visit
Admission: RE | Admit: 2021-07-24 | Discharge: 2021-07-24 | Disposition: A | Discharge status: Home / Self Care | Payer: Medicare Other | Source: Ambulatory Visit | Attending: Radiation Oncology | Admitting: Radiation Oncology

## 2021-07-24 ENCOUNTER — Other Ambulatory Visit: Payer: Self-pay

## 2021-07-24 DIAGNOSIS — Z510 Encounter for antineoplastic radiation therapy: Secondary | ICD-10-CM | POA: Diagnosis not present

## 2021-07-24 DIAGNOSIS — C50212 Malignant neoplasm of upper-inner quadrant of left female breast: Secondary | ICD-10-CM | POA: Diagnosis not present

## 2021-07-24 DIAGNOSIS — Z170 Estrogen receptor positive status [ER+]: Secondary | ICD-10-CM | POA: Diagnosis not present

## 2021-07-24 DIAGNOSIS — D0511 Intraductal carcinoma in situ of right breast: Secondary | ICD-10-CM | POA: Diagnosis not present

## 2021-07-27 ENCOUNTER — Ambulatory Visit
Admission: RE | Admit: 2021-07-27 | Discharge: 2021-07-27 | Disposition: A | Discharge status: Home / Self Care | Payer: Medicare Other | Source: Ambulatory Visit | Attending: Radiation Oncology | Admitting: Radiation Oncology

## 2021-07-27 ENCOUNTER — Other Ambulatory Visit: Payer: Self-pay

## 2021-07-27 DIAGNOSIS — D0511 Intraductal carcinoma in situ of right breast: Secondary | ICD-10-CM | POA: Diagnosis not present

## 2021-07-27 DIAGNOSIS — Z170 Estrogen receptor positive status [ER+]: Secondary | ICD-10-CM | POA: Diagnosis not present

## 2021-07-27 DIAGNOSIS — C50212 Malignant neoplasm of upper-inner quadrant of left female breast: Secondary | ICD-10-CM | POA: Diagnosis not present

## 2021-07-27 DIAGNOSIS — Z510 Encounter for antineoplastic radiation therapy: Secondary | ICD-10-CM | POA: Diagnosis not present

## 2021-07-28 ENCOUNTER — Ambulatory Visit
Admission: RE | Admit: 2021-07-28 | Discharge: 2021-07-28 | Disposition: A | Discharge status: Home / Self Care | Payer: Medicare Other | Source: Ambulatory Visit | Attending: Radiation Oncology | Admitting: Radiation Oncology

## 2021-07-28 DIAGNOSIS — D0511 Intraductal carcinoma in situ of right breast: Secondary | ICD-10-CM | POA: Diagnosis not present

## 2021-07-28 DIAGNOSIS — Z170 Estrogen receptor positive status [ER+]: Secondary | ICD-10-CM | POA: Diagnosis not present

## 2021-07-28 DIAGNOSIS — Z510 Encounter for antineoplastic radiation therapy: Secondary | ICD-10-CM | POA: Diagnosis not present

## 2021-07-28 DIAGNOSIS — C50212 Malignant neoplasm of upper-inner quadrant of left female breast: Secondary | ICD-10-CM | POA: Diagnosis not present

## 2021-07-29 ENCOUNTER — Ambulatory Visit
Admission: RE | Admit: 2021-07-29 | Discharge: 2021-07-29 | Disposition: A | Discharge status: Home / Self Care | Payer: Medicare Other | Source: Ambulatory Visit | Attending: Radiation Oncology | Admitting: Radiation Oncology

## 2021-07-29 ENCOUNTER — Other Ambulatory Visit: Payer: Self-pay

## 2021-07-29 DIAGNOSIS — D0511 Intraductal carcinoma in situ of right breast: Secondary | ICD-10-CM | POA: Diagnosis not present

## 2021-07-29 DIAGNOSIS — Z510 Encounter for antineoplastic radiation therapy: Secondary | ICD-10-CM | POA: Diagnosis not present

## 2021-07-29 DIAGNOSIS — Z170 Estrogen receptor positive status [ER+]: Secondary | ICD-10-CM | POA: Diagnosis not present

## 2021-07-29 DIAGNOSIS — C50212 Malignant neoplasm of upper-inner quadrant of left female breast: Secondary | ICD-10-CM | POA: Diagnosis not present

## 2021-07-30 ENCOUNTER — Ambulatory Visit
Admission: RE | Admit: 2021-07-30 | Discharge: 2021-07-30 | Disposition: A | Discharge status: Home / Self Care | Payer: Medicare Other | Source: Ambulatory Visit | Attending: Radiation Oncology | Admitting: Radiation Oncology

## 2021-07-30 DIAGNOSIS — D0511 Intraductal carcinoma in situ of right breast: Secondary | ICD-10-CM | POA: Diagnosis not present

## 2021-07-30 DIAGNOSIS — Z170 Estrogen receptor positive status [ER+]: Secondary | ICD-10-CM | POA: Diagnosis not present

## 2021-07-30 DIAGNOSIS — C50212 Malignant neoplasm of upper-inner quadrant of left female breast: Secondary | ICD-10-CM | POA: Diagnosis not present

## 2021-07-30 DIAGNOSIS — Z510 Encounter for antineoplastic radiation therapy: Secondary | ICD-10-CM | POA: Diagnosis not present

## 2021-07-31 ENCOUNTER — Other Ambulatory Visit: Payer: Self-pay

## 2021-07-31 ENCOUNTER — Ambulatory Visit
Admission: RE | Admit: 2021-07-31 | Discharge: 2021-07-31 | Disposition: A | Discharge status: Home / Self Care | Payer: Medicare Other | Source: Ambulatory Visit | Attending: Radiation Oncology | Admitting: Radiation Oncology

## 2021-07-31 DIAGNOSIS — Z510 Encounter for antineoplastic radiation therapy: Secondary | ICD-10-CM | POA: Diagnosis not present

## 2021-07-31 DIAGNOSIS — C50212 Malignant neoplasm of upper-inner quadrant of left female breast: Secondary | ICD-10-CM | POA: Diagnosis not present

## 2021-07-31 DIAGNOSIS — D0511 Intraductal carcinoma in situ of right breast: Secondary | ICD-10-CM | POA: Diagnosis not present

## 2021-07-31 DIAGNOSIS — Z170 Estrogen receptor positive status [ER+]: Secondary | ICD-10-CM | POA: Diagnosis not present

## 2021-08-03 ENCOUNTER — Other Ambulatory Visit: Payer: Self-pay

## 2021-08-03 ENCOUNTER — Ambulatory Visit
Admission: RE | Admit: 2021-08-03 | Discharge: 2021-08-03 | Disposition: A | Discharge status: Home / Self Care | Payer: Medicare Other | Source: Ambulatory Visit | Attending: Radiation Oncology | Admitting: Radiation Oncology

## 2021-08-03 DIAGNOSIS — D0511 Intraductal carcinoma in situ of right breast: Secondary | ICD-10-CM | POA: Diagnosis not present

## 2021-08-03 DIAGNOSIS — Z170 Estrogen receptor positive status [ER+]: Secondary | ICD-10-CM | POA: Diagnosis not present

## 2021-08-03 DIAGNOSIS — C50212 Malignant neoplasm of upper-inner quadrant of left female breast: Secondary | ICD-10-CM | POA: Diagnosis not present

## 2021-08-03 DIAGNOSIS — Z510 Encounter for antineoplastic radiation therapy: Secondary | ICD-10-CM | POA: Diagnosis not present

## 2021-08-04 ENCOUNTER — Ambulatory Visit
Admission: RE | Admit: 2021-08-04 | Discharge: 2021-08-04 | Disposition: A | Discharge status: Home / Self Care | Payer: Medicare Other | Source: Ambulatory Visit | Attending: Radiation Oncology | Admitting: Radiation Oncology

## 2021-08-04 ENCOUNTER — Other Ambulatory Visit: Payer: Self-pay

## 2021-08-04 DIAGNOSIS — Z170 Estrogen receptor positive status [ER+]: Secondary | ICD-10-CM | POA: Diagnosis not present

## 2021-08-04 DIAGNOSIS — C50212 Malignant neoplasm of upper-inner quadrant of left female breast: Secondary | ICD-10-CM | POA: Diagnosis not present

## 2021-08-04 DIAGNOSIS — D0511 Intraductal carcinoma in situ of right breast: Secondary | ICD-10-CM | POA: Diagnosis not present

## 2021-08-04 DIAGNOSIS — Z510 Encounter for antineoplastic radiation therapy: Secondary | ICD-10-CM | POA: Diagnosis not present

## 2021-08-04 LAB — RAD ONC ARIA SESSION SUMMARY
Course Elapsed Days: 15
Plan Fractions Treated to Date: 12
Plan Prescribed Dose Per Fraction: 2.67 Gy
Plan Total Fractions Prescribed: 15
Plan Total Prescribed Dose: 40.05 Gy
Reference Point Dosage Given to Date: 32.04 Gy
Reference Point Session Dosage Given: 2.67 Gy
Session Number: 12

## 2021-08-05 ENCOUNTER — Ambulatory Visit
Admission: RE | Admit: 2021-08-05 | Discharge: 2021-08-05 | Disposition: A | Discharge status: Home / Self Care | Payer: Medicare Other | Source: Ambulatory Visit | Attending: Radiation Oncology | Admitting: Radiation Oncology

## 2021-08-05 ENCOUNTER — Other Ambulatory Visit: Payer: Self-pay

## 2021-08-05 DIAGNOSIS — Z510 Encounter for antineoplastic radiation therapy: Secondary | ICD-10-CM | POA: Diagnosis not present

## 2021-08-05 DIAGNOSIS — D0511 Intraductal carcinoma in situ of right breast: Secondary | ICD-10-CM | POA: Diagnosis not present

## 2021-08-05 DIAGNOSIS — Z170 Estrogen receptor positive status [ER+]: Secondary | ICD-10-CM | POA: Diagnosis not present

## 2021-08-05 DIAGNOSIS — C50212 Malignant neoplasm of upper-inner quadrant of left female breast: Secondary | ICD-10-CM | POA: Diagnosis not present

## 2021-08-05 LAB — RAD ONC ARIA SESSION SUMMARY
Course Elapsed Days: 16
Plan Fractions Treated to Date: 13
Plan Prescribed Dose Per Fraction: 2.67 Gy
Plan Total Fractions Prescribed: 15
Plan Total Prescribed Dose: 40.05 Gy
Reference Point Dosage Given to Date: 34.71 Gy
Reference Point Session Dosage Given: 2.67 Gy
Session Number: 13

## 2021-08-06 ENCOUNTER — Encounter: Payer: Self-pay | Admitting: *Deleted

## 2021-08-06 ENCOUNTER — Other Ambulatory Visit: Payer: Self-pay

## 2021-08-06 ENCOUNTER — Ambulatory Visit
Admission: RE | Admit: 2021-08-06 | Discharge: 2021-08-06 | Disposition: A | Discharge status: Home / Self Care | Payer: Medicare Other | Source: Ambulatory Visit | Attending: Radiation Oncology | Admitting: Radiation Oncology

## 2021-08-06 DIAGNOSIS — C50212 Malignant neoplasm of upper-inner quadrant of left female breast: Secondary | ICD-10-CM | POA: Diagnosis not present

## 2021-08-06 DIAGNOSIS — Z170 Estrogen receptor positive status [ER+]: Secondary | ICD-10-CM | POA: Diagnosis not present

## 2021-08-06 DIAGNOSIS — Z510 Encounter for antineoplastic radiation therapy: Secondary | ICD-10-CM | POA: Diagnosis not present

## 2021-08-06 DIAGNOSIS — D0511 Intraductal carcinoma in situ of right breast: Secondary | ICD-10-CM | POA: Diagnosis not present

## 2021-08-06 LAB — RAD ONC ARIA SESSION SUMMARY
Course Elapsed Days: 17
Plan Fractions Treated to Date: 14
Plan Prescribed Dose Per Fraction: 2.67 Gy
Plan Total Fractions Prescribed: 15
Plan Total Prescribed Dose: 40.05 Gy
Reference Point Dosage Given to Date: 37.38 Gy
Reference Point Session Dosage Given: 2.67 Gy
Session Number: 14

## 2021-08-07 ENCOUNTER — Encounter: Payer: Self-pay | Admitting: Radiation Oncology

## 2021-08-07 ENCOUNTER — Other Ambulatory Visit: Payer: Self-pay

## 2021-08-07 ENCOUNTER — Ambulatory Visit
Admission: RE | Admit: 2021-08-07 | Discharge: 2021-08-07 | Disposition: A | Discharge status: Home / Self Care | Payer: Medicare Other | Source: Ambulatory Visit | Attending: Radiation Oncology | Admitting: Radiation Oncology

## 2021-08-07 DIAGNOSIS — D0511 Intraductal carcinoma in situ of right breast: Secondary | ICD-10-CM | POA: Diagnosis not present

## 2021-08-07 DIAGNOSIS — Z510 Encounter for antineoplastic radiation therapy: Secondary | ICD-10-CM | POA: Diagnosis not present

## 2021-08-07 DIAGNOSIS — Z170 Estrogen receptor positive status [ER+]: Secondary | ICD-10-CM | POA: Diagnosis not present

## 2021-08-07 DIAGNOSIS — C50212 Malignant neoplasm of upper-inner quadrant of left female breast: Secondary | ICD-10-CM | POA: Diagnosis not present

## 2021-08-07 LAB — RAD ONC ARIA SESSION SUMMARY
Course Elapsed Days: 18
Plan Fractions Treated to Date: 15
Plan Prescribed Dose Per Fraction: 2.67 Gy
Plan Total Fractions Prescribed: 15
Plan Total Prescribed Dose: 40.05 Gy
Reference Point Dosage Given to Date: 40.05 Gy
Reference Point Session Dosage Given: 2.67 Gy
Session Number: 15

## 2021-08-13 DIAGNOSIS — Z0000 Encounter for general adult medical examination without abnormal findings: Secondary | ICD-10-CM | POA: Diagnosis not present

## 2021-08-13 DIAGNOSIS — Z1331 Encounter for screening for depression: Secondary | ICD-10-CM | POA: Diagnosis not present

## 2021-08-13 DIAGNOSIS — I700 Atherosclerosis of aorta: Secondary | ICD-10-CM | POA: Diagnosis not present

## 2021-08-13 DIAGNOSIS — M8588 Other specified disorders of bone density and structure, other site: Secondary | ICD-10-CM | POA: Diagnosis not present

## 2021-08-13 DIAGNOSIS — I10 Essential (primary) hypertension: Secondary | ICD-10-CM | POA: Diagnosis not present

## 2021-08-13 DIAGNOSIS — E782 Mixed hyperlipidemia: Secondary | ICD-10-CM | POA: Diagnosis not present

## 2021-08-13 DIAGNOSIS — E559 Vitamin D deficiency, unspecified: Secondary | ICD-10-CM | POA: Diagnosis not present

## 2021-08-14 DIAGNOSIS — H35341 Macular cyst, hole, or pseudohole, right eye: Secondary | ICD-10-CM | POA: Diagnosis not present

## 2021-08-14 DIAGNOSIS — H43813 Vitreous degeneration, bilateral: Secondary | ICD-10-CM | POA: Diagnosis not present

## 2021-09-04 ENCOUNTER — Telehealth: Payer: Self-pay

## 2021-09-04 DIAGNOSIS — H59031 Cystoid macular edema following cataract surgery, right eye: Secondary | ICD-10-CM | POA: Diagnosis not present

## 2021-09-04 DIAGNOSIS — H43813 Vitreous degeneration, bilateral: Secondary | ICD-10-CM | POA: Diagnosis not present

## 2021-09-04 NOTE — Telephone Encounter (Signed)
Patient called to state she would prefer to come in person for her 1 month F/U with Dr. Isidore Moos rather than do a telephone visit, which was also offered to her. Assured patient Dr. Isidore Moos would be glad to see her in person, and confirmed she was aware of date/time of appointment. Denied any other needs at this time, but she has my direct number should she need something before her visit next week.

## 2021-09-09 ENCOUNTER — Telehealth: Payer: Self-pay | Admitting: *Deleted

## 2021-09-11 ENCOUNTER — Ambulatory Visit
Admission: RE | Admit: 2021-09-11 | Discharge: 2021-09-11 | Disposition: A | Discharge status: Home / Self Care | Payer: Medicare Other | Source: Ambulatory Visit | Attending: Radiation Oncology | Admitting: Radiation Oncology

## 2021-09-11 ENCOUNTER — Encounter: Payer: Self-pay | Admitting: Radiation Oncology

## 2021-09-11 ENCOUNTER — Other Ambulatory Visit: Payer: Self-pay

## 2021-09-11 VITALS — BP 148/70 | HR 85 | Temp 97.5°F | Resp 16 | Ht 61.0 in | Wt 147.2 lb

## 2021-09-11 DIAGNOSIS — Z79899 Other long term (current) drug therapy: Secondary | ICD-10-CM | POA: Diagnosis not present

## 2021-09-11 DIAGNOSIS — Z170 Estrogen receptor positive status [ER+]: Secondary | ICD-10-CM | POA: Diagnosis not present

## 2021-09-11 DIAGNOSIS — D0511 Intraductal carcinoma in situ of right breast: Secondary | ICD-10-CM | POA: Diagnosis not present

## 2021-09-11 DIAGNOSIS — Z923 Personal history of irradiation: Secondary | ICD-10-CM | POA: Insufficient documentation

## 2021-09-11 MED ORDER — RADIAPLEXRX EX GEL: Freq: Once | CUTANEOUS | Status: AC

## 2021-09-11 NOTE — Progress Notes (Signed)
Valerie Cooper presents today for follow-up after completing radiation to her right breast on 08/07/2021  Pain: Reports occasional twinges to the breast, but states they are mild and tolerable Skin: Residual hyperpigmentation and dry peeling to upper most layer of skin around areola and under breast fold. Provided more radiaplex  ROM: Patient denies any issues or limitations  Lymphedema: Patient denies MedOnc F/U: 11/17/2021 with Dr. Benay Pike Other issues of note: Denies any lingering fatigue or tiredness. Recently returned from visiting her daughter in [place]. Overall reports she's doing well and is pleased with her continued progress/recovery  Pt reports Yes No Comments  Tamoxifen '[]'$  '[x]'$  Discuss starting during next visit with Dr. Chryl Heck  Letrozole '[]'$  '[x]'$    Anastrazole '[]'$  '[x]'$    Mammogram '[x]'$  Date: TBD '[]'$ 

## 2021-09-11 NOTE — Progress Notes (Signed)
Radiation Oncology         [phone] ________________________________  Name: Valerie Cooper MRN: [id number]  Date: 09/11/2021  DOB: 1933-07-22  Follow-Up Visit Note  Outpatient  CC: [name], MD  [name], MD  Diagnosis and Prior Radiotherapy:    ICD-10-CM   1. Malignant neoplasm of upper-inner quadrant of right breast in female, estrogen receptor positive ([place])  C50.212 hyaluronate sodium (RADIAPLEXRX) gel   Z17.0     2. Ductal carcinoma in situ (DCIS) of right breast  D05.11     Stage 0 (cTis (DCIS)  CHIEF COMPLAINT: Here for follow-up and surveillance of breast DCIS    Narrative:  The patient returns today for routine follow-up.  [name] presents today for follow-up after completing radiation to her right breast on 08/07/2021  Pain: Reports occasional twinges to the breast, but states they are mild and tolerable Skin: Residual hyperpigmentation and dry peeling to upper most layer of skin around areola and under breast fold. Provided more radiaplex  ROM: Patient denies any issues or limitations  Lymphedema: Patient denies MedOnc F/U: 11/17/2021 with Dr. Benay Pike Other issues of note: Denies any lingering fatigue or tiredness. Recently returned from visiting her daughter in [place]. Overall reports she's doing well and is pleased with her continued progress/recovery  Pt reports Yes No Comments  Tamoxifen '[]'$  '[x]'$  Discuss starting during next visit with Dr. Chryl Heck  Letrozole '[]'$  '[x]'$    Anastrazole '[]'$  '[x]'$    Mammogram '[x]'$  Date: TBD '[]'$                                   ALLERGIES:  is allergic to antivert [meclizine hcl] and other.  Meds: Current Outpatient Medications  Medication Sig Dispense Refill   acetaminophen (TYLENOL) 325 MG tablet Take 2 tablets (650 mg total) by mouth every 6 (six) hours. (Patient taking differently: Take 325 mg by mouth every 4 (four) hours as needed for moderate pain, fever or headache.)     amLODipine (NORVASC) 5 MG tablet  Take by mouth 2 (two) times daily.     atorvastatin (LIPITOR) 20 MG tablet Take 20 mg by mouth daily.     cholecalciferol (VITAMIN D3) 25 MCG (1000 UNIT) tablet Take 1,000 Units by mouth daily.     ketorolac (ACULAR) 0.5 % ophthalmic solution Place 1 drop into the right eye 3 (three) times daily.     oxyCODONE (OXY IR/ROXICODONE) 5 MG immediate release tablet Take 0.5-1 tablets (2.5-5 mg total) by mouth every 6 (six) hours as needed for severe pain. 5 tablet 0   pantoprazole (PROTONIX) 40 MG tablet Take 40 mg by mouth daily.     prednisoLONE acetate (PRED FORTE) 1 % ophthalmic suspension Place 1 drop into the right eye 3 (three) times daily.     valsartan (DIOVAN) 80 MG tablet Take 80 mg by mouth daily.     No current facility-administered medications for this encounter.    Physical Findings: The patient is in no acute distress. Patient is alert and oriented.  height is '5\' 1"'$  (1.549 m) and weight is 147 lb 3.2 oz (66.8 kg). Her oral temperature is 97.5 F (36.4 C) (abnormal). Her blood pressure is 148/70 (abnormal) and her pulse is 85. Her respiration is 16 and oxygen saturation is 100%. .    Satisfactory skin healing in radiotherapy fields.  She has residual hyperpigmentation and peeling dry skin over the right breast.  Lab Findings: Lab Results  Component Value Date   WBC 9.8 11/18/2020   HGB 13.7 11/18/2020   HCT 41.6 11/18/2020   MCV 95.6 11/18/2020   PLT 299 11/18/2020    Radiographic Findings: No results found.  Impression/Plan: Healing well from radiotherapy to the breast tissue.  Continue skin care with topical Vitamin E Oil and / or lotion for at least 2 more months for further healing.  She asked for another tube of radio Plex and I suggested she use this in the morning and vitamin E products at night.  I encouraged her to continue with yearly mammography as appropriate (for intact breast tissue) and followup with medical oncology. I will see her back on an as-needed  basis. I have encouraged her to call if she has any issues or concerns in the future. I wished her the very best.  I noticed that her follow-up with medical oncology is not until August and she has not yet started an antiestrogen therapy.  I sent a message to medical oncology to coordinate care.  The patient knows how to reach out to medical oncology as well.  On date of service, in total, I spent 25 minutes on this encounter. Patient was seen in person.  _____________________________________   Eppie Gibson, MD

## 2021-09-15 ENCOUNTER — Telehealth: Payer: Self-pay | Admitting: *Deleted

## 2021-09-16 DIAGNOSIS — H59031 Cystoid macular edema following cataract surgery, right eye: Secondary | ICD-10-CM | POA: Diagnosis not present

## 2021-09-16 DIAGNOSIS — H43813 Vitreous degeneration, bilateral: Secondary | ICD-10-CM | POA: Diagnosis not present

## 2021-09-21 ENCOUNTER — Inpatient Hospital Stay: Payer: Medicare Other | Attending: Hematology and Oncology | Admitting: Hematology and Oncology

## 2021-09-21 ENCOUNTER — Other Ambulatory Visit: Payer: Self-pay

## 2021-09-21 ENCOUNTER — Encounter: Payer: Self-pay | Admitting: Hematology and Oncology

## 2021-09-21 DIAGNOSIS — C50212 Malignant neoplasm of upper-inner quadrant of left female breast: Secondary | ICD-10-CM

## 2021-09-21 DIAGNOSIS — D0511 Intraductal carcinoma in situ of right breast: Secondary | ICD-10-CM | POA: Insufficient documentation

## 2021-09-21 DIAGNOSIS — Z923 Personal history of irradiation: Secondary | ICD-10-CM | POA: Insufficient documentation

## 2021-09-21 DIAGNOSIS — Z170 Estrogen receptor positive status [ER+]: Secondary | ICD-10-CM

## 2021-09-21 DIAGNOSIS — Z79899 Other long term (current) drug therapy: Secondary | ICD-10-CM | POA: Diagnosis not present

## 2021-09-21 NOTE — Progress Notes (Signed)
[place] NOTE  Patient Care Team: [name], MD as PCP - General (Internal Medicine) Mauro Kaufmann, RN as Oncology Nurse Navigator Rockwell Germany, RN as Oncology Nurse Navigator  CHIEF COMPLAINTS/PURPOSE OF CONSULTATION:  Newly diagnosed breast cancer  HISTORY OF PRESENTING ILLNESS:  Valerie Cooper 88 y.o. female is here because of recent diagnosis of right sided breast cancer  I reviewed her records extensively and collaborated the history with the patient.  SUMMARY OF ONCOLOGIC HISTORY: Oncology History  Malignant neoplasm of upper-inner quadrant of right breast in female, estrogen receptor positive ([place])  04/29/2021 Cancer Staging   Staging form: Breast, AJCC 8th Edition - Clinical stage from 04/29/2021: Stage 0 (cTis (DCIS), cN0, cM0, ER+, PR+) - Signed by Gardenia Phlegm, NP on 05/06/2021 Stage prefix: Initial diagnosis    05/06/2021 Initial Diagnosis   Malignant neoplasm of upper-inner quadrant of right breast in female, estrogen receptor positive ([place])     - 08/07/2021 Radiation Therapy   She completed adjuvant radiation 08/07/2021     Patient is here for follow-up after completion of adjuvant radiation.  She has healed well. She is here with her cousin as well as her daughter Valerie Cooper was on the phone Rest of the pertinent 10 point ROS reviewed and negative.  MEDICAL HISTORY:  Past Medical History:  Diagnosis Date   Cancer ([place]) 04/2021   right breast DCIS   Cataract    Complication of anesthesia, + pseudocholinesterase deficiency    It takes awhile for me to wake up "   DJD (degenerative joint disease)    Family history of anesthesia complication    GERD (gastroesophageal reflux disease)    H/O colonoscopy 04/20/2003   Hyperlipidemia    Hypertension    Osteoporosis    Shingles 04/20/1999   right leg   Ulcer 1980s   PUD ?due to ASA intake    SURGICAL HISTORY: Past Surgical History:  Procedure Laterality Date    ABDOMINAL HYSTERECTOMY  1975   BREAST LUMPECTOMY WITH RADIOACTIVE SEED LOCALIZATION Right 06/17/2021   Procedure: RIGHT BREAST SEED LOCALIZED LUMPECTOMY;  Surgeon: Stark Klein, MD;  Location: [facility];  Service: General;  Laterality: Right;   CHOLECYSTECTOMY  05/29/2012   Dr Lucia Gaskins   CHOLECYSTECTOMY N/A 05/29/2012   Procedure: LAPAROSCOPIC CHOLECYSTECTOMY WITH INTRAOPERATIVE CHOLANGIOGRAM;  Surgeon: Shann Medal, MD;  Location: [facility];  Service: General;  Laterality: N/A;   FRACTURE SURGERY     L arm   ROTATOR CUFF REPAIR Bilateral 2005   4 surgeries    SOCIAL HISTORY: Social History   Socioeconomic History   Marital status: Widowed    Spouse name: Not on file   Number of children: Not on file   Years of education: Not on file   Highest education level: Not on file  Occupational History   Not on file  Tobacco Use   Smoking status: Never   Smokeless tobacco: Never  Vaping Use   Vaping Use: Never used  Substance and Sexual Activity   Alcohol use: No   Drug use: No   Sexual activity: Not Currently    Birth control/protection: Surgical  Other Topics Concern   Not on file  Social History Narrative   Not on file   Social Determinants of Health   Financial Resource Strain: Not on file  Food Insecurity: Not on file  Transportation Needs: Not on file  Physical Activity: Not on file  Stress: Not on file  Social  Connections: Not on file  Intimate Partner Violence: Not on file    FAMILY HISTORY: Family History  Problem Relation Age of Onset   Stroke Mother    Stroke Father     ALLERGIES:  is allergic to antivert [meclizine hcl] and other.  MEDICATIONS:  Current Outpatient Medications  Medication Sig Dispense Refill   acetaminophen (TYLENOL) 325 MG tablet Take 2 tablets (650 mg total) by mouth every 6 (six) hours. (Patient taking differently: Take 325 mg by mouth every 4 (four) hours as needed for moderate pain, fever or headache.)     amLODipine  (NORVASC) 5 MG tablet Take by mouth 2 (two) times daily.     atorvastatin (LIPITOR) 20 MG tablet Take 20 mg by mouth daily.     cholecalciferol (VITAMIN D3) 25 MCG (1000 UNIT) tablet Take 1,000 Units by mouth daily.     ketorolac (ACULAR) 0.5 % ophthalmic solution Place 1 drop into the right eye 3 (three) times daily.     oxyCODONE (OXY IR/ROXICODONE) 5 MG immediate release tablet Take 0.5-1 tablets (2.5-5 mg total) by mouth every 6 (six) hours as needed for severe pain. 5 tablet 0   pantoprazole (PROTONIX) 40 MG tablet Take 40 mg by mouth daily.     prednisoLONE acetate (PRED FORTE) 1 % ophthalmic suspension Place 1 drop into the right eye 3 (three) times daily.     valsartan (DIOVAN) 80 MG tablet Take 80 mg by mouth daily.     No current facility-administered medications for this visit.    REVIEW OF SYSTEMS:   Constitutional: Denies fevers, chills or abnormal night sweats Eyes: Denies blurriness of vision, double vision or watery eyes Ears, nose, mouth, throat, and face: Denies mucositis or sore throat Respiratory: Denies cough, dyspnea or wheezes Cardiovascular: Denies palpitation, chest discomfort or lower extremity swelling Gastrointestinal:  Denies nausea, heartburn or change in bowel habits Skin: Denies abnormal skin rashes Lymphatics: Denies new lymphadenopathy or easy bruising Neurological:Denies numbness, tingling or new weaknesses Behavioral/Psych: Mood is stable, no new changes  Breast: Denies any palpable lumps or discharge All other systems were reviewed with the patient and are negative.  PHYSICAL EXAMINATION: ECOG PERFORMANCE STATUS: 0 - Asymptomatic  Vitals:   09/21/21 1220  BP: 132/68  Pulse: 76  Resp: 16  Temp: 97.9 F (36.6 C)  SpO2: 100%    Filed Weights   09/21/21 1220  Weight: 146 lb 9.6 oz (66.5 kg)    Physical examination deferred today in lieu of counseling  LABORATORY DATA:  I have reviewed the data as listed Lab Results  Component Value  Date   WBC 9.8 11/18/2020   HGB 13.7 11/18/2020   HCT 41.6 11/18/2020   MCV 95.6 11/18/2020   PLT 299 11/18/2020   Lab Results  Component Value Date   NA 139 06/15/2021   K 3.5 06/15/2021   CL 102 06/15/2021   CO2 26 06/15/2021    RADIOGRAPHIC STUDIES: I have personally reviewed the radiological reports and agreed with the findings in the report.  ASSESSMENT AND PLAN:   This is a very pleasant 88 year old female patient with newly diagnosed right breast DCIS, ER positive who is here for recommendations.  We have discussed the following details.  Malignant neoplasm of upper-inner quadrant of right breast in female, estrogen receptor positive ([place]) This is a very pleasant 88 year old female patient with right breast DCIS referred to medical oncology for recommendations She is now status post right breast lumpectomy and adjuvant radiation We have  once again today discussed about role of adjuvant antiestrogen therapy.  We have discussed about mechanism of action of tamoxifen as well as aromatase inhibitors and adverse effects from antiestrogen therapy.  After reviewing the side effects as well as options, patient wants to try tamoxifen.  She will return to clinic in 3 months.  We have discussed mechanism of action of tamoxifen, adverse effects including but not limited to fatigue, postmenopausal symptoms, small risk of DVT/PE, endometrial cancer in patients with intact uterus.  A benefit from tamoxifen apart from decreasing risk of recurrence of breast cancer is also improvement in bone density. She is clear that if tamoxifen messes with her quality of life, she does not want to proceed with any antiestrogen therapy.  She understands her benefits and risks. Return to clinic in 3 months. I spent 30 minutes in the care of this patient including history, review of records, counseling and coordination of care    All questions were answered. The patient knows to call the clinic with any  problems, questions or concerns.    Benay Pike, MD 09/21/21

## 2021-09-21 NOTE — Assessment & Plan Note (Addendum)
This is a very pleasant 88 year old female patient with right breast DCIS referred to medical oncology for recommendations She is now status post right breast lumpectomy and adjuvant radiation We have once again today discussed about role of adjuvant antiestrogen therapy.  We have discussed about mechanism of action of tamoxifen as well as aromatase inhibitors and adverse effects from antiestrogen therapy.  After reviewing the side effects as well as options, patient wants to try tamoxifen.  She will return to clinic in 3 months.  We have discussed mechanism of action of tamoxifen, adverse effects including but not limited to fatigue, postmenopausal symptoms, small risk of DVT/PE, endometrial cancer in patients with intact uterus.  A benefit from tamoxifen apart from decreasing risk of recurrence of breast cancer is also improvement in bone density. She is clear that if tamoxifen messes with her quality of life, she does not want to proceed with any antiestrogen therapy.  She understands her benefits and risks. Return to clinic in 3 months. I spent 30 minutes in the care of this patient including history, review of records, counseling and coordination of care

## 2021-09-22 ENCOUNTER — Other Ambulatory Visit: Payer: Self-pay | Admitting: *Deleted

## 2021-09-22 MED ORDER — TAMOXIFEN CITRATE 20 MG PO TABS: 20.0000 mg | ORAL_TABLET | Freq: Every day | ORAL | 3 refills | Status: DC

## 2021-09-23 NOTE — Progress Notes (Signed)
                                                                                                                                                             Patient Name: Valerie Cooper MRN: [id number] DOB: December 02, 1933 Referring Physician: [name] (Profile Not Attached) Date of Service: 08/07/2021 [facility], [place]                                                        End Of Treatment Note  Diagnoses: D05.11-Intraductal carcinoma in situ of right breast  Cancer Staging:  Cancer Staging  Malignant neoplasm of upper-inner quadrant of right breast in female, estrogen receptor positive ([place]) Staging form: Breast, AJCC 8th Edition - Clinical stage from 04/29/2021: Stage 0 (cTis (DCIS), cN0, cM0, ER+, PR+) - Signed by Gardenia Phlegm, NP on 05/06/2021 Stage prefix: Initial diagnosis   S/p lumpectomy: Stage 0 (cTis (DCIS), cN0, cM0) Right Breast UIQ, Intermediate grade ductal carcinoma in-situ, ER+ / PR+ / Her2 not assessed  Intent: Curative  Radiation Treatment Dates: 07/20/2021 through 08/07/2021 Site Technique Total Dose (Gy) Dose per Fx (Gy) Completed Fx Beam Energies  Breast, Right: Breast_R 3D 40.05/40.05 2.67 15/15 6XFFF   Narrative: The patient tolerated radiation therapy relatively well.   Plan: The patient will follow-up with radiation oncology in 3mo and/or prn . -----------------------------------  Eppie Gibson, MD

## 2021-09-29 ENCOUNTER — Encounter (HOSPITAL_BASED_OUTPATIENT_CLINIC_OR_DEPARTMENT_OTHER): Payer: Self-pay

## 2021-09-29 ENCOUNTER — Other Ambulatory Visit: Payer: Self-pay

## 2021-09-29 ENCOUNTER — Emergency Department (HOSPITAL_BASED_OUTPATIENT_CLINIC_OR_DEPARTMENT_OTHER): Payer: Medicare Other

## 2021-09-29 ENCOUNTER — Inpatient Hospital Stay (HOSPITAL_BASED_OUTPATIENT_CLINIC_OR_DEPARTMENT_OTHER)
Admission: EM | Admit: 2021-09-29 | Discharge: 2021-10-03 | DRG: 445 | Disposition: A | Discharge status: Home / Self Care | Payer: Medicare Other | Attending: Internal Medicine | Admitting: Internal Medicine

## 2021-09-29 DIAGNOSIS — Z923 Personal history of irradiation: Secondary | ICD-10-CM

## 2021-09-29 DIAGNOSIS — K8309 Other cholangitis: Secondary | ICD-10-CM | POA: Diagnosis not present

## 2021-09-29 DIAGNOSIS — E785 Hyperlipidemia, unspecified: Secondary | ICD-10-CM | POA: Diagnosis not present

## 2021-09-29 DIAGNOSIS — K8020 Calculus of gallbladder without cholecystitis without obstruction: Secondary | ICD-10-CM | POA: Diagnosis present

## 2021-09-29 DIAGNOSIS — I10 Essential (primary) hypertension: Secondary | ICD-10-CM | POA: Diagnosis not present

## 2021-09-29 DIAGNOSIS — M810 Age-related osteoporosis without current pathological fracture: Secondary | ICD-10-CM | POA: Diagnosis present

## 2021-09-29 DIAGNOSIS — K8070 Calculus of gallbladder and bile duct without cholecystitis without obstruction: Secondary | ICD-10-CM | POA: Diagnosis not present

## 2021-09-29 DIAGNOSIS — R17 Unspecified jaundice: Secondary | ICD-10-CM | POA: Diagnosis not present

## 2021-09-29 DIAGNOSIS — N1831 Chronic kidney disease, stage 3a: Secondary | ICD-10-CM | POA: Diagnosis not present

## 2021-09-29 DIAGNOSIS — Z8711 Personal history of peptic ulcer disease: Secondary | ICD-10-CM

## 2021-09-29 DIAGNOSIS — Z170 Estrogen receptor positive status [ER+]: Secondary | ICD-10-CM

## 2021-09-29 DIAGNOSIS — K279 Peptic ulcer, site unspecified, unspecified as acute or chronic, without hemorrhage or perforation: Secondary | ICD-10-CM | POA: Diagnosis not present

## 2021-09-29 DIAGNOSIS — N179 Acute kidney failure, unspecified: Secondary | ICD-10-CM | POA: Diagnosis not present

## 2021-09-29 DIAGNOSIS — C50211 Malignant neoplasm of upper-inner quadrant of right female breast: Secondary | ICD-10-CM | POA: Diagnosis present

## 2021-09-29 DIAGNOSIS — Z79810 Long term (current) use of selective estrogen receptor modulators (SERMs): Secondary | ICD-10-CM | POA: Diagnosis not present

## 2021-09-29 DIAGNOSIS — I129 Hypertensive chronic kidney disease with stage 1 through stage 4 chronic kidney disease, or unspecified chronic kidney disease: Secondary | ICD-10-CM | POA: Diagnosis present

## 2021-09-29 DIAGNOSIS — Z90710 Acquired absence of both cervix and uterus: Secondary | ICD-10-CM

## 2021-09-29 DIAGNOSIS — C50212 Malignant neoplasm of upper-inner quadrant of left female breast: Secondary | ICD-10-CM | POA: Diagnosis not present

## 2021-09-29 DIAGNOSIS — R109 Unspecified abdominal pain: Secondary | ICD-10-CM | POA: Diagnosis not present

## 2021-09-29 DIAGNOSIS — R531 Weakness: Secondary | ICD-10-CM | POA: Diagnosis not present

## 2021-09-29 DIAGNOSIS — D72829 Elevated white blood cell count, unspecified: Secondary | ICD-10-CM

## 2021-09-29 DIAGNOSIS — Z823 Family history of stroke: Secondary | ICD-10-CM

## 2021-09-29 DIAGNOSIS — M1990 Unspecified osteoarthritis, unspecified site: Secondary | ICD-10-CM | POA: Diagnosis not present

## 2021-09-29 DIAGNOSIS — B9620 Unspecified Escherichia coli [E. coli] as the cause of diseases classified elsewhere: Secondary | ICD-10-CM | POA: Diagnosis not present

## 2021-09-29 DIAGNOSIS — K8050 Calculus of bile duct without cholangitis or cholecystitis without obstruction: Principal | ICD-10-CM

## 2021-09-29 DIAGNOSIS — K219 Gastro-esophageal reflux disease without esophagitis: Secondary | ICD-10-CM | POA: Diagnosis not present

## 2021-09-29 DIAGNOSIS — I700 Atherosclerosis of aorta: Secondary | ICD-10-CM | POA: Diagnosis not present

## 2021-09-29 DIAGNOSIS — K529 Noninfective gastroenteritis and colitis, unspecified: Secondary | ICD-10-CM | POA: Diagnosis not present

## 2021-09-29 DIAGNOSIS — K8030 Calculus of bile duct with cholangitis, unspecified, without obstruction: Secondary | ICD-10-CM | POA: Diagnosis not present

## 2021-09-29 DIAGNOSIS — R748 Abnormal levels of other serum enzymes: Secondary | ICD-10-CM | POA: Diagnosis not present

## 2021-09-29 DIAGNOSIS — Z79899 Other long term (current) drug therapy: Secondary | ICD-10-CM | POA: Diagnosis not present

## 2021-09-29 DIAGNOSIS — R7881 Bacteremia: Secondary | ICD-10-CM | POA: Diagnosis not present

## 2021-09-29 DIAGNOSIS — C50919 Malignant neoplasm of unspecified site of unspecified female breast: Secondary | ICD-10-CM | POA: Diagnosis not present

## 2021-09-29 DIAGNOSIS — E876 Hypokalemia: Secondary | ICD-10-CM | POA: Diagnosis present

## 2021-09-29 DIAGNOSIS — Z9049 Acquired absence of other specified parts of digestive tract: Secondary | ICD-10-CM

## 2021-09-29 DIAGNOSIS — K838 Other specified diseases of biliary tract: Secondary | ICD-10-CM | POA: Diagnosis not present

## 2021-09-29 LAB — URINALYSIS, ROUTINE W REFLEX MICROSCOPIC
Glucose, UA: NEGATIVE mg/dL
Ketones, ur: NEGATIVE mg/dL
Nitrite: NEGATIVE
Protein, ur: 30 mg/dL — AB
Specific Gravity, Urine: 1.024 (ref 1.005–1.030)
pH: 5.5 (ref 5.0–8.0)

## 2021-09-29 LAB — DIFFERENTIAL
Abs Immature Granulocytes: 0.55 10*3/uL — ABNORMAL HIGH (ref 0.00–0.07)
Basophils Absolute: 0.1 10*3/uL (ref 0.0–0.1)
Basophils Relative: 0 %
Eosinophils Absolute: 0 10*3/uL (ref 0.0–0.5)
Eosinophils Relative: 0 %
Immature Granulocytes: 2 %
Lymphocytes Relative: 2 %
Lymphs Abs: 0.6 10*3/uL — ABNORMAL LOW (ref 0.7–4.0)
Monocytes Absolute: 0.5 10*3/uL (ref 0.1–1.0)
Monocytes Relative: 1 %
Neutro Abs: 29.3 10*3/uL — ABNORMAL HIGH (ref 1.7–7.7)
Neutrophils Relative %: 95 %

## 2021-09-29 LAB — CBC
HCT: 38.7 % (ref 36.0–46.0)
Hemoglobin: 13 g/dL (ref 12.0–15.0)
MCH: 31.6 pg (ref 26.0–34.0)
MCHC: 33.6 g/dL (ref 30.0–36.0)
MCV: 93.9 fL (ref 80.0–100.0)
Platelets: 116 10*3/uL — ABNORMAL LOW (ref 150–400)
RBC: 4.12 MIL/uL (ref 3.87–5.11)
RDW: 13.8 % (ref 11.5–15.5)
WBC: 32.1 10*3/uL — ABNORMAL HIGH (ref 4.0–10.5)
nRBC: 0 % (ref 0.0–0.2)

## 2021-09-29 LAB — COMPREHENSIVE METABOLIC PANEL
ALT: 170 U/L — ABNORMAL HIGH (ref 0–44)
AST: 124 U/L — ABNORMAL HIGH (ref 15–41)
Albumin: 4 g/dL (ref 3.5–5.0)
Alkaline Phosphatase: 164 U/L — ABNORMAL HIGH (ref 38–126)
Anion gap: 13 (ref 5–15)
BUN: 34 mg/dL — ABNORMAL HIGH (ref 8–23)
CO2: 25 mmol/L (ref 22–32)
Calcium: 9.8 mg/dL (ref 8.9–10.3)
Chloride: 94 mmol/L — ABNORMAL LOW (ref 98–111)
Creatinine, Ser: 1.56 mg/dL — ABNORMAL HIGH (ref 0.44–1.00)
GFR, Estimated: 32 mL/min — ABNORMAL LOW (ref >60)
Glucose, Bld: 85 mg/dL (ref 70–99)
Potassium: 4.3 mmol/L (ref 3.5–5.1)
Sodium: 132 mmol/L — ABNORMAL LOW (ref 135–145)
Total Bilirubin: 5.3 mg/dL — ABNORMAL HIGH (ref 0.3–1.2)
Total Protein: 8.3 g/dL — ABNORMAL HIGH (ref 6.5–8.1)

## 2021-09-29 LAB — LACTIC ACID, PLASMA: Lactic Acid, Venous: 1.2 mmol/L (ref 0.5–1.9)

## 2021-09-29 LAB — LIPASE, BLOOD: Lipase: <10 U/L — ABNORMAL LOW (ref 11–51)

## 2021-09-29 MED ORDER — VANCOMYCIN HCL IN DEXTROSE 1-5 GM/200ML-% IV SOLN: 1000.0000 mg | INTRAVENOUS | Status: DC

## 2021-09-29 MED ORDER — SODIUM CHLORIDE 0.9 % IV BOLUS
1000.0000 mL | Freq: Once | INTRAVENOUS | Status: AC
Start: 2021-09-29 — End: 2021-09-29
  Administered 2021-09-29: 1000 mL via INTRAVENOUS

## 2021-09-29 MED ORDER — SODIUM CHLORIDE 0.9 % IV BOLUS: 500.0000 mL | Freq: Once | INTRAVENOUS | Status: DC

## 2021-09-29 MED ORDER — VANCOMYCIN HCL IN DEXTROSE 1-5 GM/200ML-% IV SOLN
1000.0000 mg | Freq: Once | INTRAVENOUS | Status: AC
  Administered 2021-09-29: 1000 mg via INTRAVENOUS
  Filled 2021-09-29: qty 200

## 2021-09-29 MED ORDER — PIPERACILLIN-TAZOBACTAM 3.375 G IVPB 30 MIN
3.3750 g | Freq: Once | INTRAVENOUS | Status: AC
Start: 2021-09-29 — End: 2021-09-29
  Administered 2021-09-29: 3.375 g via INTRAVENOUS
  Filled 2021-09-29: qty 50

## 2021-09-29 MED ORDER — IOHEXOL 300 MG/ML  SOLN
100.0000 mL | Freq: Once | INTRAMUSCULAR | Status: AC | PRN
  Administered 2021-09-29: 60 mL via INTRAVENOUS

## 2021-09-29 MED ORDER — PIPERACILLIN-TAZOBACTAM 3.375 G IVPB
3.3750 g | Freq: Three times a day (TID) | INTRAVENOUS | Status: DC
  Administered 2021-09-30 – 2021-10-03 (×10): 3.375 g via INTRAVENOUS
  Filled 2021-09-29 (×12): qty 50

## 2021-09-29 NOTE — Progress Notes (Signed)
Pharmacy Antibiotic Note  Valerie Cooper is a 88 y.o. female for which pharmacy has been consulted for vancomycin and zosyn dosing for sepsis.  Patient with a history of breast cancer s/p lumpectomy. Patient presenting with vomiting and diarrhea.  SCr 1.56 - above baseline WBC 32.1; LA 1.2; T 97.9 F; HR 110; RR 16  Plan: Zosyn 3.375g IV q8h (4 hour infusion) Vancomycin 1000 mg q48hr (eAUC 506) unless change in renal function Trend WBC, Fever, Renal function, & Clinical course F/u cultures, clinical course, WBC, fever De-escalate when able Levels at steady state  Height: '5\' 1"'$  (154.9 cm) Weight: 66.5 kg (146 lb 9.7 oz) IBW/kg (Calculated) : 47.8  Temp (24hrs), Avg:97.9 F (36.6 C), Min:97.9 F (36.6 C), Max:97.9 F (36.6 C)  Recent Labs  Lab 09/29/21 1550  WBC 32.1*  CREATININE 1.56*    Estimated Creatinine Clearance: 22.6 mL/min (A) (by C-G formula based on SCr of 1.56 mg/dL (H)).    Allergies  Allergen Reactions   Antivert [Meclizine Hcl]     Other reaction(s): hallucinations   Other Other (See Comments)    Anesthesia medications cause "deep sleep"   Antimicrobials this admission: vancomycin 6/13 >>  zosyn 6/13 >>   Microbiology results: Pending  Thank you for allowing pharmacy to be a part of this patient's care.  Lorelei Pont, PharmD, BCPS 09/29/2021 6:04 PM ED Clinical Pharmacist -  [phone]

## 2021-09-29 NOTE — ED Triage Notes (Signed)
Patient here POV from Home.  Endorses having N/V/D since Sunday PM. Symptoms have continued Since. Symptoms have actually improved today since Sunday.  Seen by PCP today and instructed to seek ED Evaluation due to Leukocytosis and Hypokalemia. No Known Fevers. Intermittent ABD Pain.  NAD Noted during Triage. A&Ox4. GCS 15. Ambulatory.

## 2021-09-29 NOTE — ED Provider Notes (Signed)
[facility] EMERGENCY DEPT Provider Note   CSN: [id number] Arrival date & time: 09/29/21  1523     History  Chief Complaint  Patient presents with   Emesis    Valerie Cooper is a 88 y.o. female.  Patient with history of breast cancer status postlumpectomy with clean margins, presents to ER chief complaint of vomiting and diarrhea 2 days ago since now resolved.  She actually went to see her primary care doctor today had labs done.  She was told her white count was 30 and to go to the ER.  She states she had abdominal pain yesterday but denies any abdominal pain now.  Denies any blood in the vomit or diarrhea.  She states most of the symptoms have resolved and she no longer feels like she has to have diarrhea or any vomiting.  Denies any abdominal pain currently.  No reports of fevers or cough.       Home Medications Prior to Admission medications   Medication Sig Start Date End Date Taking? Authorizing Provider  acetaminophen (TYLENOL) 325 MG tablet Take 2 tablets (650 mg total) by mouth every 6 (six) hours. Patient taking differently: Take 325 mg by mouth every 4 (four) hours as needed for moderate pain, fever or headache. 02/10/16   Geradine Girt, DO  amLODipine (NORVASC) 5 MG tablet Take by mouth 2 (two) times daily. 08/28/20   [provider]  atorvastatin (LIPITOR) 20 MG tablet Take 20 mg by mouth daily.    [provider]  cholecalciferol (VITAMIN D3) 25 MCG (1000 UNIT) tablet Take 1,000 Units by mouth daily.    [provider]  ketorolac (ACULAR) 0.5 % ophthalmic solution Place 1 drop into the right eye 3 (three) times daily. 11/12/20   [provider]  oxyCODONE (OXY IR/ROXICODONE) 5 MG immediate release tablet Take 0.5-1 tablets (2.5-5 mg total) by mouth every 6 (six) hours as needed for severe pain. 06/17/21   Stark Klein, MD  pantoprazole (PROTONIX) 40 MG tablet Take 40 mg by mouth daily.    [provider]   prednisoLONE acetate (PRED FORTE) 1 % ophthalmic suspension Place 1 drop into the right eye 3 (three) times daily. 11/13/20   [provider]  tamoxifen (NOLVADEX) 20 MG tablet Take 1 tablet (20 mg total) by mouth daily. 09/22/21   Benay Pike, MD  valsartan (DIOVAN) 80 MG tablet Take 80 mg by mouth daily. 03/07/21   [provider]      Allergies    Antivert [meclizine hcl] and Other    Review of Systems   Review of Systems  Constitutional:  Negative for fever.  HENT:  Negative for ear pain.   Eyes:  Negative for pain.  Respiratory:  Negative for cough.   Cardiovascular:  Negative for chest pain.  Genitourinary:  Negative for flank pain.  Musculoskeletal:  Negative for back pain.  Skin:  Negative for rash.  Neurological:  Negative for headaches.    Physical Exam Updated Vital Signs BP 114/64   Pulse 94   Temp 97.9 F (36.6 C) (Oral)   Resp 15   Ht '5\' 1"'$  (1.549 m)   Wt 66.5 kg   SpO2 96%   BMI 27.70 kg/m  Physical Exam Constitutional:      General: She is not in acute distress.    Appearance: Normal appearance.  HENT:     Head: Normocephalic.     Nose: Nose normal.  Eyes:     Extraocular  Movements: Extraocular movements intact.  Cardiovascular:     Rate and Rhythm: Normal rate.  Pulmonary:     Effort: Pulmonary effort is normal.  Abdominal:     Comments: Mild generalized abdominal discomfort on exam no localized tenderness no guarding or rebound.  Musculoskeletal:        General: Normal range of motion.     Cervical back: Normal range of motion.  Neurological:     General: No focal deficit present.     Mental Status: She is alert. Mental status is at baseline.     ED Results / Procedures / Treatments   Labs (all labs ordered are listed, but only abnormal results are displayed) Labs Reviewed  LIPASE, BLOOD - Abnormal; Notable for the following components:      Result Value   Lipase <10 (*)    All other components within normal limits   COMPREHENSIVE METABOLIC PANEL - Abnormal; Notable for the following components:   Sodium 132 (*)    Chloride 94 (*)    BUN 34 (*)    Creatinine, Ser 1.56 (*)    Total Protein 8.3 (*)    AST 124 (*)    ALT 170 (*)    Alkaline Phosphatase 164 (*)    Total Bilirubin 5.3 (*)    GFR, Estimated 32 (*)    All other components within normal limits  CBC - Abnormal; Notable for the following components:   WBC 32.1 (*)    Platelets 116 (*)    All other components within normal limits  URINALYSIS, ROUTINE W REFLEX MICROSCOPIC - Abnormal; Notable for the following components:   APPearance HAZY (*)    Hgb urine dipstick SMALL (*)    Bilirubin Urine MODERATE (*)    Protein, ur 30 (*)    Leukocytes,Ua MODERATE (*)    Bacteria, UA MANY (*)    All other components within normal limits  DIFFERENTIAL - Abnormal; Notable for the following components:   Neutro Abs 29.3 (*)    Lymphs Abs 0.6 (*)    Abs Immature Granulocytes 0.55 (*)    All other components within normal limits  CULTURE, BLOOD (ROUTINE X 2)  CULTURE, BLOOD (ROUTINE X 2)  LACTIC ACID, PLASMA  CBC WITH DIFFERENTIAL/PLATELET  LACTIC ACID, PLASMA    EKG None  Radiology CT Abdomen Pelvis W Contrast  Result Date: 09/29/2021 CLINICAL DATA:  Abdominal pain EXAM: CT ABDOMEN AND PELVIS WITH CONTRAST TECHNIQUE: Multidetector CT imaging of the abdomen and pelvis was performed using the standard protocol following bolus administration of intravenous contrast. RADIATION DOSE REDUCTION: This exam was performed according to the departmental dose-optimization program which includes automated exposure control, adjustment of the mA and/or kV according to patient size and/or use of iterative reconstruction technique. CONTRAST:  65m OMNIPAQUE IOHEXOL 300 MG/ML  SOLN COMPARISON:  CT abdomen and pelvis dated December 21, 2018 FINDINGS: Lower chest: No acute abnormality. Hepatobiliary: No suspicious liver lesions. Gallbladder is surgically absent.  Dilated intrahepatic bile ducts and severe dilation of the common bile duct, measuring up to 1.7 cm. Filling defect of the distal common bile duct measuring up to 1.4 cm and mild wall thickening. Pancreas: Unremarkable. No pancreatic ductal dilatation or surrounding inflammatory changes. Spleen: Normal in size without focal abnormality. Adrenals/Urinary Tract: Bilateral adrenal glands are unremarkable. No hydronephrosis or nephrolithiasis. Bladder is unremarkable. Stomach/Bowel: Small hiatal hernia. Wall thickening of the antrum of the stomach. Diverticulosis. No evidence of obstruction. No bowel wall thickening. Vascular/Lymphatic: Aortic atherosclerosis. No enlarged  abdominal or pelvic lymph nodes. Reproductive: No adnexal masses. Other: Small fat containing umbilical hernia. Surgical clips noted in the right breast. No abdominopelvic ascites. Musculoskeletal: Moderate wedge compression deformity of the T12 vertebral body, unchanged when compared with prior exam. No aggressive appearing osseous lesions. IMPRESSION: 1. Severe biliary ductal dilation with 1.4 cm filling defect of the distal common bile duct, findings are compatible with choledocholithiasis. Wall thickening of the common bile duct is concerning for superimposed cholangitis. 2. Wall thickening of the antrum of the stomach, findings can be seen in the setting of gastritis. Endoscopy could be performed for further evaluation. 3.  Aortic Atherosclerosis (ICD10-I70.0). Electronically Signed   By: Yetta Glassman M.D.   On: 09/29/2021 17:50   DG Chest Portable 1 View  Result Date: 09/29/2021 CLINICAL DATA:  Leukocytosis EXAM: PORTABLE CHEST 1 VIEW COMPARISON:  11/18/2020 FINDINGS: Midline trachea. Mild cardiomegaly. Mediastinal contours otherwise within normal limits. No pleural effusion or pneumothorax. Clear lungs. Degenerative changes of the left greater than right shoulders. IMPRESSION: Mild cardiomegaly, without acute disease. Electronically  Signed   By: Abigail Miyamoto M.D.   On: 09/29/2021 17:45   US Abdomen Limited RUQ (LIVER/GB)  Result Date: 09/29/2021 CLINICAL DATA:  Abdominal pain EXAM: ULTRASOUND ABDOMEN LIMITED RIGHT UPPER QUADRANT COMPARISON:  Same day CT of the abdomen and pelvis dated September 29, 2020 FINDINGS: Gallbladder: Surgically absent. Common bile duct: Diameter: 17.3 Intrahepatic biliary ductal dilation. Liver: No focal lesion identified. Within normal limits in parenchymal echogenicity. Portal vein is patent on color Doppler imaging with normal direction of blood flow towards the liver. Other: None. IMPRESSION: Markedly dilated common bile duct and dilated intrahepatic bile ducts. See same day CT of the abdomen and pelvis for further evaluation. Electronically Signed   By: Yetta Glassman M.D.   On: 09/29/2021 17:39    Procedures Procedures    Medications Ordered in ED Medications  vancomycin (VANCOCIN) IVPB 1000 mg/200 mL premix (has no administration in time range)  piperacillin-tazobactam (ZOSYN) IVPB 3.375 g (3.375 g Intravenous New Bag/Given 09/29/21 1834)    Followed by  piperacillin-tazobactam (ZOSYN) IVPB 3.375 g (has no administration in time range)  sodium chloride 0.9 % bolus 1,000 mL (0 mLs Intravenous Stopped 09/29/21 1654)  iohexol (OMNIPAQUE) 300 MG/ML solution 100 mL (60 mLs Intravenous Contrast Given 09/29/21 1726)    ED Course/ Medical Decision Making/ A&P                           Medical Decision Making Amount and/or Complexity of Data Reviewed Labs: ordered. Radiology: ordered.  Risk Prescription drug management.   Cardiac monitoring showing sinus rhythm.  Review of record shows office visit June for 2023 for malignant neoplasm.  History obtained from family at bedside.  Labs were sent were sent, white count elevated 32 differential shows increased neutrophils.  Liver enzymes elevated T. bili of 5.  CT abdomen pelvis pursued concerning for choledocholithiasis, possible cholangitis.   Given this finding per radiology, blood cultures and lactic acid sent and patient started on Zosyn and vancomycin.  Clinically she is well-appearing without fevers normal vital signs.  Case discussed with on-call oncology, they feel this is not likely to be related to her cancer history but suspect another etiology.  Case discussed with medicine for admission.  Case discussed with on-call gastroenterology from [facility], they recommend admission to [facility] for possible MRCP P in the morning with Dr. Therisa Doyne.  Agree with continuing Zosyn.  Final Clinical Impression(s) / ED Diagnoses Final diagnoses:  Choledocholithiasis  Leukocytosis, unspecified type    Rx / DC Orders ED Discharge Orders     None         Luna Fuse, MD 09/29/21 1927

## 2021-09-29 NOTE — ED Notes (Signed)
Pt given chicken noodle soup and saltine crackers.

## 2021-09-30 DIAGNOSIS — C50212 Malignant neoplasm of upper-inner quadrant of left female breast: Secondary | ICD-10-CM | POA: Diagnosis not present

## 2021-09-30 DIAGNOSIS — K8070 Calculus of gallbladder and bile duct without cholecystitis without obstruction: Secondary | ICD-10-CM | POA: Diagnosis present

## 2021-09-30 DIAGNOSIS — Z170 Estrogen receptor positive status [ER+]: Secondary | ICD-10-CM

## 2021-09-30 DIAGNOSIS — Z9049 Acquired absence of other specified parts of digestive tract: Secondary | ICD-10-CM | POA: Diagnosis not present

## 2021-09-30 DIAGNOSIS — Z823 Family history of stroke: Secondary | ICD-10-CM | POA: Diagnosis not present

## 2021-09-30 DIAGNOSIS — K838 Other specified diseases of biliary tract: Secondary | ICD-10-CM | POA: Diagnosis not present

## 2021-09-30 DIAGNOSIS — E876 Hypokalemia: Secondary | ICD-10-CM | POA: Diagnosis present

## 2021-09-30 DIAGNOSIS — N179 Acute kidney failure, unspecified: Secondary | ICD-10-CM | POA: Diagnosis present

## 2021-09-30 DIAGNOSIS — I129 Hypertensive chronic kidney disease with stage 1 through stage 4 chronic kidney disease, or unspecified chronic kidney disease: Secondary | ICD-10-CM | POA: Diagnosis present

## 2021-09-30 DIAGNOSIS — K8309 Other cholangitis: Secondary | ICD-10-CM | POA: Diagnosis present

## 2021-09-30 DIAGNOSIS — K8020 Calculus of gallbladder without cholecystitis without obstruction: Secondary | ICD-10-CM

## 2021-09-30 DIAGNOSIS — K219 Gastro-esophageal reflux disease without esophagitis: Secondary | ICD-10-CM | POA: Diagnosis present

## 2021-09-30 DIAGNOSIS — Z90710 Acquired absence of both cervix and uterus: Secondary | ICD-10-CM | POA: Diagnosis not present

## 2021-09-30 DIAGNOSIS — Z79810 Long term (current) use of selective estrogen receptor modulators (SERMs): Secondary | ICD-10-CM | POA: Diagnosis not present

## 2021-09-30 DIAGNOSIS — C50211 Malignant neoplasm of upper-inner quadrant of right female breast: Secondary | ICD-10-CM | POA: Diagnosis present

## 2021-09-30 DIAGNOSIS — Z8711 Personal history of peptic ulcer disease: Secondary | ICD-10-CM | POA: Diagnosis not present

## 2021-09-30 DIAGNOSIS — E785 Hyperlipidemia, unspecified: Secondary | ICD-10-CM | POA: Diagnosis present

## 2021-09-30 DIAGNOSIS — K8030 Calculus of bile duct with cholangitis, unspecified, without obstruction: Secondary | ICD-10-CM | POA: Diagnosis present

## 2021-09-30 DIAGNOSIS — I10 Essential (primary) hypertension: Secondary | ICD-10-CM | POA: Diagnosis not present

## 2021-09-30 DIAGNOSIS — R7881 Bacteremia: Secondary | ICD-10-CM | POA: Diagnosis present

## 2021-09-30 DIAGNOSIS — K8050 Calculus of bile duct without cholangitis or cholecystitis without obstruction: Secondary | ICD-10-CM | POA: Diagnosis not present

## 2021-09-30 DIAGNOSIS — K279 Peptic ulcer, site unspecified, unspecified as acute or chronic, without hemorrhage or perforation: Secondary | ICD-10-CM

## 2021-09-30 DIAGNOSIS — M810 Age-related osteoporosis without current pathological fracture: Secondary | ICD-10-CM | POA: Diagnosis present

## 2021-09-30 DIAGNOSIS — Z79899 Other long term (current) drug therapy: Secondary | ICD-10-CM | POA: Diagnosis not present

## 2021-09-30 DIAGNOSIS — N1831 Chronic kidney disease, stage 3a: Secondary | ICD-10-CM | POA: Diagnosis present

## 2021-09-30 DIAGNOSIS — B9620 Unspecified Escherichia coli [E. coli] as the cause of diseases classified elsewhere: Secondary | ICD-10-CM | POA: Diagnosis present

## 2021-09-30 DIAGNOSIS — Z923 Personal history of irradiation: Secondary | ICD-10-CM | POA: Diagnosis not present

## 2021-09-30 DIAGNOSIS — M1990 Unspecified osteoarthritis, unspecified site: Secondary | ICD-10-CM | POA: Diagnosis not present

## 2021-09-30 LAB — COMPREHENSIVE METABOLIC PANEL
ALT: 103 U/L — ABNORMAL HIGH (ref 0–44)
AST: 49 U/L — ABNORMAL HIGH (ref 15–41)
Albumin: 2.9 g/dL — ABNORMAL LOW (ref 3.5–5.0)
Alkaline Phosphatase: 139 U/L — ABNORMAL HIGH (ref 38–126)
Anion gap: 12 (ref 5–15)
BUN: 26 mg/dL — ABNORMAL HIGH (ref 8–23)
CO2: 22 mmol/L (ref 22–32)
Calcium: 8.8 mg/dL — ABNORMAL LOW (ref 8.9–10.3)
Chloride: 102 mmol/L (ref 98–111)
Creatinine, Ser: 1.31 mg/dL — ABNORMAL HIGH (ref 0.44–1.00)
GFR, Estimated: 40 mL/min — ABNORMAL LOW (ref >60)
Glucose, Bld: 74 mg/dL (ref 70–99)
Potassium: 3.2 mmol/L — ABNORMAL LOW (ref 3.5–5.1)
Sodium: 136 mmol/L (ref 135–145)
Total Bilirubin: 2.2 mg/dL — ABNORMAL HIGH (ref 0.3–1.2)
Total Protein: 6.8 g/dL (ref 6.5–8.1)

## 2021-09-30 LAB — CBC WITH DIFFERENTIAL/PLATELET
Abs Immature Granulocytes: 0 10*3/uL (ref 0.00–0.07)
Basophils Absolute: 0 10*3/uL (ref 0.0–0.1)
Basophils Relative: 0 %
Eosinophils Absolute: 0 10*3/uL (ref 0.0–0.5)
Eosinophils Relative: 0 %
HCT: 35.9 % — ABNORMAL LOW (ref 36.0–46.0)
Hemoglobin: 12.4 g/dL (ref 12.0–15.0)
Lymphocytes Relative: 5 %
Lymphs Abs: 1.5 10*3/uL (ref 0.7–4.0)
MCH: 32.1 pg (ref 26.0–34.0)
MCHC: 34.5 g/dL (ref 30.0–36.0)
MCV: 93 fL (ref 80.0–100.0)
Monocytes Absolute: 0.3 10*3/uL (ref 0.1–1.0)
Monocytes Relative: 1 %
Neutro Abs: 27.7 10*3/uL — ABNORMAL HIGH (ref 1.7–7.7)
Neutrophils Relative %: 94 %
Platelets: 191 10*3/uL (ref 150–400)
RBC: 3.86 MIL/uL — ABNORMAL LOW (ref 3.87–5.11)
RDW: 13.5 % (ref 11.5–15.5)
WBC: 29.5 10*3/uL — ABNORMAL HIGH (ref 4.0–10.5)
nRBC: 0 % (ref 0.0–0.2)
nRBC: 0 /100 WBC

## 2021-09-30 MED ORDER — IRBESARTAN 75 MG PO TABS
37.5000 mg | ORAL_TABLET | Freq: Every day | ORAL | Status: DC
  Filled 2021-09-30 (×2): qty 0.5

## 2021-09-30 MED ORDER — PANTOPRAZOLE SODIUM 40 MG PO TBEC
40.0000 mg | DELAYED_RELEASE_TABLET | Freq: Every day | ORAL | Status: DC
  Administered 2021-09-30 – 2021-10-03 (×4): 40 mg via ORAL
  Filled 2021-09-30 (×4): qty 1

## 2021-09-30 MED ORDER — ORAL CARE MOUTH RINSE: 15.0000 mL | OROMUCOSAL | Status: DC | PRN

## 2021-09-30 MED ORDER — ONDANSETRON HCL 4 MG/2ML IJ SOLN: 4.0000 mg | Freq: Four times a day (QID) | INTRAMUSCULAR | Status: DC | PRN

## 2021-09-30 MED ORDER — TAMOXIFEN CITRATE 10 MG PO TABS
20.0000 mg | ORAL_TABLET | Freq: Every day | ORAL | Status: DC
  Filled 2021-09-30 (×4): qty 2

## 2021-09-30 MED ORDER — DOCUSATE SODIUM 100 MG PO CAPS
100.0000 mg | ORAL_CAPSULE | Freq: Two times a day (BID) | ORAL | Status: DC
  Administered 2021-09-30: 100 mg via ORAL
  Filled 2021-09-30 (×7): qty 1

## 2021-09-30 MED ORDER — AMLODIPINE BESYLATE 5 MG PO TABS
2.5000 mg | ORAL_TABLET | Freq: Two times a day (BID) | ORAL | Status: DC
  Administered 2021-10-01 – 2021-10-03 (×5): 2.5 mg via ORAL
  Filled 2021-09-30 (×7): qty 1

## 2021-09-30 MED ORDER — OXYCODONE HCL 5 MG PO TABS: 2.5000 mg | ORAL_TABLET | Freq: Four times a day (QID) | ORAL | Status: DC | PRN

## 2021-09-30 MED ORDER — MORPHINE SULFATE (PF) 2 MG/ML IV SOLN: 2.0000 mg | INTRAVENOUS | Status: DC | PRN

## 2021-09-30 MED ORDER — LACTATED RINGERS IV SOLN: INTRAVENOUS | Status: DC

## 2021-09-30 MED ORDER — ONDANSETRON HCL 4 MG PO TABS: 4.0000 mg | ORAL_TABLET | Freq: Four times a day (QID) | ORAL | Status: DC | PRN

## 2021-09-30 MED ORDER — VITAMIN D 25 MCG (1000 UNIT) PO TABS
1000.0000 [IU] | ORAL_TABLET | Freq: Every day | ORAL | Status: DC
  Administered 2021-09-30 – 2021-10-03 (×4): 1000 [IU] via ORAL
  Filled 2021-09-30 (×4): qty 1

## 2021-09-30 MED ORDER — HYDRALAZINE HCL 20 MG/ML IJ SOLN: 10.0000 mg | Freq: Four times a day (QID) | INTRAMUSCULAR | Status: DC | PRN

## 2021-09-30 MED ORDER — ATORVASTATIN CALCIUM 10 MG PO TABS
20.0000 mg | ORAL_TABLET | Freq: Every day | ORAL | Status: DC
  Filled 2021-09-30: qty 2

## 2021-09-30 NOTE — H&P (View-Only) (Signed)
Referring Provider: [facility] Primary Care Physician:  [name], MD Primary Gastroenterologist:  [facility]  Reason for Consultation: Cholangitis  HPI: Valerie Cooper is a 88 y.o. female with past medical history of right breast cancer, GERD, hypertension, hyperlipidemia, osteoporosis presented to the ED on 6/13 with abdominal pain.   Afternoon 2 hours after eating a fatty lunch she began to have epigastric pain.  Later Sunday evening she began to have nausea vomiting, reports feeling febrile.  Starting Monday morning she continued to have nausea vomiting and began to have diarrhea. Seen with Dr. Deforest Hoyles at [facility] on 09/29/2021, labs significant for T. bili 5.3, ALP 189, AST 122, ALT 181, white blood cells 33.5, hemoglobin 12.0.  Patient was contacted with lab results and recommended to present to the ED.  Today patient reports she no longer has abdominal pain, no longer feels febrile or has nausea and vomiting.  Bowel movements have returned to normal.  Patient's vital signs are stable.  Patient denies melena, hematochezia, constipation. She has lost about 10 pounds in the last 6 months.  Patient is not on any blood thinning medications, denies NSAID use, denies alcohol use, denies smoking.  Patient recently started on tamoxifen Thursday. History of peptic ulcer disease in the 40s Colonoscopy last in 11/2003 notable for diverticulosis with Dr. Sammuel Cooper No previous EGD Previous cholecystectomy in 2014  Past Medical History:  Diagnosis Date   Cancer [facility]) 04/2021   right breast DCIS   Cataract    Complication of anesthesia, + pseudocholinesterase deficiency    It takes awhile for me to wake up "   DJD (degenerative joint disease)    Family history of anesthesia complication    GERD (gastroesophageal reflux disease)    H/O colonoscopy 04/20/2003   Hyperlipidemia    Hypertension    Osteoporosis    Shingles 04/20/1999   right leg   Ulcer 1980s   PUD ?due to ASA intake    Past  Surgical History:  Procedure Laterality Date   ABDOMINAL HYSTERECTOMY  1975   BREAST LUMPECTOMY WITH RADIOACTIVE SEED LOCALIZATION Right 06/17/2021   Procedure: RIGHT BREAST SEED LOCALIZED LUMPECTOMY;  Surgeon: Stark Klein, MD;  Location: [place];  Service: General;  Laterality: Right;   CHOLECYSTECTOMY  05/29/2012   Dr Lucia Gaskins   CHOLECYSTECTOMY N/A 05/29/2012   Procedure: LAPAROSCOPIC CHOLECYSTECTOMY WITH INTRAOPERATIVE CHOLANGIOGRAM;  Surgeon: Shann Medal, MD;  Location: [place];  Service: General;  Laterality: N/A;   FRACTURE SURGERY     L arm   ROTATOR CUFF REPAIR Bilateral 2005   4 surgeries    Prior to Admission medications   Medication Sig Start Date End Date Taking? Authorizing Provider  acetaminophen (TYLENOL) 325 MG tablet Take 2 tablets (650 mg total) by mouth every 6 (six) hours. Patient taking differently: Take 325 mg by mouth every 4 (four) hours as needed for moderate pain, fever or headache. 02/10/16  Yes Vann, Jessica U, DO  amLODipine (NORVASC) 5 MG tablet Take by mouth 2 (two) times daily. 08/28/20  Yes [provider]  atorvastatin (LIPITOR) 20 MG tablet Take 20 mg by mouth daily.   Yes [provider]  cholecalciferol (VITAMIN D3) 25 MCG (1000 UNIT) tablet Take 1,000 Units by mouth daily.   Yes [provider]  ketorolac (ACULAR) 0.5 % ophthalmic solution Place 1 drop into the right eye 3 (three) times daily. 11/12/20  Yes [provider]  pantoprazole (PROTONIX) 40 MG tablet Take 40 mg by mouth daily.  Yes [provider]  prednisoLONE acetate (PRED FORTE) 1 % ophthalmic suspension Place 1 drop into the right eye 3 (three) times daily. 11/13/20  Yes [provider]  tamoxifen (NOLVADEX) 20 MG tablet Take 1 tablet (20 mg total) by mouth daily. 09/22/21  Yes Benay Pike, MD  valsartan (DIOVAN) 80 MG tablet Take 80 mg by mouth daily. 03/07/21  Yes [provider]  oxyCODONE (OXY  IR/ROXICODONE) 5 MG immediate release tablet Take 0.5-1 tablets (2.5-5 mg total) by mouth every 6 (six) hours as needed for severe pain. Patient not taking: Reported on 09/30/2021 06/17/21   Stark Klein, MD    Scheduled Meds: Continuous Infusions:  piperacillin-tazobactam (ZOSYN)  IV 3.375 g (09/30/21 1149)   [START ON 10/01/2021] vancomycin     PRN Meds:.  Allergies as of 09/29/2021 - Review Complete 09/29/2021  Allergen Reaction Noted   Antivert [meclizine hcl]  03/30/2021   Other Other (See Comments) 09/10/2014    Family History  Problem Relation Age of Onset   Stroke Mother    Stroke Father     Social History   Socioeconomic History   Marital status: Widowed    Spouse name: Not on file   Number of children: Not on file   Years of education: Not on file   Highest education level: Not on file  Occupational History   Not on file  Tobacco Use   Smoking status: Never   Smokeless tobacco: Never  Vaping Use   Vaping Use: Never used  Substance and Sexual Activity   Alcohol use: No   Drug use: No   Sexual activity: Not Currently    Birth control/protection: Surgical  Other Topics Concern   Not on file  Social History Narrative   Not on file   Social Determinants of Health   Financial Resource Strain: Not on file  Food Insecurity: Not on file  Transportation Needs: Not on file  Physical Activity: Not on file  Stress: Not on file  Social Connections: Not on file  Intimate Partner Violence: Not on file    Review of Systems: Review of Systems  Constitutional:  Positive for fever. Negative for chills and malaise/fatigue.  HENT:  Negative for hearing loss and tinnitus.   Eyes:  Negative for blurred vision and double vision.  Respiratory:  Negative for cough and hemoptysis.   Cardiovascular:  Negative for chest pain and palpitations.  Gastrointestinal:  Positive for abdominal pain, diarrhea, nausea and vomiting. Negative for blood in stool, constipation, heartburn  and melena.  Genitourinary:  Negative for dysuria and urgency.  Musculoskeletal:  Negative for myalgias and neck pain.  Skin:  Negative for itching and rash.  Neurological:  Positive for weakness. Negative for dizziness and headaches.  Endo/Heme/Allergies:  Negative for environmental allergies. Does not bruise/bleed easily.  Psychiatric/Behavioral:  Negative for depression, substance abuse and suicidal ideas.      Physical Exam:Physical Exam Constitutional:      General: She is not in acute distress.    Appearance: Normal appearance. She is normal weight.  HENT:     Head: Normocephalic and atraumatic.     Right Ear: External ear normal.     Left Ear: External ear normal.     Nose: Nose normal.     Mouth/Throat:     Mouth: Mucous membranes are moist.  Eyes:     General: Scleral icterus present.     Pupils: Pupils are equal, round, and reactive to light.  Cardiovascular:  Rate and Rhythm: Normal rate and regular rhythm.     Pulses: Normal pulses.     Heart sounds: Normal heart sounds.  Pulmonary:     Effort: Pulmonary effort is normal.     Breath sounds: Normal breath sounds.  Abdominal:     General: Abdomen is flat. Bowel sounds are normal. There is no distension.     Palpations: Abdomen is soft. There is no mass.     Tenderness: There is no abdominal tenderness. There is no guarding or rebound.     Hernia: No hernia is present.  Musculoskeletal:        General: No swelling. Normal range of motion.     Cervical back: Normal range of motion and neck supple.  Skin:    General: Skin is warm and dry.     Coloration: Skin is not jaundiced.  Neurological:     General: No focal deficit present.     Mental Status: She is alert and oriented to person, place, and time. Mental status is at baseline.  Psychiatric:        Mood and Affect: Mood normal.        Behavior: Behavior normal.     Vital signs: Vitals:   09/30/21 1100 09/30/21 1230  BP: (!) 103/57 128/85  Pulse: 82    Resp: 18 17  Temp:    SpO2: 94%         GI:  Lab Results: Recent Labs    09/29/21 1550  WBC 32.1*  HGB 13.0  HCT 38.7  PLT 116*   BMET Recent Labs    09/29/21 1550  NA 132*  K 4.3  CL 94*  CO2 25  GLUCOSE 85  BUN 34*  CREATININE 1.56*  CALCIUM 9.8   LFT Recent Labs    09/29/21 1550  PROT 8.3*  ALBUMIN 4.0  AST 124*  ALT 170*  ALKPHOS 164*  BILITOT 5.3*   PT/INR No results for input(s): "LABPROT", "INR" in the last 72 hours.   Studies/Results: CT Abdomen Pelvis W Contrast  Result Date: 09/29/2021 CLINICAL DATA:  Abdominal pain EXAM: CT ABDOMEN AND PELVIS WITH CONTRAST TECHNIQUE: Multidetector CT imaging of the abdomen and pelvis was performed using the standard protocol following bolus administration of intravenous contrast. RADIATION DOSE REDUCTION: This exam was performed according to the departmental dose-optimization program which includes automated exposure control, adjustment of the mA and/or kV according to patient size and/or use of iterative reconstruction technique. CONTRAST:  78m OMNIPAQUE IOHEXOL 300 MG/ML  SOLN COMPARISON:  CT abdomen and pelvis dated December 21, 2018 FINDINGS: Lower chest: No acute abnormality. Hepatobiliary: No suspicious liver lesions. Gallbladder is surgically absent. Dilated intrahepatic bile ducts and severe dilation of the common bile duct, measuring up to 1.7 cm. Filling defect of the distal common bile duct measuring up to 1.4 cm and mild wall thickening. Pancreas: Unremarkable. No pancreatic ductal dilatation or surrounding inflammatory changes. Spleen: Normal in size without focal abnormality. Adrenals/Urinary Tract: Bilateral adrenal glands are unremarkable. No hydronephrosis or nephrolithiasis. Bladder is unremarkable. Stomach/Bowel: Small hiatal hernia. Wall thickening of the antrum of the stomach. Diverticulosis. No evidence of obstruction. No bowel wall thickening. Vascular/Lymphatic: Aortic atherosclerosis. No enlarged  abdominal or pelvic lymph nodes. Reproductive: No adnexal masses. Other: Small fat containing umbilical hernia. Surgical clips noted in the right breast. No abdominopelvic ascites. Musculoskeletal: Moderate wedge compression deformity of the T12 vertebral body, unchanged when compared with prior exam. No aggressive appearing osseous lesions. IMPRESSION: 1. Severe biliary  ductal dilation with 1.4 cm filling defect of the distal common bile duct, findings are compatible with choledocholithiasis. Wall thickening of the common bile duct is concerning for superimposed cholangitis. 2. Wall thickening of the antrum of the stomach, findings can be seen in the setting of gastritis. Endoscopy could be performed for further evaluation. 3.  Aortic Atherosclerosis (ICD10-I70.0). Electronically Signed   By: Yetta Glassman M.D.   On: 09/29/2021 17:50   DG Chest Portable 1 View  Result Date: 09/29/2021 CLINICAL DATA:  Leukocytosis EXAM: PORTABLE CHEST 1 VIEW COMPARISON:  11/18/2020 FINDINGS: Midline trachea. Mild cardiomegaly. Mediastinal contours otherwise within normal limits. No pleural effusion or pneumothorax. Clear lungs. Degenerative changes of the left greater than right shoulders. IMPRESSION: Mild cardiomegaly, without acute disease. Electronically Signed   By: Abigail Miyamoto M.D.   On: 09/29/2021 17:45   US Abdomen Limited RUQ (LIVER/GB)  Result Date: 09/29/2021 CLINICAL DATA:  Abdominal pain EXAM: ULTRASOUND ABDOMEN LIMITED RIGHT UPPER QUADRANT COMPARISON:  Same day CT of the abdomen and pelvis dated September 29, 2020 FINDINGS: Gallbladder: Surgically absent. Common bile duct: Diameter: 17.3 Intrahepatic biliary ductal dilation. Liver: No focal lesion identified. Within normal limits in parenchymal echogenicity. Portal vein is patent on color Doppler imaging with normal direction of blood flow towards the liver. Other: None. IMPRESSION: Markedly dilated common bile duct and dilated intrahepatic bile ducts. See same  day CT of the abdomen and pelvis for further evaluation. Electronically Signed   By: Yetta Glassman M.D.   On: 09/29/2021 17:39    Impression: Choledocholithiasis with findings concerning for cholangitis  HGB 13.0 Platelets 116 WBC 32.1 AST 124 ALT 170  Alkphos 164 TBili 5.3 GFR 32   Right upper quadrant ultrasound 09/29/2021 Dilated common bile duct and intrahepatic bile duct  CT abdomen pelvis with contrast 6/13 1. Severe biliary ductal dilation with 1.4 cm filling defect of the distal common bile duct, findings are compatible with choledocholithiasis. Wall thickening of the common bile duct is concerning for superimposed cholangitis. 2. Wall thickening of the antrum of the stomach, findings can be seen in the setting of gastritis.  Patient with nausea, vomiting, fever, significantly elevated white blood cell count, dated LFTs, elevated total bilirubin.  CT scan findings and symptoms consistent with choledocholithiasis with possible cholangitis.    Plan: Plan for ERCP tomorrow. I thoroughly discussed the procedures to include nature, alternatives, benefits, and risks including but not limited to bleeding, perforation, infection, pancreatitis, anesthesia/cardiac and pulmonary complications. Patient provides understanding and gave verbal consent to proceed. Continue Protonix 40 mg daily Continue clear liquid diet NPO at midnight Continue vancomycin 1000 mg every 48 hours and Zosyn 3.375 g every 8 hours. Continue anti-emetics and supportive care as needed. [facility] will follow.     LOS: 0 days   Arvella Nigh Merritt Kibby  PA-C 09/30/2021, 1:24 PM  Contact #  [phone]

## 2021-09-30 NOTE — ED Notes (Signed)
Report given to the Floor. 

## 2021-09-30 NOTE — ED Notes (Signed)
[facility] called for transport. 

## 2021-09-30 NOTE — Consult Note (Signed)
Referring Provider: [facility] Primary Care Physician:  [name], MD Primary Gastroenterologist:  [facility]  Reason for Consultation: Cholangitis  HPI: Valerie Cooper is a 88 y.o. female with past medical history of right breast cancer, GERD, hypertension, hyperlipidemia, osteoporosis presented to the ED on 6/13 with abdominal pain.   Afternoon 2 hours after eating a fatty lunch she began to have epigastric pain.  Later Sunday evening she began to have nausea vomiting, reports feeling febrile.  Starting Monday morning she continued to have nausea vomiting and began to have diarrhea. Seen with Dr. Deforest Hoyles at [place] on 09/29/2021, labs significant for T. bili 5.3, ALP 189, AST 122, ALT 181, white blood cells 33.5, hemoglobin 12.0.  Patient was contacted with lab results and recommended to present to the ED.  Today patient reports she no longer has abdominal pain, no longer feels febrile or has nausea and vomiting.  Bowel movements have returned to normal.  Patient's vital signs are stable.  Patient denies melena, hematochezia, constipation. She has lost about 10 pounds in the last 6 months.  Patient is not on any blood thinning medications, denies NSAID use, denies alcohol use, denies smoking.  Patient recently started on tamoxifen Thursday. History of peptic ulcer disease in the 66s Colonoscopy last in 11/2003 notable for diverticulosis with Dr. Sammuel Cooper No previous EGD Previous cholecystectomy in 2014  Past Medical History:  Diagnosis Date   Cancer [facility]) 04/2021   right breast DCIS   Cataract    Complication of anesthesia, + pseudocholinesterase deficiency    It takes awhile for me to wake up "   DJD (degenerative joint disease)    Family history of anesthesia complication    GERD (gastroesophageal reflux disease)    H/O colonoscopy 04/20/2003   Hyperlipidemia    Hypertension    Osteoporosis    Shingles 04/20/1999   right leg   Ulcer 1980s   PUD ?due to ASA intake    Past  Surgical History:  Procedure Laterality Date   ABDOMINAL HYSTERECTOMY  1975   BREAST LUMPECTOMY WITH RADIOACTIVE SEED LOCALIZATION Right 06/17/2021   Procedure: RIGHT BREAST SEED LOCALIZED LUMPECTOMY;  Surgeon: Stark Klein, MD;  Location: [facility];  Service: General;  Laterality: Right;   CHOLECYSTECTOMY  05/29/2012   Dr Lucia Gaskins   CHOLECYSTECTOMY N/A 05/29/2012   Procedure: LAPAROSCOPIC CHOLECYSTECTOMY WITH INTRAOPERATIVE CHOLANGIOGRAM;  Surgeon: Shann Medal, MD;  Location: [facility];  Service: General;  Laterality: N/A;   FRACTURE SURGERY     L arm   ROTATOR CUFF REPAIR Bilateral 2005   4 surgeries    Prior to Admission medications   Medication Sig Start Date End Date Taking? Authorizing Provider  acetaminophen (TYLENOL) 325 MG tablet Take 2 tablets (650 mg total) by mouth every 6 (six) hours. Patient taking differently: Take 325 mg by mouth every 4 (four) hours as needed for moderate pain, fever or headache. 02/10/16  Yes Vann, Jessica U, DO  amLODipine (NORVASC) 5 MG tablet Take by mouth 2 (two) times daily. 08/28/20  Yes [provider]  atorvastatin (LIPITOR) 20 MG tablet Take 20 mg by mouth daily.   Yes [provider]  cholecalciferol (VITAMIN D3) 25 MCG (1000 UNIT) tablet Take 1,000 Units by mouth daily.   Yes [provider]  ketorolac (ACULAR) 0.5 % ophthalmic solution Place 1 drop into the right eye 3 (three) times daily. 11/12/20  Yes [provider]  pantoprazole (PROTONIX) 40 MG tablet Take 40 mg by mouth daily.  Yes [provider]  prednisoLONE acetate (PRED FORTE) 1 % ophthalmic suspension Place 1 drop into the right eye 3 (three) times daily. 11/13/20  Yes [provider]  tamoxifen (NOLVADEX) 20 MG tablet Take 1 tablet (20 mg total) by mouth daily. 09/22/21  Yes Benay Pike, MD  valsartan (DIOVAN) 80 MG tablet Take 80 mg by mouth daily. 03/07/21  Yes [provider]  oxyCODONE (OXY  IR/ROXICODONE) 5 MG immediate release tablet Take 0.5-1 tablets (2.5-5 mg total) by mouth every 6 (six) hours as needed for severe pain. Patient not taking: Reported on 09/30/2021 06/17/21   Stark Klein, MD    Scheduled Meds: Continuous Infusions:  piperacillin-tazobactam (ZOSYN)  IV 3.375 g (09/30/21 1149)   [START ON 10/01/2021] vancomycin     PRN Meds:.  Allergies as of 09/29/2021 - Review Complete 09/29/2021  Allergen Reaction Noted   Antivert [meclizine hcl]  03/30/2021   Other Other (See Comments) 09/10/2014    Family History  Problem Relation Age of Onset   Stroke Mother    Stroke Father     Social History   Socioeconomic History   Marital status: Widowed    Spouse name: Not on file   Number of children: Not on file   Years of education: Not on file   Highest education level: Not on file  Occupational History   Not on file  Tobacco Use   Smoking status: Never   Smokeless tobacco: Never  Vaping Use   Vaping Use: Never used  Substance and Sexual Activity   Alcohol use: No   Drug use: No   Sexual activity: Not Currently    Birth control/protection: Surgical  Other Topics Concern   Not on file  Social History Narrative   Not on file   Social Determinants of Health   Financial Resource Strain: Not on file  Food Insecurity: Not on file  Transportation Needs: Not on file  Physical Activity: Not on file  Stress: Not on file  Social Connections: Not on file  Intimate Partner Violence: Not on file    Review of Systems: Review of Systems  Constitutional:  Positive for fever. Negative for chills and malaise/fatigue.  HENT:  Negative for hearing loss and tinnitus.   Eyes:  Negative for blurred vision and double vision.  Respiratory:  Negative for cough and hemoptysis.   Cardiovascular:  Negative for chest pain and palpitations.  Gastrointestinal:  Positive for abdominal pain, diarrhea, nausea and vomiting. Negative for blood in stool, constipation, heartburn  and melena.  Genitourinary:  Negative for dysuria and urgency.  Musculoskeletal:  Negative for myalgias and neck pain.  Skin:  Negative for itching and rash.  Neurological:  Positive for weakness. Negative for dizziness and headaches.  Endo/Heme/Allergies:  Negative for environmental allergies. Does not bruise/bleed easily.  Psychiatric/Behavioral:  Negative for depression, substance abuse and suicidal ideas.      Physical Exam:Physical Exam Constitutional:      General: She is not in acute distress.    Appearance: Normal appearance. She is normal weight.  HENT:     Head: Normocephalic and atraumatic.     Right Ear: External ear normal.     Left Ear: External ear normal.     Nose: Nose normal.     Mouth/Throat:     Mouth: Mucous membranes are moist.  Eyes:     General: Scleral icterus present.     Pupils: Pupils are equal, round, and reactive to light.  Cardiovascular:  Rate and Rhythm: Normal rate and regular rhythm.     Pulses: Normal pulses.     Heart sounds: Normal heart sounds.  Pulmonary:     Effort: Pulmonary effort is normal.     Breath sounds: Normal breath sounds.  Abdominal:     General: Abdomen is flat. Bowel sounds are normal. There is no distension.     Palpations: Abdomen is soft. There is no mass.     Tenderness: There is no abdominal tenderness. There is no guarding or rebound.     Hernia: No hernia is present.  Musculoskeletal:        General: No swelling. Normal range of motion.     Cervical back: Normal range of motion and neck supple.  Skin:    General: Skin is warm and dry.     Coloration: Skin is not jaundiced.  Neurological:     General: No focal deficit present.     Mental Status: She is alert and oriented to person, place, and time. Mental status is at baseline.  Psychiatric:        Mood and Affect: Mood normal.        Behavior: Behavior normal.     Vital signs: Vitals:   09/30/21 1100 09/30/21 1230  BP: (!) 103/57 128/85  Pulse: 82    Resp: 18 17  Temp:    SpO2: 94%         GI:  Lab Results: Recent Labs    09/29/21 1550  WBC 32.1*  HGB 13.0  HCT 38.7  PLT 116*   BMET Recent Labs    09/29/21 1550  NA 132*  K 4.3  CL 94*  CO2 25  GLUCOSE 85  BUN 34*  CREATININE 1.56*  CALCIUM 9.8   LFT Recent Labs    09/29/21 1550  PROT 8.3*  ALBUMIN 4.0  AST 124*  ALT 170*  ALKPHOS 164*  BILITOT 5.3*   PT/INR No results for input(s): "LABPROT", "INR" in the last 72 hours.   Studies/Results: CT Abdomen Pelvis W Contrast  Result Date: 09/29/2021 CLINICAL DATA:  Abdominal pain EXAM: CT ABDOMEN AND PELVIS WITH CONTRAST TECHNIQUE: Multidetector CT imaging of the abdomen and pelvis was performed using the standard protocol following bolus administration of intravenous contrast. RADIATION DOSE REDUCTION: This exam was performed according to the departmental dose-optimization program which includes automated exposure control, adjustment of the mA and/or kV according to patient size and/or use of iterative reconstruction technique. CONTRAST:  66m OMNIPAQUE IOHEXOL 300 MG/ML  SOLN COMPARISON:  CT abdomen and pelvis dated December 21, 2018 FINDINGS: Lower chest: No acute abnormality. Hepatobiliary: No suspicious liver lesions. Gallbladder is surgically absent. Dilated intrahepatic bile ducts and severe dilation of the common bile duct, measuring up to 1.7 cm. Filling defect of the distal common bile duct measuring up to 1.4 cm and mild wall thickening. Pancreas: Unremarkable. No pancreatic ductal dilatation or surrounding inflammatory changes. Spleen: Normal in size without focal abnormality. Adrenals/Urinary Tract: Bilateral adrenal glands are unremarkable. No hydronephrosis or nephrolithiasis. Bladder is unremarkable. Stomach/Bowel: Small hiatal hernia. Wall thickening of the antrum of the stomach. Diverticulosis. No evidence of obstruction. No bowel wall thickening. Vascular/Lymphatic: Aortic atherosclerosis. No enlarged  abdominal or pelvic lymph nodes. Reproductive: No adnexal masses. Other: Small fat containing umbilical hernia. Surgical clips noted in the right breast. No abdominopelvic ascites. Musculoskeletal: Moderate wedge compression deformity of the T12 vertebral body, unchanged when compared with prior exam. No aggressive appearing osseous lesions. IMPRESSION: 1. Severe biliary  ductal dilation with 1.4 cm filling defect of the distal common bile duct, findings are compatible with choledocholithiasis. Wall thickening of the common bile duct is concerning for superimposed cholangitis. 2. Wall thickening of the antrum of the stomach, findings can be seen in the setting of gastritis. Endoscopy could be performed for further evaluation. 3.  Aortic Atherosclerosis (ICD10-I70.0). Electronically Signed   By: Yetta Glassman M.D.   On: 09/29/2021 17:50   DG Chest Portable 1 View  Result Date: 09/29/2021 CLINICAL DATA:  Leukocytosis EXAM: PORTABLE CHEST 1 VIEW COMPARISON:  11/18/2020 FINDINGS: Midline trachea. Mild cardiomegaly. Mediastinal contours otherwise within normal limits. No pleural effusion or pneumothorax. Clear lungs. Degenerative changes of the left greater than right shoulders. IMPRESSION: Mild cardiomegaly, without acute disease. Electronically Signed   By: Abigail Miyamoto M.D.   On: 09/29/2021 17:45   US Abdomen Limited RUQ (LIVER/GB)  Result Date: 09/29/2021 CLINICAL DATA:  Abdominal pain EXAM: ULTRASOUND ABDOMEN LIMITED RIGHT UPPER QUADRANT COMPARISON:  Same day CT of the abdomen and pelvis dated September 29, 2020 FINDINGS: Gallbladder: Surgically absent. Common bile duct: Diameter: 17.3 Intrahepatic biliary ductal dilation. Liver: No focal lesion identified. Within normal limits in parenchymal echogenicity. Portal vein is patent on color Doppler imaging with normal direction of blood flow towards the liver. Other: None. IMPRESSION: Markedly dilated common bile duct and dilated intrahepatic bile ducts. See same  day CT of the abdomen and pelvis for further evaluation. Electronically Signed   By: Yetta Glassman M.D.   On: 09/29/2021 17:39    Impression: Choledocholithiasis with findings concerning for cholangitis  HGB 13.0 Platelets 116 WBC 32.1 AST 124 ALT 170  Alkphos 164 TBili 5.3 GFR 32   Right upper quadrant ultrasound 09/29/2021 Dilated common bile duct and intrahepatic bile duct  CT abdomen pelvis with contrast 6/13 1. Severe biliary ductal dilation with 1.4 cm filling defect of the distal common bile duct, findings are compatible with choledocholithiasis. Wall thickening of the common bile duct is concerning for superimposed cholangitis. 2. Wall thickening of the antrum of the stomach, findings can be seen in the setting of gastritis.  Patient with nausea, vomiting, fever, significantly elevated white blood cell count, dated LFTs, elevated total bilirubin.  CT scan findings and symptoms consistent with choledocholithiasis with possible cholangitis.    Plan: Plan for ERCP tomorrow. I thoroughly discussed the procedures to include nature, alternatives, benefits, and risks including but not limited to bleeding, perforation, infection, pancreatitis, anesthesia/cardiac and pulmonary complications. Patient provides understanding and gave verbal consent to proceed. Continue Protonix 40 mg daily Continue clear liquid diet NPO at midnight Continue vancomycin 1000 mg every 48 hours and Zosyn 3.375 g every 8 hours. Continue anti-emetics and supportive care as needed. [facility] will follow.     LOS: 0 days   Arvella Nigh Rella Egelston  PA-C 09/30/2021, 1:24 PM  Contact #  [phone]

## 2021-09-30 NOTE — ED Notes (Signed)
Changed pt into a new brief and made pt more comfortable in the bed.

## 2021-09-30 NOTE — H&P (Addendum)
History and Physical    DOA: 09/29/2021  PCP: [name], MD  Patient coming from: home  Chief Complaint: Sent from PCP office due to abnormal labs  HPI: Valerie Cooper is a 88 y.o. 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  Patient states she was visiting her sister over the weekend and something exacerbated abdominal pain (felt like her peptic ulcer related burning epigastric pain) followed by nausea and several episodes of vomiting/diarrhea.  She also reports feeling feverish but did not check temperature.  Yesterday her symptoms did improve but she had made an appointment to see PCP->on their evaluation found to have abnormal labs with WBC 30 K which prompted ED referral.  Further work-up at [facility] ED showed elevated LFTs, bilirubin 5.3 subsequent to which CT abdomen was obtained which revealed postcholecystectomy state but severe biliary duct dilatation at 1.4 cm with what appeared to be a distal obstruction.  Case was discussed with GI and hospitalist service-patient transferred to [facility] for GI evaluation and possibly MRCP/ERCP. Upon arrival to the floor, patient appears comfortable.  Afebrile, vital stable.  Denies any nausea vomiting or abdominal pain today.  Seen by GI and plan for ERCP in a.m.  She denies any recent medication changes except for tamoxifen which she is started 3 days back.  She states her oncologist did not feel her symptoms were related to this medication however she wants to follow-up with her primary oncologist and see if she can come off this medication.  She denies any chest pain, shortness of breath or leg swellings.  Does report some weight loss but denies noticing any jaundice.   Review of Systems: As per HPI, otherwise review of systems negative.    Past  Medical History:  Diagnosis Date   Cancer ([place]) 04/2021   right breast DCIS   Cataract    Complication of anesthesia, + pseudocholinesterase deficiency    It takes awhile for me to wake up "   DJD (degenerative joint disease)    Family history of anesthesia complication    GERD (gastroesophageal reflux disease)    H/O colonoscopy 04/20/2003   Hyperlipidemia    Hypertension    Osteoporosis    Shingles 04/20/1999   right leg   Ulcer 1980s   PUD ?due to ASA intake    Past Surgical History:  Procedure Laterality Date   ABDOMINAL HYSTERECTOMY  1975   BREAST LUMPECTOMY WITH RADIOACTIVE SEED LOCALIZATION Right 06/17/2021   Procedure: RIGHT BREAST SEED LOCALIZED LUMPECTOMY;  Surgeon: Stark Klein, MD;  Location: [place];  Service: General;  Laterality: Right;   CHOLECYSTECTOMY  05/29/2012   Dr Lucia Gaskins   CHOLECYSTECTOMY N/A 05/29/2012   Procedure: LAPAROSCOPIC CHOLECYSTECTOMY WITH INTRAOPERATIVE CHOLANGIOGRAM;  Surgeon: Shann Medal, MD;  Location: [place];  Service: General;  Laterality: N/A;   FRACTURE SURGERY     L arm   ROTATOR CUFF REPAIR Bilateral 2005   4 surgeries    Social history:  reports that she has never smoked. She has never used smokeless tobacco. She reports that she does not drink alcohol and does not use drugs.   Allergies  Allergen Reactions   Antivert [Meclizine Hcl]     Other reaction(s): hallucinations   Other Other (See Comments)    Anesthesia medications cause "deep sleep"    Family History  Problem Relation Age of Onset   Stroke Mother    Stroke Father       Prior to Admission medications   Medication Sig Start Date End Date Taking? Authorizing Provider  acetaminophen (TYLENOL) 325 MG tablet Take 2 tablets (650 mg total) by mouth every 6 (six) hours. Patient taking differently: Take 325 mg by mouth every 4 (four) hours as needed for moderate pain, fever or headache. 02/10/16  Yes Vann, Jessica U, DO  amLODipine (NORVASC) 5 MG  tablet Take by mouth 2 (two) times daily. 08/28/20  Yes [provider]  atorvastatin (LIPITOR) 20 MG tablet Take 20 mg by mouth daily.   Yes [provider]  cholecalciferol (VITAMIN D3) 25 MCG (1000 UNIT) tablet Take 1,000 Units by mouth daily.   Yes [provider]  ketorolac (ACULAR) 0.5 % ophthalmic solution Place 1 drop into the right eye 3 (three) times daily. 11/12/20  Yes [provider]  pantoprazole (PROTONIX) 40 MG tablet Take 40 mg by mouth daily.   Yes [provider]  prednisoLONE acetate (PRED FORTE) 1 % ophthalmic suspension Place 1 drop into the right eye 3 (three) times daily. 11/13/20  Yes [provider]  tamoxifen (NOLVADEX) 20 MG tablet Take 1 tablet (20 mg total) by mouth daily. 09/22/21  Yes Benay Pike, MD  valsartan (DIOVAN) 80 MG tablet Take 80 mg by mouth daily. 03/07/21  Yes [provider]  oxyCODONE (OXY IR/ROXICODONE) 5 MG immediate release tablet Take 0.5-1 tablets (2.5-5 mg total) by mouth every 6 (six) hours as needed for severe pain. Patient not taking: Reported on 09/30/2021 06/17/21   Stark Klein, MD    Physical Exam: Vitals:   09/30/21 1100 09/30/21 1230 09/30/21 1328 09/30/21 1339  BP: (!) 103/57 128/85 111/63   Pulse: 82     Resp: '18 17 20   '$ Temp:   98.9 F (37.2 C)   TempSrc:   Oral   SpO2: 94%  98% 98%  Weight:      Height:        Constitutional: NAD, calm, comfortable Eyes: PERRL, lids and conjunctivae normal-no significant jaundice appreciated today ENMT: Mucous membranes are moist. Posterior pharynx clear of any exudate or lesions.Normal dentition.  Neck: normal, supple, no masses, no thyromegaly Respiratory: clear to auscultation bilaterally, no wheezing, no crackles. Normal respiratory effort. No accessory muscle use.  Cardiovascular: Regular rate and rhythm, no murmurs / rubs / gallops. No extremity edema. 2+ pedal pulses. No carotid bruits.  Abdomen: Mild epigastric  tenderness, no masses palpated. No hepatosplenomegaly. Bowel sounds positive.  Musculoskeletal: no clubbing / cyanosis. No joint deformity upper and lower extremities. Good ROM, no contractures. Normal muscle tone.  Neurologic: CN 2-12 grossly intact. Sensation intact, DTR normal. Strength 5/5 in all 4.  Psychiatric: Normal judgment and insight. Alert and oriented x 3. Normal mood.  SKIN/catheters: no rashes, lesions, ulcers. No induration  Labs on Admission: I have personally reviewed following labs and imaging studies  CBC: Recent Labs  Lab 09/29/21 1550 09/30/21 1319  WBC 32.1* 29.5*  NEUTROABS 29.3* 27.7*  HGB 13.0 12.4  HCT 38.7 35.9*  MCV 93.9 93.0  PLT 116* 962   Basic Metabolic Panel: Recent Labs  Lab 09/29/21 1550 09/30/21 1319  NA 132* 136  K 4.3 3.2*  CL 94* 102  CO2 25 22  GLUCOSE 85 74  BUN 34* 26*  CREATININE 1.56* 1.31*  CALCIUM 9.8 8.8*   GFR: Estimated Creatinine Clearance: 26.9 mL/min (A) (by  C-G formula based on SCr of 1.31 mg/dL (H)). Recent Labs  Lab 09/29/21 1550 09/29/21 1817 09/30/21 1319  WBC 32.1*  --  29.5*  LATICACIDVEN  --  1.2  --    Liver Function Tests: Recent Labs  Lab 09/29/21 1550 09/30/21 1319  AST 124* 49*  ALT 170* 103*  ALKPHOS 164* 139*  BILITOT 5.3* 2.2*  PROT 8.3* 6.8  ALBUMIN 4.0 2.9*   Recent Labs  Lab 09/29/21 1550  LIPASE <10*   No results for input(s): "AMMONIA" in the last 168 hours. Coagulation Profile: No results for input(s): "INR", "PROTIME" in the last 168 hours. Cardiac Enzymes: No results for input(s): "CKTOTAL", "CKMB", "CKMBINDEX", "TROPONINI" in the last 168 hours. BNP (last 3 results) No results for input(s): "PROBNP" in the last 8760 hours. HbA1C: No results for input(s): "HGBA1C" in the last 72 hours. CBG: No results for input(s): "GLUCAP" in the last 168 hours. Lipid Profile: No results for input(s): "CHOL", "HDL", "LDLCALC", "TRIG", "CHOLHDL", "LDLDIRECT" in the last 72  hours. Thyroid Function Tests: No results for input(s): "TSH", "T4TOTAL", "FREET4", "T3FREE", "THYROIDAB" in the last 72 hours. Anemia Panel: No results for input(s): "VITAMINB12", "FOLATE", "FERRITIN", "TIBC", "IRON", "RETICCTPCT" in the last 72 hours. Urine analysis:    Component Value Date/Time   COLORURINE YELLOW 09/29/2021 1550   APPEARANCEUR HAZY (A) 09/29/2021 1550   LABSPEC 1.024 09/29/2021 1550   PHURINE 5.5 09/29/2021 1550   GLUCOSEU NEGATIVE 09/29/2021 1550   HGBUR SMALL (A) 09/29/2021 1550   BILIRUBINUR MODERATE (A) 09/29/2021 1550   KETONESUR NEGATIVE 09/29/2021 1550   PROTEINUR 30 (A) 09/29/2021 1550   UROBILINOGEN 0.2 02/13/2015 0045   NITRITE NEGATIVE 09/29/2021 1550   LEUKOCYTESUR MODERATE (A) 09/29/2021 1550    Radiological Exams on Admission: Personally reviewed  CT Abdomen Pelvis W Contrast  Result Date: 09/29/2021 CLINICAL DATA:  Abdominal pain EXAM: CT ABDOMEN AND PELVIS WITH CONTRAST TECHNIQUE: Multidetector CT imaging of the abdomen and pelvis was performed using the standard protocol following bolus administration of intravenous contrast. RADIATION DOSE REDUCTION: This exam was performed according to the departmental dose-optimization program which includes automated exposure control, adjustment of the mA and/or kV according to patient size and/or use of iterative reconstruction technique. CONTRAST:  20m OMNIPAQUE IOHEXOL 300 MG/ML  SOLN COMPARISON:  CT abdomen and pelvis dated December 21, 2018 FINDINGS: Lower chest: No acute abnormality. Hepatobiliary: No suspicious liver lesions. Gallbladder is surgically absent. Dilated intrahepatic bile ducts and severe dilation of the common bile duct, measuring up to 1.7 cm. Filling defect of the distal common bile duct measuring up to 1.4 cm and mild wall thickening. Pancreas: Unremarkable. No pancreatic ductal dilatation or surrounding inflammatory changes. Spleen: Normal in size without focal abnormality. Adrenals/Urinary  Tract: Bilateral adrenal glands are unremarkable. No hydronephrosis or nephrolithiasis. Bladder is unremarkable. Stomach/Bowel: Small hiatal hernia. Wall thickening of the antrum of the stomach. Diverticulosis. No evidence of obstruction. No bowel wall thickening. Vascular/Lymphatic: Aortic atherosclerosis. No enlarged abdominal or pelvic lymph nodes. Reproductive: No adnexal masses. Other: Small fat containing umbilical hernia. Surgical clips noted in the right breast. No abdominopelvic ascites. Musculoskeletal: Moderate wedge compression deformity of the T12 vertebral body, unchanged when compared with prior exam. No aggressive appearing osseous lesions. IMPRESSION: 1. Severe biliary ductal dilation with 1.4 cm filling defect of the distal common bile duct, findings are compatible with choledocholithiasis. Wall thickening of the common bile duct is concerning for superimposed cholangitis. 2. Wall thickening of the antrum of the stomach, findings can be  seen in the setting of gastritis. Endoscopy could be performed for further evaluation. 3.  Aortic Atherosclerosis (ICD10-I70.0). Electronically Signed   By: Yetta Glassman M.D.   On: 09/29/2021 17:50   DG Chest Portable 1 View  Result Date: 09/29/2021 CLINICAL DATA:  Leukocytosis EXAM: PORTABLE CHEST 1 VIEW COMPARISON:  11/18/2020 FINDINGS: Midline trachea. Mild cardiomegaly. Mediastinal contours otherwise within normal limits. No pleural effusion or pneumothorax. Clear lungs. Degenerative changes of the left greater than right shoulders. IMPRESSION: Mild cardiomegaly, without acute disease. Electronically Signed   By: Abigail Miyamoto M.D.   On: 09/29/2021 17:45   US Abdomen Limited RUQ (LIVER/GB)  Result Date: 09/29/2021 CLINICAL DATA:  Abdominal pain EXAM: ULTRASOUND ABDOMEN LIMITED RIGHT UPPER QUADRANT COMPARISON:  Same day CT of the abdomen and pelvis dated September 29, 2020 FINDINGS: Gallbladder: Surgically absent. Common bile duct: Diameter: 17.3  Intrahepatic biliary ductal dilation. Liver: No focal lesion identified. Within normal limits in parenchymal echogenicity. Portal vein is patent on color Doppler imaging with normal direction of blood flow towards the liver. Other: None. IMPRESSION: Markedly dilated common bile duct and dilated intrahepatic bile ducts. See same day CT of the abdomen and pelvis for further evaluation. Electronically Signed   By: Yetta Glassman M.D.   On: 09/29/2021 17:39         Assessment and Plan:   Principal Problem:   Cholangitis Active Problems:   Acute cholangitis    1.Leukocytosis: In the setting of GI symptoms and CT findings, concern for cholangitis and patient started on IV Zosyn at outside hospital.  GI recommended to continue this and plan for ERCP in a.m.  If no infectious source found on ERCP, would suspect reactive leukocytosis from gastroenteritis.  Repeat labs show WBC 29 K today.  Blood cultures ordered.  Afebrile.  2.   CBD dilatation: Choledocholithiasis versus stricture-patient is s/p cholecystectomy and abdominal pain not significant at this time.  Follow-up ERCP results in AM.  Empiric antibiotics for presumed cholangitis at this point.  Lipase less than 10.  Repeat CBC in a.m.  3.  Abnormal LFTs, hyperbilirubinemia: Secondary to problem #2 versus cholestasis due to diarrhea/medications.  Patient reports recent medication change- started taking tamoxifen 3 days back.  Repeat labs today show improvement in T. bili from 5.3->2.9.  Appreciate GI evaluation and recommendations.  Patient is s/p cholecystectomy.  Avoid hepatotoxic agents.  Hold statins for now.  4.  History of peptic ulcer disease: Resume PPI  5.  AKI on CKD stage IIIa: Patient did have elevated creatinine 1.58 on presentation from baseline 1.1.  Improving with hydration to 1.3 today.  6.  Hyperlipidemia: Hold statins in view of problem #3  7.  Breast cancer: S/p surgery and radiation.  Follows oncology in community and  was recently started on tamoxifen.  Patient wants to discuss with her primary oncologist before resuming this medication.  8.  Hypertension: Resume home meds   DVT prophylaxis: SCDs as anticipated procedure in a.m.   Code Status:   Full code as confirmed with patient.  She does have a living well and.Health care proxy would be her daughter, Ivin Booty, who lives in [place] #[phone].  Patient states she also has 3 sisters who live in the area and one of the sisters Ms. Ned Grace is listed as emergency contact  Patient/Family Communication: Discussed with patient and all questions answered to satisfaction.  Consults called: GI Dr. Therisa Doyne Admission status :I certify that at the point of admission it is my clinical  judgment that the patient will require inpatient hospital care spanning beyond 2 midnights from the point of admission due to high intensity of service and high frequency of surveillance required.Inpatient status is judged to be reasonable and necessary in order to provide the required intensity of service to ensure the patient's safety. The patient's presenting symptoms, physical exam findings, and initial radiographic and laboratory data in the context of their chronic comorbidities is felt to place them at high risk for further clinical deterioration. The following factors support the patient status of inpatient : Choledocholithiasis with associated acute cholangitis/significant leukocytosis requiring ERCP and IV antibiotics.     Guilford Shi MD [facility] Pager in [place]  If 7PM-7AM, please contact night-coverage [email or web address]   09/30/2021, 3:03 PM

## 2021-10-01 ENCOUNTER — Inpatient Hospital Stay (HOSPITAL_COMMUNITY): Payer: Medicare Other | Admitting: Anesthesiology

## 2021-10-01 ENCOUNTER — Inpatient Hospital Stay (HOSPITAL_COMMUNITY): Payer: Medicare Other

## 2021-10-01 ENCOUNTER — Encounter (HOSPITAL_COMMUNITY)
Admission: EM | Disposition: A | Discharge status: Home / Self Care | Payer: Self-pay | Source: Home / Self Care | Attending: Internal Medicine

## 2021-10-01 ENCOUNTER — Encounter (HOSPITAL_COMMUNITY): Payer: Self-pay | Admitting: Internal Medicine

## 2021-10-01 DIAGNOSIS — K8050 Calculus of bile duct without cholangitis or cholecystitis without obstruction: Secondary | ICD-10-CM | POA: Diagnosis not present

## 2021-10-01 DIAGNOSIS — K8020 Calculus of gallbladder without cholecystitis without obstruction: Secondary | ICD-10-CM | POA: Diagnosis not present

## 2021-10-01 DIAGNOSIS — K838 Other specified diseases of biliary tract: Secondary | ICD-10-CM

## 2021-10-01 DIAGNOSIS — I10 Essential (primary) hypertension: Secondary | ICD-10-CM | POA: Diagnosis not present

## 2021-10-01 DIAGNOSIS — K8309 Other cholangitis: Secondary | ICD-10-CM | POA: Diagnosis not present

## 2021-10-01 DIAGNOSIS — M1990 Unspecified osteoarthritis, unspecified site: Secondary | ICD-10-CM

## 2021-10-01 HISTORY — PX: STONE EXTRACTION WITH BASKET: SHX5318

## 2021-10-01 HISTORY — PX: SPHINCTEROTOMY: SHX5279

## 2021-10-01 HISTORY — PX: REMOVAL OF STONES: SHX5545

## 2021-10-01 HISTORY — PX: ENDOSCOPIC RETROGRADE CHOLANGIOPANCREATOGRAPHY (ERCP) WITH PROPOFOL: SHX5810

## 2021-10-01 LAB — BLOOD CULTURE ID PANEL (REFLEXED) - BCID2

## 2021-10-01 LAB — CBC
HCT: 30.4 % — ABNORMAL LOW (ref 36.0–46.0)
Hemoglobin: 10.7 g/dL — ABNORMAL LOW (ref 12.0–15.0)
MCH: 32.4 pg (ref 26.0–34.0)
MCHC: 35.2 g/dL (ref 30.0–36.0)
MCV: 92.1 fL (ref 80.0–100.0)
Platelets: 182 10*3/uL (ref 150–400)
RBC: 3.3 MIL/uL — ABNORMAL LOW (ref 3.87–5.11)
RDW: 13.4 % (ref 11.5–15.5)
WBC: 24.1 10*3/uL — ABNORMAL HIGH (ref 4.0–10.5)
nRBC: 0 % (ref 0.0–0.2)

## 2021-10-01 LAB — COMPREHENSIVE METABOLIC PANEL
ALT: 76 U/L — ABNORMAL HIGH (ref 0–44)
AST: 31 U/L (ref 15–41)
Albumin: 2.5 g/dL — ABNORMAL LOW (ref 3.5–5.0)
Alkaline Phosphatase: 129 U/L — ABNORMAL HIGH (ref 38–126)
Anion gap: 9 (ref 5–15)
BUN: 20 mg/dL (ref 8–23)
CO2: 25 mmol/L (ref 22–32)
Calcium: 8.4 mg/dL — ABNORMAL LOW (ref 8.9–10.3)
Chloride: 105 mmol/L (ref 98–111)
Creatinine, Ser: 1.17 mg/dL — ABNORMAL HIGH (ref 0.44–1.00)
GFR, Estimated: 45 mL/min — ABNORMAL LOW (ref >60)
Glucose, Bld: 85 mg/dL (ref 70–99)
Potassium: 2.9 mmol/L — ABNORMAL LOW (ref 3.5–5.1)
Sodium: 139 mmol/L (ref 135–145)
Total Bilirubin: 1.6 mg/dL — ABNORMAL HIGH (ref 0.3–1.2)
Total Protein: 6.2 g/dL — ABNORMAL LOW (ref 6.5–8.1)

## 2021-10-01 SURGERY — ENDOSCOPIC RETROGRADE CHOLANGIOPANCREATOGRAPHY (ERCP) WITH PROPOFOL
Anesthesia: General

## 2021-10-01 MED ORDER — ROCURONIUM BROMIDE 10 MG/ML (PF) SYRINGE
PREFILLED_SYRINGE | INTRAVENOUS | Status: DC | PRN
  Administered 2021-10-01: 50 mg via INTRAVENOUS

## 2021-10-01 MED ORDER — DICLOFENAC SUPPOSITORY 100 MG
RECTAL | Status: DC | PRN
  Administered 2021-10-01: 100 mg via RECTAL

## 2021-10-01 MED ORDER — ONDANSETRON HCL 4 MG/2ML IJ SOLN
INTRAMUSCULAR | Status: DC | PRN
  Administered 2021-10-01: 4 mg via INTRAVENOUS

## 2021-10-01 MED ORDER — LACTATED RINGERS IV SOLN: INTRAVENOUS | Status: DC | PRN

## 2021-10-01 MED ORDER — SUGAMMADEX SODIUM 200 MG/2ML IV SOLN
INTRAVENOUS | Status: DC | PRN
  Administered 2021-10-01: 266 mg via INTRAVENOUS

## 2021-10-01 MED ORDER — LIDOCAINE 2% (20 MG/ML) 5 ML SYRINGE
INTRAMUSCULAR | Status: DC | PRN
  Administered 2021-10-01: 50 mg via INTRAVENOUS

## 2021-10-01 MED ORDER — POTASSIUM CHLORIDE 10 MEQ/100ML IV SOLN
10.0000 meq | INTRAVENOUS | Status: AC
  Administered 2021-10-01 (×3): 10 meq via INTRAVENOUS
  Filled 2021-10-01 (×4): qty 100

## 2021-10-01 MED ORDER — SODIUM CHLORIDE 0.9 % IV SOLN
INTRAVENOUS | Status: DC | PRN
  Administered 2021-10-01: 25 mL

## 2021-10-01 MED ORDER — PHENYLEPHRINE 80 MCG/ML (10ML) SYRINGE FOR IV PUSH (FOR BLOOD PRESSURE SUPPORT)
PREFILLED_SYRINGE | INTRAVENOUS | Status: DC | PRN
  Administered 2021-10-01 (×2): 80 ug via INTRAVENOUS

## 2021-10-01 MED ORDER — FENTANYL CITRATE (PF) 250 MCG/5ML IJ SOLN
INTRAMUSCULAR | Status: DC | PRN
Start: 2021-10-01 — End: 2021-10-01
  Administered 2021-10-01: 50 ug via INTRAVENOUS

## 2021-10-01 MED ORDER — DEXAMETHASONE SODIUM PHOSPHATE 10 MG/ML IJ SOLN
INTRAMUSCULAR | Status: DC | PRN
  Administered 2021-10-01: 4 mg via INTRAVENOUS

## 2021-10-01 MED ORDER — FENTANYL CITRATE (PF) 100 MCG/2ML IJ SOLN
INTRAMUSCULAR | Status: AC
  Filled 2021-10-01: qty 2

## 2021-10-01 MED ORDER — POTASSIUM CHLORIDE 10 MEQ/100ML IV SOLN
10.0000 meq | INTRAVENOUS | Status: AC
  Administered 2021-10-01 (×3): 10 meq via INTRAVENOUS
  Filled 2021-10-01 (×2): qty 100

## 2021-10-01 MED ORDER — DICLOFENAC SUPPOSITORY 100 MG
RECTAL | Status: AC
  Filled 2021-10-01: qty 1

## 2021-10-01 MED ORDER — SODIUM CHLORIDE 0.9 % IV SOLN: INTRAVENOUS | Status: DC

## 2021-10-01 MED ORDER — PROPOFOL 10 MG/ML IV BOLUS
INTRAVENOUS | Status: DC | PRN
  Administered 2021-10-01: 120 mg via INTRAVENOUS

## 2021-10-01 NOTE — Anesthesia Postprocedure Evaluation (Signed)
Anesthesia Post Note  Patient: Valerie Cooper  Procedure(s) Performed: ENDOSCOPIC RETROGRADE CHOLANGIOPANCREATOGRAPHY (ERCP) WITH PROPOFOL SPHINCTEROTOMY REMOVAL OF STONES STONE EXTRACTION WITH BASKET     Patient location during evaluation: PACU Anesthesia Type: General Level of consciousness: awake and alert Pain management: pain level controlled Vital Signs Assessment: post-procedure vital signs reviewed and stable Respiratory status: spontaneous breathing, nonlabored ventilation, respiratory function stable and patient connected to nasal cannula oxygen Cardiovascular status: blood pressure returned to baseline and stable Postop Assessment: no apparent nausea or vomiting Anesthetic complications: no   No notable events documented.  Last Vitals:  Vitals:   10/01/21 1128 10/01/21 1148  BP: 124/66 138/63  Pulse: 69   Resp: 16 18  Temp: 36.8 C 36.9 C  SpO2: 96% 96%    Last Pain:  Vitals:   10/01/21 1148  TempSrc: Oral  PainSc:                  [name]

## 2021-10-01 NOTE — Progress Notes (Signed)
PROGRESS NOTE    KOBI ALLER  RDE:[id number] DOB: 27-Aug-1933 DOA: 09/29/2021 PCP: [name], MD   Chief Complaint  Patient presents with   Emesis    Brief Narrative:   Valerie Cooper is a 88 y.o. 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 -Her work-up significant for elevated LFTs, leukocytosis, with clinical suspicion of cholangitis, patient was transferred from [facility] to [facility], for ERCP by GI.    Assessment & Plan:   Active Problems:   Symptomatic cholelithiasis   PUD (peptic ulcer disease) in distant past   HTN (hypertension)   HLD (hyperlipidemia)   Malignant neoplasm of upper-inner quadrant of right breast in female, estrogen receptor positive ([place])   Acute cholangitis  Choledocholithiasis Cholangitis E coli bacteremia Transaminitis Jaundice/hyperbilirubinemia -Status post cholecystostomy. -Second for hyperbilirubinemia, transaminitis, imaging suspicious for dilated common bile duct. -Blood culture growing E. coli, follow on susceptibilities, meanwhile continue with IV Zosyn, appears to be improving, nontoxic-appearing. -GI input greatly appreciated, status post ERCP with common bile duct stone noted and proximal third dilated, status post Complete removal was accomplished by biliary sphincterotomy and balloon and basket extraction,   biliary sphincterotomy was performed.The biliary tree was swept.  Hypokalemia -Repleted  History of peptic ulcer disease: Resume PPI   AKI on CKD stage IIIa: Patient did have elevated creatinine 1.58 on presentation from baseline 1.1.  Improving with hydration to 1.3 today.   Hyperlipidemia: Hold statins in view of problem #3    Breast cancer: S/p surgery and radiation.  Follows oncology in community and was recently started  on tamoxifen.  Patient wants to discuss with her primary oncologist before resuming this medication.   Hypertension: Resume home meds    DVT prophylaxis: SCD Code Status: Full Family Communication: D/W sister at bedside Disposition:   Status is: Inpatient Remains inpatient appropriate because: IV ABX   Consultants:  GI   Subjective:  No abdominal pain, no fever, no chills, some nausea, but no vomiting since admission  Objective: Vitals:   10/01/21 0515 10/01/21 0540 10/01/21 0753 10/01/21 0902  BP: 119/67  134/81 (!) 162/75  Pulse: 61 61  86  Resp:  '16 18 12  '$ Temp:  98 F (36.7 C) 98.5 F (36.9 C) 98.5 F (36.9 C)  TempSrc:  Oral Oral Oral  SpO2: 98% 98% 95% 99%  Weight:      Height:        Intake/Output Summary (Last 24 hours) at 10/01/2021 1026 Last data filed at 10/01/2021 0223 Gross per 24 hour  Intake 1014.14 ml  Output --  Net 1014.14 ml   Filed Weights   09/29/21 1535  Weight: 66.5 kg    Examination:  General exam: Appears calm and comfortable  Respiratory system: Clear to auscultation. Respiratory effort normal. Cardiovascular system: S1 & S2 heard, RRR. No JVD, murmurs, rubs, gallops or clicks. No pedal edema. Gastrointestinal system: Abdomen is nondistended, soft and nontender. No organomegaly or masses felt. Normal bowel sounds heard. Central nervous system: Alert and oriented. No focal neurological deficits. Extremities: Symmetric 5 x 5 power. Skin: No rashes, lesions or ulcers Psychiatry: Judgement and insight appear normal. Mood & affect appropriate.     Data Reviewed: I have personally reviewed following labs and imaging studies  CBC: Recent Labs  Lab 09/29/21 1550 09/30/21 1319 10/01/21 0054  WBC  32.1* 29.5* 24.1*  NEUTROABS 29.3* 27.7*  --   HGB 13.0 12.4 10.7*  HCT 38.7 35.9* 30.4*  MCV 93.9 93.0 92.1  PLT 116* 191 706    Basic Metabolic Panel: Recent Labs  Lab 09/29/21 1550 09/30/21 1319 10/01/21 0054  NA 132* 136  139  K 4.3 3.2* 2.9*  CL 94* 102 105  CO2 '25 22 25  '$ GLUCOSE 85 74 85  BUN 34* 26* 20  CREATININE 1.56* 1.31* 1.17*  CALCIUM 9.8 8.8* 8.4*    GFR: Estimated Creatinine Clearance: 30.1 mL/min (A) (by C-G formula based on SCr of 1.17 mg/dL (H)).  Liver Function Tests: Recent Labs  Lab 09/29/21 1550 09/30/21 1319 10/01/21 0054  AST 124* 49* 31  ALT 170* 103* 76*  ALKPHOS 164* 139* 129*  BILITOT 5.3* 2.2* 1.6*  PROT 8.3* 6.8 6.2*  ALBUMIN 4.0 2.9* 2.5*    CBG: No results for input(s): "GLUCAP" in the last 168 hours.   Recent Results (from the past 240 hour(s))  Culture, blood (routine x 2)     Status: Abnormal (Preliminary result)   Collection Time: 09/29/21  6:12 PM   Specimen: BLOOD  Result Value Ref Range Status   Specimen Description   Final    BLOOD LEFT ANTECUBITAL Performed at [facility], [address] [place], [place] [address]    Special Requests   Final    BOTTLES DRAWN AEROBIC AND ANAEROBIC Blood Culture adequate volume Performed at [facility], [address], [place], [place] [address]    Culture  Setup Time   Final    GRAM NEGATIVE RODS AEROBIC BOTTLE ONLY CRITICAL RESULT CALLED TO, READ BACK BY AND VERIFIED WITH: PHARMD JAMES LEDFORD 10/01/21'@1'$ :54 BY TW    Culture (A)  Final    ESCHERICHIA COLI SUSCEPTIBILITIES TO FOLLOW Performed at [facility], [place] [address], [place],  [address]    Report Status PENDING  Incomplete  Blood Culture ID Panel (Reflexed)     Status: Abnormal   Collection Time: 09/29/21  6:12 PM  Result Value Ref Range Status   Enterococcus faecalis NOT DETECTED NOT DETECTED Final   Enterococcus Faecium NOT DETECTED NOT DETECTED Final   Listeria monocytogenes NOT DETECTED NOT DETECTED Final   Staphylococcus species NOT DETECTED NOT DETECTED Final   Staphylococcus aureus (BCID) NOT DETECTED NOT DETECTED Final   Staphylococcus epidermidis NOT DETECTED NOT DETECTED Final    Staphylococcus lugdunensis NOT DETECTED NOT DETECTED Final   Streptococcus species NOT DETECTED NOT DETECTED Final   Streptococcus agalactiae NOT DETECTED NOT DETECTED Final   Streptococcus pneumoniae NOT DETECTED NOT DETECTED Final   Streptococcus pyogenes NOT DETECTED NOT DETECTED Final   A.calcoaceticus-baumannii NOT DETECTED NOT DETECTED Final   Bacteroides fragilis NOT DETECTED NOT DETECTED Final   Enterobacterales DETECTED (A) NOT DETECTED Final    Comment: Enterobacterales represent a large order of gram negative bacteria, not a single organism. CRITICAL RESULT CALLED TO, READ BACK BY AND VERIFIED WITH: PHARMD JAMES LEDFORD 10/01/21'@1'$ :54 BY TW    Enterobacter cloacae complex NOT DETECTED NOT DETECTED Final   Escherichia coli DETECTED (A) NOT DETECTED Final    Comment: CRITICAL RESULT CALLED TO, READ BACK BY AND VERIFIED WITH: PHARMD JAMES LEDFORD 10/01/21'@1'$ :54 BY TW    Klebsiella aerogenes NOT DETECTED NOT DETECTED Final   Klebsiella oxytoca NOT DETECTED NOT DETECTED Final   Klebsiella pneumoniae NOT DETECTED NOT DETECTED Final   Proteus species NOT DETECTED NOT DETECTED Final   Salmonella species  NOT DETECTED NOT DETECTED Final   Serratia marcescens NOT DETECTED NOT DETECTED Final   Haemophilus influenzae NOT DETECTED NOT DETECTED Final   Neisseria meningitidis NOT DETECTED NOT DETECTED Final   Pseudomonas aeruginosa NOT DETECTED NOT DETECTED Final   Stenotrophomonas maltophilia NOT DETECTED NOT DETECTED Final   Candida albicans NOT DETECTED NOT DETECTED Final   Candida auris NOT DETECTED NOT DETECTED Final   Candida glabrata NOT DETECTED NOT DETECTED Final   Candida krusei NOT DETECTED NOT DETECTED Final   Candida parapsilosis NOT DETECTED NOT DETECTED Final   Candida tropicalis NOT DETECTED NOT DETECTED Final   Cryptococcus neoformans/gattii NOT DETECTED NOT DETECTED Final   CTX-M ESBL NOT DETECTED NOT DETECTED Final   Carbapenem resistance IMP NOT DETECTED NOT DETECTED  Final   Carbapenem resistance KPC NOT DETECTED NOT DETECTED Final   Carbapenem resistance NDM NOT DETECTED NOT DETECTED Final   Carbapenem resist OXA 48 LIKE NOT DETECTED NOT DETECTED Final   Carbapenem resistance VIM NOT DETECTED NOT DETECTED Final    Comment: Performed at [facility], [address], [place], [place] [address]  Culture, blood (routine x 2)     Status: None (Preliminary result)   Collection Time: 09/29/21  6:17 PM   Specimen: BLOOD  Result Value Ref Range Status   Specimen Description   Final    BLOOD BLOOD RIGHT FOREARM Performed at [facility], [address] [place], [place] [address]    Special Requests   Final    Blood Culture adequate volume BOTTLES DRAWN AEROBIC AND ANAEROBIC Performed at [facility], [address] [place], [place] [address]    Culture   Final    NO GROWTH 2 DAYS Performed at [facility], [place] [address], [place], [place] [address]    Report Status PENDING  Incomplete         Radiology Studies: CT Abdomen Pelvis W Contrast  Result Date: 09/29/2021 CLINICAL DATA:  Abdominal pain EXAM: CT ABDOMEN AND PELVIS WITH CONTRAST TECHNIQUE: Multidetector CT imaging of the abdomen and pelvis was performed using the standard protocol following bolus administration of intravenous contrast. RADIATION DOSE REDUCTION: This exam was performed according to the departmental dose-optimization program which includes automated exposure control, adjustment of the mA and/or kV according to patient size and/or use of iterative reconstruction technique. CONTRAST:  51m OMNIPAQUE IOHEXOL 300 MG/ML  SOLN COMPARISON:  CT abdomen and pelvis dated December 21, 2018 FINDINGS: Lower chest: No acute abnormality. Hepatobiliary: No suspicious liver lesions. Gallbladder is surgically absent. Dilated intrahepatic bile ducts and severe dilation of the common bile duct, measuring up to 1.7 cm. Filling defect of the distal  common bile duct measuring up to 1.4 cm and mild wall thickening. Pancreas: Unremarkable. No pancreatic ductal dilatation or surrounding inflammatory changes. Spleen: Normal in size without focal abnormality. Adrenals/Urinary Tract: Bilateral adrenal glands are unremarkable. No hydronephrosis or nephrolithiasis. Bladder is unremarkable. Stomach/Bowel: Small hiatal hernia. Wall thickening of the antrum of the stomach. Diverticulosis. No evidence of obstruction. No bowel wall thickening. Vascular/Lymphatic: Aortic atherosclerosis. No enlarged abdominal or pelvic lymph nodes. Reproductive: No adnexal masses. Other: Small fat containing umbilical hernia. Surgical clips noted in the right breast. No abdominopelvic ascites. Musculoskeletal: Moderate wedge compression deformity of the T12 vertebral body, unchanged when compared with prior exam. No aggressive appearing osseous lesions. IMPRESSION: 1. Severe biliary ductal dilation with 1.4 cm filling defect of the distal common bile duct, findings are compatible with choledocholithiasis. Wall thickening of  the common bile duct is concerning for superimposed cholangitis. 2. Wall thickening of the antrum of the stomach, findings can be seen in the setting of gastritis. Endoscopy could be performed for further evaluation. 3.  Aortic Atherosclerosis (ICD10-I70.0). Electronically Signed   By: Yetta Glassman M.D.   On: 09/29/2021 17:50   DG Chest Portable 1 View  Result Date: 09/29/2021 CLINICAL DATA:  Leukocytosis EXAM: PORTABLE CHEST 1 VIEW COMPARISON:  11/18/2020 FINDINGS: Midline trachea. Mild cardiomegaly. Mediastinal contours otherwise within normal limits. No pleural effusion or pneumothorax. Clear lungs. Degenerative changes of the left greater than right shoulders. IMPRESSION: Mild cardiomegaly, without acute disease. Electronically Signed   By: Abigail Miyamoto M.D.   On: 09/29/2021 17:45   US Abdomen Limited RUQ (LIVER/GB)  Result Date: 09/29/2021 CLINICAL DATA:   Abdominal pain EXAM: ULTRASOUND ABDOMEN LIMITED RIGHT UPPER QUADRANT COMPARISON:  Same day CT of the abdomen and pelvis dated September 29, 2020 FINDINGS: Gallbladder: Surgically absent. Common bile duct: Diameter: 17.3 Intrahepatic biliary ductal dilation. Liver: No focal lesion identified. Within normal limits in parenchymal echogenicity. Portal vein is patent on color Doppler imaging with normal direction of blood flow towards the liver. Other: None. IMPRESSION: Markedly dilated common bile duct and dilated intrahepatic bile ducts. See same day CT of the abdomen and pelvis for further evaluation. Electronically Signed   By: Yetta Glassman M.D.   On: 09/29/2021 17:39        Scheduled Meds:  [MAR Hold] amLODipine  2.5 mg Oral BID   [MAR Hold] cholecalciferol  1,000 Units Oral Daily   [MAR Hold] docusate sodium  100 mg Oral BID   [MAR Hold] pantoprazole  40 mg Oral Daily   [MAR Hold] tamoxifen  20 mg Oral Daily   Continuous Infusions:  sodium chloride     lactated ringers 100 mL/hr at 10/01/21 0223   [MAR Hold] piperacillin-tazobactam (ZOSYN)  IV 3.375 g (10/01/21 0549)   [MAR Hold] potassium chloride 10 mEq (10/01/21 0900)     LOS: 1 day       Phillips Climes, MD [facility]   To contact the attending provider between 7A-7P or the covering provider during after hours 7P-7A, please log into the web site [email or web address] and access using universal [place] password for that web site. If you do not have the password, please call the hospital operator.  10/01/2021, 10:26 AM

## 2021-10-01 NOTE — Anesthesia Preprocedure Evaluation (Signed)
Anesthesia Evaluation  Patient identified by MRN, date of birth, ID band Patient awake    Reviewed: Allergy & Precautions, H&P , NPO status , Patient's Chart, lab work & pertinent test results  Airway Mallampati: II   Neck ROM: full    Dental   Pulmonary    breath sounds clear to auscultation       Cardiovascular hypertension,  Rhythm:regular Rate:Normal     Neuro/Psych    GI/Hepatic PUD, GERD  ,Cholangitis   Endo/Other    Renal/GU      Musculoskeletal  (+) Arthritis ,   Abdominal   Peds  Hematology   Anesthesia Other Findings   Reproductive/Obstetrics                             Anesthesia Physical Anesthesia Plan  ASA: 3  Anesthesia Plan: General   Post-op Pain Management:    Induction: Intravenous  PONV Risk Score and Plan: 3 and Ondansetron, Dexamethasone and Treatment may vary due to age or medical condition  Airway Management Planned: Oral ETT  Additional Equipment:   Intra-op Plan:   Post-operative Plan: Extubation in OR  Informed Consent: I have reviewed the patients History and Physical, chart, labs and discussed the procedure including the risks, benefits and alternatives for the proposed anesthesia with the patient or authorized representative who has indicated his/her understanding and acceptance.     Dental advisory given  Plan Discussed with: CRNA, Anesthesiologist and Surgeon  Anesthesia Plan Comments:         Anesthesia Quick Evaluation

## 2021-10-01 NOTE — Op Note (Signed)
[facility] Patient Name: Valerie Cooper Procedure Date : 10/01/2021 MRN: [id number] Attending MD: Ronnette Juniper , MD Date of Birth: 06-Jan-1934 CSN: [id number] Age: 88 Admit Type: Inpatient Procedure:                ERCP Indications:              Common bile duct stone(s), Bile duct stone on                            Computed Tomogram Scan, Suspected ascending                            cholangitis, Elevated liver enzymes Providers:                Ronnette Juniper, MD, Grace Isaac, RN, Doristine Johns,                            RN Referring MD:             [facility] Medicines:                Monitored Anesthesia Care Complications:            No immediate complications. Estimated Blood Loss:     Estimated blood loss: none. Procedure:                Pre-Anesthesia Assessment:                           - Prior to the procedure, a History and Physical                            was performed, and patient medications and                            allergies were reviewed. The patient's tolerance of                            previous anesthesia was also reviewed. The risks                            and benefits of the procedure and the sedation                            options and risks were discussed with the patient.                            All questions were answered, and informed consent                            was obtained. Prior Anticoagulants: The patient has                            taken no previous anticoagulant or antiplatelet                            agents. ASA  Grade Assessment: III - A patient with                            severe systemic disease. After reviewing the risks                            and benefits, the patient was deemed in                            satisfactory condition to undergo the procedure.                           After obtaining informed consent, the scope was                            passed under direct vision.  Throughout the                            procedure, the patient's blood pressure, pulse, and                            oxygen saturations were monitored continuously. The                            [id number]-Q190V ([id number] Olympus duodenoscope was                            introduced through the mouth, and used to inject                            contrast into and used to inject contrast into the                            bile duct. The ERCP was accomplished without                            difficulty. The patient tolerated the procedure                            well. Scope In: Scope Out: Findings:      A scout film of the abdomen was obtained. Surgical clips, consistent       with a previous cholecystectomy, were seen in the area of the right       upper quadrant of the abdomen. The esophagus was successfully intubated       under direct vision. The scope was advanced to a normal major papilla in       the descending duodenum without detailed examination of the pharynx,       larynx and associated structures, and upper GI tract. The upper GI tract       was grossly normal.      The ampulla was noted at the rim of a medium sized diverticulum.      The bile duct was deeply cannulated with the sphincterotome. Contrast       was injected. I personally interpreted the  bile duct images. There was       brisk flow of contrast through the ducts. Image quality was excellent.       Contrast extended to the main bile duct.      The lower third of the main bile duct and middle third of the main bile       duct contained stones. The upper third of the main bile duct and       intrahepatics were markedly dilated. The largest diameter of upper CBD       was 20 mm.      A straight Roadrunner wire was passed into the biliary tree. A 10 mm       biliary sphincterotomy was made with a braided sphincterotome using ERBE       electrocautery. There was no post-sphincterotomy bleeding.      The biliary  tree was swept with a 9/12 mm and a 12/15 mm balloon       starting at the bifurcation. A large amount of sludge and a small amount       of pus was swept from the duct.      A large stone was noted in the distal CBD close to the sphincteromoty       site but could not be removed with the balloon.      A 1.5 cm trapezoid retreival basket was used to retrieve the largest       stone.      After mulitple sweeps two large stones were also removed subsequently.      Total three large stones were removed. No stones remained.      The pancreatic duct was not canulated or injected intentionally. Impression:               - The upper third of the main bile duct was                            markedly dilated.                           - Choledocholithiasis was found. Complete removal                            was accomplished by biliary sphincterotomy and                            balloon and basket extraction.                           - A biliary sphincterotomy was performed.                           - The biliary tree was swept. Moderate Sedation:      Patient did not receive moderate sedation for this procedure, but       instead received monitored anesthesia care. Recommendation:           - Advance diet as tolerated.                           - Hopefully can be discharged in am, will need 7  days of oral antibiotics as an outpatient. Procedure Code(s):        --- Professional ---                           [phone], Endoscopic retrograde                            cholangiopancreatography (ERCP); with removal of                            calculi/debris from biliary/pancreatic duct(s)                           43262, Endoscopic retrograde                            cholangiopancreatography (ERCP); with                            sphincterotomy/papillotomy                           [phone], Endoscopic catheterization of the biliary                            ductal  system, radiological supervision and                            interpretation Diagnosis Code(s):        --- Professional ---                           K80.50, Calculus of bile duct without cholangitis                            or cholecystitis without obstruction                           R74.8, Abnormal levels of other serum enzymes                           K83.8, Other specified diseases of biliary tract CPT copyright 2019 American Medical Association. All rights reserved. The codes documented in this report are preliminary and upon coder review may  be revised to meet current compliance requirements. Ronnette Juniper, MD 10/01/2021 10:32:35 AM This report has been signed electronically. Number of Addenda: 0

## 2021-10-01 NOTE — Interval H&P Note (Signed)
History and Physical Interval Note: 88/female with suspected cholangitis, CBD stone, abnormal LFTs and leucocytosis for ERCP.  10/01/2021 9:17 AM  Valerie Cooper  has presented today for ERCP, with the diagnosis of Cholangitis.  The various methods of treatment have been discussed with the patient and family. After consideration of risks, benefits and other options for treatment, the patient has consented to  Procedure(s): ENDOSCOPIC RETROGRADE CHOLANGIOPANCREATOGRAPHY (ERCP) WITH PROPOFOL (N/A) as a surgical intervention.  The patient's history has been reviewed, patient examined, no change in status, stable for surgery.  I have reviewed the patient's chart and labs.  Questions were answered to the patient's satisfaction.     Ronnette Juniper

## 2021-10-01 NOTE — Progress Notes (Signed)
[facility] night cross cover note:  I was notified by inpatient pharmacist, Narda Bonds, [facility], that this patient, who was admitted on 09/30/2021 for potential ascending cholangitis, has blood cultures positive for E. coli.  Consequently, inpatient pharmacist discontinuing existing IV vancomycin, while continuing existing Zosyn.     Babs Bertin, DO Hospitalist

## 2021-10-01 NOTE — Plan of Care (Signed)

## 2021-10-01 NOTE — Anesthesia Procedure Notes (Signed)
Procedure Name: Intubation Date/Time: 10/01/2021 9:33 AM  Performed by: Lowella Dell, CRNAPre-anesthesia Checklist: Patient identified, Emergency Drugs available, Suction available and Patient being monitored Patient Re-evaluated:Patient Re-evaluated prior to induction Oxygen Delivery Method: Circle System Utilized Preoxygenation: Pre-oxygenation with 100% oxygen Induction Type: IV induction Ventilation: Mask ventilation without difficulty and Oral airway inserted - appropriate to patient size Laryngoscope Size: Mac and 3 Grade View: Grade I Tube type: Oral Tube size: 7.0 mm Number of attempts: 1 Airway Equipment and Method: Stylet Placement Confirmation: ETT inserted through vocal cords under direct vision, positive ETCO2 and breath sounds checked- equal and bilateral Secured at: 21 cm Tube secured with: Tape Dental Injury: Teeth and Oropharynx as per pre-operative assessment

## 2021-10-01 NOTE — Progress Notes (Signed)
PHARMACY - PHYSICIAN COMMUNICATION CRITICAL VALUE ALERT - BLOOD CULTURE IDENTIFICATION (BCID)  Valerie Cooper is an 88 y.o. female who presented to [facility] on 09/29/2021 with a chief complaint of cholangitis   Assessment: Afebrile, WBC 29.5>>24.1  Name of physician (or Provider) Contacted: Dr. Velia Meyer  Current antibiotics: Vancomycin/Zosyn  Changes to prescribed antibiotics recommended:  DC Vancomycin  Continue Zosyn  Results for orders placed or performed during the hospital encounter of 09/29/21  Blood Culture ID Panel (Reflexed) (Collected: 09/29/2021  6:12 PM)  Result Value Ref Range   Enterococcus faecalis NOT DETECTED NOT DETECTED   Enterococcus Faecium NOT DETECTED NOT DETECTED   Listeria monocytogenes NOT DETECTED NOT DETECTED   Staphylococcus species NOT DETECTED NOT DETECTED   Staphylococcus aureus (BCID) NOT DETECTED NOT DETECTED   Staphylococcus epidermidis NOT DETECTED NOT DETECTED   Staphylococcus lugdunensis NOT DETECTED NOT DETECTED   Streptococcus species NOT DETECTED NOT DETECTED   Streptococcus agalactiae NOT DETECTED NOT DETECTED   Streptococcus pneumoniae NOT DETECTED NOT DETECTED   Streptococcus pyogenes NOT DETECTED NOT DETECTED   A.calcoaceticus-baumannii NOT DETECTED NOT DETECTED   Bacteroides fragilis NOT DETECTED NOT DETECTED   Enterobacterales DETECTED (A) NOT DETECTED   Enterobacter cloacae complex NOT DETECTED NOT DETECTED   Escherichia coli DETECTED (A) NOT DETECTED   Klebsiella aerogenes NOT DETECTED NOT DETECTED   Klebsiella oxytoca NOT DETECTED NOT DETECTED   Klebsiella pneumoniae NOT DETECTED NOT DETECTED   Proteus species NOT DETECTED NOT DETECTED   Salmonella species NOT DETECTED NOT DETECTED   Serratia marcescens NOT DETECTED NOT DETECTED   Haemophilus influenzae NOT DETECTED NOT DETECTED   Neisseria meningitidis NOT DETECTED NOT DETECTED   Pseudomonas aeruginosa NOT DETECTED NOT DETECTED   Stenotrophomonas maltophilia NOT DETECTED  NOT DETECTED   Candida albicans NOT DETECTED NOT DETECTED   Candida auris NOT DETECTED NOT DETECTED   Candida glabrata NOT DETECTED NOT DETECTED   Candida krusei NOT DETECTED NOT DETECTED   Candida parapsilosis NOT DETECTED NOT DETECTED   Candida tropicalis NOT DETECTED NOT DETECTED   Cryptococcus neoformans/gattii NOT DETECTED NOT DETECTED   CTX-M ESBL NOT DETECTED NOT DETECTED   Carbapenem resistance IMP NOT DETECTED NOT DETECTED   Carbapenem resistance KPC NOT DETECTED NOT DETECTED   Carbapenem resistance NDM NOT DETECTED NOT DETECTED   Carbapenem resist OXA 48 LIKE NOT DETECTED NOT DETECTED   Carbapenem resistance VIM NOT DETECTED NOT DETECTED    Narda Bonds 10/01/2021  2:21 AM

## 2021-10-01 NOTE — Transfer of Care (Signed)
Immediate Anesthesia Transfer of Care Note  Patient: ROYAL BEIRNE  Procedure(s) Performed: ENDOSCOPIC RETROGRADE CHOLANGIOPANCREATOGRAPHY (ERCP) WITH PROPOFOL SPHINCTEROTOMY REMOVAL OF STONES STONE EXTRACTION WITH BASKET  Patient Location: PACU  Anesthesia Type:General  Level of Consciousness: awake and patient cooperative  Airway & Oxygen Therapy: Patient Spontanous Breathing and Patient connected to nasal cannula oxygen  Post-op Assessment: Report given to RN and Post -op Vital signs reviewed and stable  Post vital signs: Reviewed and stable  Last Vitals:  Vitals Value Taken Time  BP 129/63 10/01/21 1045  Temp 36.8 C 10/01/21 1045  Pulse 70 10/01/21 1054  Resp 14 10/01/21 1054  SpO2 98 % 10/01/21 1054  Vitals shown include unvalidated device data.  Last Pain:  Vitals:   10/01/21 0902  TempSrc: Oral  PainSc: 0-No pain         Complications: No notable events documented.

## 2021-10-01 NOTE — Evaluation (Signed)
Physical Therapy Evaluation & Discharge Patient Details Name: Valerie Cooper MRN: [id number] DOB: 1934-03-30 Today's Date: 10/01/2021  History of Present Illness  88 y/o female presented to [place] ED on 09/29/21 for nausea, vomiting, and diarrhea. CT abdomen revealed postcholecystectomy state but severe biliary duct dialation at 1.4 cm with what appeared to be distal obstruction. S/p ERCP on 6/15. PMH: HTN, R breast cancer s/p surgery and radiation, osteoporosis  Clinical Impression  Patient admitted with the above. Patient currently functioning at [place] level which is baseline for her. PTA, patient lives alone and was very independent. Daughter is here from [place] and will be staying with her until at least July 4. Encouraged patient to ambulate in hallways to assist with improving activity tolerance. No further skilled PT needs identified acutely. No PT follow up recommended at this time. PT will sign off.        Recommendations for follow up therapy are one component of a multi-disciplinary discharge planning process, led by the attending physician.  Recommendations may be updated based on patient status, additional functional criteria and insurance authorization.  Follow Up Recommendations No PT follow up    Assistance Recommended at Discharge PRN  Patient can return home with the following  Assistance with cooking/housework;Assist for transportation    Equipment Recommendations None recommended by PT  Recommendations for Other Services       Functional Status Assessment Patient has had a recent decline in their functional status and demonstrates the ability to make significant improvements in function in a reasonable and predictable amount of time.     Precautions / Restrictions Precautions Precautions: None Restrictions Weight Bearing Restrictions: No      Mobility  Bed Mobility Overal bed mobility: Modified Independent                  Transfers Overall transfer  level: Modified independent Equipment used: None                    Ambulation/Gait Ambulation/Gait assistance: Modified independent (Device/Increase time) Gait Distance (Feet): 250 Feet Assistive device: None Gait Pattern/deviations: WFL(Within Functional Limits)   Gait velocity interpretation: >2.62 ft/sec, indicative of community ambulatory      Stairs            Wheelchair Mobility    Modified Rankin (Stroke Patients Only)       Balance Overall balance assessment: Mild deficits observed, not formally tested                                           Pertinent Vitals/Pain Pain Assessment Pain Assessment: No/denies pain    Home Living Family/patient expects to be discharged to:: Private residence Living Arrangements: Alone Available Help at Discharge: Family (daughter is here from [place] and will be here until July 4) Type of Home: House Home Access: Stairs to enter Entrance Stairs-Rails: Psychiatric nurse of Steps: 2-3   Home Layout: One level Home Equipment: Grab bars - tub/shower;Grab bars - toilet      Prior Function Prior Level of Function : Independent/Modified Independent;Driving                     Hand Dominance        Extremity/Trunk Assessment   Upper Extremity Assessment Upper Extremity Assessment: Overall WFL for tasks assessed    Lower Extremity Assessment Lower Extremity Assessment:  Overall [facility] for tasks assessed    Cervical / Trunk Assessment Cervical / Trunk Assessment: Normal  Communication   Communication: No difficulties  Cognition Arousal/Alertness: Awake/alert Behavior During Therapy: WFL for tasks assessed/performed Overall Cognitive Status: Within Functional Limits for tasks assessed                                          General Comments      Exercises     Assessment/Plan    PT Assessment Patient does not need any further PT services  PT  Problem List         PT Treatment Interventions      PT Goals (Current goals can be found in the Care Plan section)  Acute Rehab PT Goals Patient Stated Goal: to go home tomorrow PT Goal Formulation: All assessment and education complete, DC therapy    Frequency       Co-evaluation               AM-PAC PT "6 Clicks" Mobility  Outcome Measure Help needed turning from your back to your side while in a flat bed without using bedrails?: None Help needed moving from lying on your back to sitting on the side of a flat bed without using bedrails?: None Help needed moving to and from a bed to a chair (including a wheelchair)?: None Help needed standing up from a chair using your arms (e.g., wheelchair or bedside chair)?: None Help needed to walk in hospital room?: None Help needed climbing 3-5 steps with a railing? : None 6 Click Score: 24    End of Session   Activity Tolerance: Patient tolerated treatment well Patient left: in bed;with call bell/phone within reach;with family/visitor present Nurse Communication: Mobility status PT Visit Diagnosis: Muscle weakness (generalized) (M62.81)    Time: 4503-8882 PT Time Calculation (min) (ACUTE ONLY): 24 min   Charges:   PT Evaluation $PT Eval Low Complexity: 1 Low PT Treatments $Therapeutic Activity: 8-22 mins        Aricka Goldberger A. Gilford Rile PT, DPT Acute Rehabilitation Services Office [phone]   Linna Hoff 10/01/2021, 4:59 PM

## 2021-10-02 DIAGNOSIS — K8050 Calculus of bile duct without cholangitis or cholecystitis without obstruction: Secondary | ICD-10-CM | POA: Diagnosis not present

## 2021-10-02 DIAGNOSIS — E785 Hyperlipidemia, unspecified: Secondary | ICD-10-CM | POA: Diagnosis not present

## 2021-10-02 DIAGNOSIS — K8309 Other cholangitis: Secondary | ICD-10-CM | POA: Diagnosis not present

## 2021-10-02 LAB — COMPREHENSIVE METABOLIC PANEL
ALT: 56 U/L — ABNORMAL HIGH (ref 0–44)
AST: 23 U/L (ref 15–41)
Albumin: 2.3 g/dL — ABNORMAL LOW (ref 3.5–5.0)
Alkaline Phosphatase: 111 U/L (ref 38–126)
Anion gap: 9 (ref 5–15)
BUN: 16 mg/dL (ref 8–23)
CO2: 22 mmol/L (ref 22–32)
Calcium: 8.4 mg/dL — ABNORMAL LOW (ref 8.9–10.3)
Chloride: 107 mmol/L (ref 98–111)
Creatinine, Ser: 1.28 mg/dL — ABNORMAL HIGH (ref 0.44–1.00)
GFR, Estimated: 41 mL/min — ABNORMAL LOW (ref >60)
Glucose, Bld: 125 mg/dL — ABNORMAL HIGH (ref 70–99)
Potassium: 3.8 mmol/L (ref 3.5–5.1)
Sodium: 138 mmol/L (ref 135–145)
Total Bilirubin: 1.1 mg/dL (ref 0.3–1.2)
Total Protein: 5.9 g/dL — ABNORMAL LOW (ref 6.5–8.1)

## 2021-10-02 LAB — CBC
HCT: 30.8 % — ABNORMAL LOW (ref 36.0–46.0)
Hemoglobin: 10.4 g/dL — ABNORMAL LOW (ref 12.0–15.0)
MCH: 31.4 pg (ref 26.0–34.0)
MCHC: 33.8 g/dL (ref 30.0–36.0)
MCV: 93.1 fL (ref 80.0–100.0)
Platelets: 202 10*3/uL (ref 150–400)
RBC: 3.31 MIL/uL — ABNORMAL LOW (ref 3.87–5.11)
RDW: 13.4 % (ref 11.5–15.5)
WBC: 18.2 10*3/uL — ABNORMAL HIGH (ref 4.0–10.5)
nRBC: 0 % (ref 0.0–0.2)

## 2021-10-02 MED ORDER — HEPARIN SODIUM (PORCINE) 5000 UNIT/ML IJ SOLN
5000.0000 [IU] | Freq: Three times a day (TID) | INTRAMUSCULAR | Status: DC
Start: 2021-10-02 — End: 2021-10-03
  Administered 2021-10-02 – 2021-10-03 (×3): 5000 [IU] via SUBCUTANEOUS
  Filled 2021-10-02 (×3): qty 1

## 2021-10-02 NOTE — Progress Notes (Signed)
PROGRESS NOTE    Valerie Cooper  ZOX:[id number] DOB: Jun 25, 1933 DOA: 09/29/2021 PCP: [name], MD   Chief Complaint  Patient presents with   Emesis    Brief Narrative:   Valerie Cooper is a 88 y.o. 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 -Her work-up significant for elevated LFTs, leukocytosis, with clinical suspicion of cholangitis, patient was transferred from [facility] to [facility], for ERCP by GI. - Status post ERCP with sphincterotomy, balloon sweep and basket extraction of large CBD stones and sludge by Dr. Deno Etienne on 6/15.    Assessment & Plan:   Active Problems:   Symptomatic cholelithiasis   PUD (peptic ulcer disease) in distant past   HTN (hypertension)   HLD (hyperlipidemia)   Malignant neoplasm of upper-inner quadrant of right breast in female, estrogen receptor positive ([place])   Acute cholangitis  Choledocholithiasis Cholangitis E coli bacteremia Transaminitis Jaundice/hyperbilirubinemia -Status post cholecystostomy. -Second for hyperbilirubinemia, transaminitis, imaging suspicious for dilated common bile duct. -Blood culture growing E. coli, follow on susceptibilities, meanwhile continue with IV Zosyn, appears to be improving, nontoxic-appearing. -GI input greatly appreciated, status post ERCP with common bile duct stone noted and proximal third dilated, status post Complete removal was accomplished by biliary sphincterotomy and balloon and basket extraction,   biliary sphincterotomy was performed.The biliary tree was swept. -Leukocytosis trending down, it is 18.2 today, will continue with IV Zosyn for another 24 hours, hopefully can be discharged tomorrow oral Augmentin if white blood cell continues to trend down. -Total bili improving, LFTs almost normalized as  well.  Hypokalemia -Repleted, likely will need to be discharged on potassium supplements.  History of peptic ulcer disease: Resume PPI   AKI on CKD stage IIIa: Patient did have elevated creatinine 1.58 on presentation from baseline 1.1.  Improving with hydration to 1.3 today.   Hyperlipidemia: Hold statins in view of problem #3    Breast cancer: S/p surgery and radiation.  Follows oncology in community and was recently started on tamoxifen.  Patient wants to discuss with her primary oncologist before resuming this medication.   Hypertension: Resume home meds    DVT prophylaxis: SCD, subcu heparin Code Status: Full Family Communication: D/W daughter at bedside Disposition:   Status is: Inpatient Remains inpatient appropriate because: Continue with IV antibiotics for another 24 hours given she still with significant leukocytosis.   Consultants:  GI   Subjective:  Abdominal pain, no nausea, no vomiting, she denies any complaints today  Objective: Vitals:   10/01/21 1939 10/01/21 2000 10/02/21 0045 10/02/21 0337  BP: 111/64  (!) 105/58 131/69  Pulse:  76 61 69  Resp:  (!) '21 16 15  '$ Temp: 97.7 F (36.5 C)  97.9 F (36.6 C) 98.1 F (36.7 C)  TempSrc: Oral  Oral Oral  SpO2: 98%  97% 98%  Weight:      Height:        Intake/Output Summary (Last 24 hours) at 10/02/2021 1020 Last data filed at 10/02/2021 0649 Gross per 24 hour  Intake 2127.62 ml  Output --  Net 2127.62 ml   Filed Weights   09/29/21 1535  Weight: 66.5 kg    Examination:  Awake Alert, Oriented X 3, No new F.N deficits, Normal affect Symmetrical Chest wall movement, Good air movement bilaterally, CTAB RRR,No Gallops,Rubs or new Murmurs, No Parasternal Heave +  ve B.Sounds, Abd Soft, No tenderness, No rebound - guarding or rigidity. No Cyanosis, Clubbing or edema, No new Rash or bruise       Data Reviewed: I have personally reviewed following labs and imaging studies  CBC: Recent Labs  Lab  09/29/21 1550 09/30/21 1319 10/01/21 0054 10/02/21 0113  WBC 32.1* 29.5* 24.1* 18.2*  NEUTROABS 29.3* 27.7*  --   --   HGB 13.0 12.4 10.7* 10.4*  HCT 38.7 35.9* 30.4* 30.8*  MCV 93.9 93.0 92.1 93.1  PLT 116* 191 182 979    Basic Metabolic Panel: Recent Labs  Lab 09/29/21 1550 09/30/21 1319 10/01/21 0054 10/02/21 0113  NA 132* 136 139 138  K 4.3 3.2* 2.9* 3.8  CL 94* 102 105 107  CO2 '25 22 25 22  '$ GLUCOSE 85 74 85 125*  BUN 34* 26* 20 16  CREATININE 1.56* 1.31* 1.17* 1.28*  CALCIUM 9.8 8.8* 8.4* 8.4*    GFR: Estimated Creatinine Clearance: 27.5 mL/min (A) (by C-G formula based on SCr of 1.28 mg/dL (H)).  Liver Function Tests: Recent Labs  Lab 09/29/21 1550 09/30/21 1319 10/01/21 0054 10/02/21 0113  AST 124* 49* 31 23  ALT 170* 103* 76* 56*  ALKPHOS 164* 139* 129* 111  BILITOT 5.3* 2.2* 1.6* 1.1  PROT 8.3* 6.8 6.2* 5.9*  ALBUMIN 4.0 2.9* 2.5* 2.3*    CBG: No results for input(s): "GLUCAP" in the last 168 hours.   Recent Results (from the past 240 hour(s))  Culture, blood (routine x 2)     Status: Abnormal (Preliminary result)   Collection Time: 09/29/21  6:12 PM   Specimen: BLOOD  Result Value Ref Range Status   Specimen Description   Final    BLOOD LEFT ANTECUBITAL Performed at [facility], [address] [place], [place] [address]    Special Requests   Final    BOTTLES DRAWN AEROBIC AND ANAEROBIC Blood Culture adequate volume Performed at [facility], [address] [place], [place] [address]    Culture  Setup Time   Final    GRAM NEGATIVE RODS AEROBIC BOTTLE ONLY CRITICAL RESULT CALLED TO, READ BACK BY AND VERIFIED WITH: PHARMD JAMES LEDFORD 10/01/21'@1'$ :54 BY TW    Culture (A)  Final    ESCHERICHIA COLI SUSCEPTIBILITIES TO FOLLOW Performed at [facility], [place] [address] [place], [place] [address]    Report Status PENDING  Incomplete  Blood Culture ID Panel (Reflexed)     Status: Abnormal    Collection Time: 09/29/21  6:12 PM  Result Value Ref Range Status   Enterococcus faecalis NOT DETECTED NOT DETECTED Final   Enterococcus Faecium NOT DETECTED NOT DETECTED Final   Listeria monocytogenes NOT DETECTED NOT DETECTED Final   Staphylococcus species NOT DETECTED NOT DETECTED Final   Staphylococcus aureus (BCID) NOT DETECTED NOT DETECTED Final   Staphylococcus epidermidis NOT DETECTED NOT DETECTED Final   Staphylococcus lugdunensis NOT DETECTED NOT DETECTED Final   Streptococcus species NOT DETECTED NOT DETECTED Final   Streptococcus agalactiae NOT DETECTED NOT DETECTED Final   Streptococcus pneumoniae NOT DETECTED NOT DETECTED Final   Streptococcus pyogenes NOT DETECTED NOT DETECTED Final   A.calcoaceticus-baumannii NOT DETECTED NOT DETECTED Final   Bacteroides fragilis NOT DETECTED NOT DETECTED Final   Enterobacterales DETECTED (A) NOT DETECTED Final    Comment: Enterobacterales represent a large order of gram negative bacteria, not a single organism. CRITICAL RESULT CALLED TO, READ BACK BY AND VERIFIED WITH: PHARMD JAMES LEDFORD 10/01/21'@1'$ :54 BY TW  Enterobacter cloacae complex NOT DETECTED NOT DETECTED Final   Escherichia coli DETECTED (A) NOT DETECTED Final    Comment: CRITICAL RESULT CALLED TO, READ BACK BY AND VERIFIED WITH: PHARMD JAMES LEDFORD 10/01/21'@1'$ :54 BY TW    Klebsiella aerogenes NOT DETECTED NOT DETECTED Final   Klebsiella oxytoca NOT DETECTED NOT DETECTED Final   Klebsiella pneumoniae NOT DETECTED NOT DETECTED Final   Proteus species NOT DETECTED NOT DETECTED Final   Salmonella species NOT DETECTED NOT DETECTED Final   Serratia marcescens NOT DETECTED NOT DETECTED Final   Haemophilus influenzae NOT DETECTED NOT DETECTED Final   Neisseria meningitidis NOT DETECTED NOT DETECTED Final   Pseudomonas aeruginosa NOT DETECTED NOT DETECTED Final   Stenotrophomonas maltophilia NOT DETECTED NOT DETECTED Final   Candida albicans NOT DETECTED NOT DETECTED Final    Candida auris NOT DETECTED NOT DETECTED Final   Candida glabrata NOT DETECTED NOT DETECTED Final   Candida krusei NOT DETECTED NOT DETECTED Final   Candida parapsilosis NOT DETECTED NOT DETECTED Final   Candida tropicalis NOT DETECTED NOT DETECTED Final   Cryptococcus neoformans/gattii NOT DETECTED NOT DETECTED Final   CTX-M ESBL NOT DETECTED NOT DETECTED Final   Carbapenem resistance IMP NOT DETECTED NOT DETECTED Final   Carbapenem resistance KPC NOT DETECTED NOT DETECTED Final   Carbapenem resistance NDM NOT DETECTED NOT DETECTED Final   Carbapenem resist OXA 48 LIKE NOT DETECTED NOT DETECTED Final   Carbapenem resistance VIM NOT DETECTED NOT DETECTED Final    Comment: Performed at [facility], [address] [place], [place] [address]  Culture, blood (routine x 2)     Status: None (Preliminary result)   Collection Time: 09/29/21  6:17 PM   Specimen: BLOOD  Result Value Ref Range Status   Specimen Description   Final    BLOOD BLOOD RIGHT FOREARM Performed at [facility], [address] [place], [place] [address]    Special Requests   Final    Blood Culture adequate volume BOTTLES DRAWN AEROBIC AND ANAEROBIC Performed at [facility], [address], [place], [place] [address]    Culture   Final    NO GROWTH 2 DAYS Performed at [facility], [place] [address], [place], [place] [address]    Report Status PENDING  Incomplete         Radiology Studies: DG ERCP  Result Date: 10/01/2021 CLINICAL DATA:  Abdominal pain. Suspected choledocholithiasis on recent CT. EXAM: ERCP TECHNIQUE: Multiple spot images obtained with the fluoroscopic device and submitted for interpretation post-procedure. COMPARISON:  CT 09/29/2021 and previous FINDINGS: A series of fluoroscopic spot images document endoscopic cannulation and opacification of the CBD with passage of balloon catheter through the distal CBD. The CBD is dilated. Incomplete  opacification of the intrahepatic biliary tree, which appears mildly dilated centrally. Cholecystectomy clips are noted. No extravasation. IMPRESSION: Endoscopic CBD cannulation and intervention as above. These images were submitted for radiologic interpretation only. Please see the procedural report for the amount of contrast and the fluoroscopy time utilized. Electronically Signed   By: Lucrezia Europe M.D.   On: 10/01/2021 11:32        Scheduled Meds:  amLODipine  2.5 mg Oral BID   cholecalciferol  1,000 Units Oral Daily   docusate sodium  100 mg Oral BID   heparin injection (subcutaneous)  5,000 Units Subcutaneous Q8H   pantoprazole  40 mg Oral Daily   tamoxifen  20 mg Oral Daily   Continuous Infusions:  lactated  ringers 100 mL/hr at 10/02/21 0949   piperacillin-tazobactam (ZOSYN)  IV 12.5 mL/hr at 10/02/21 0649     LOS: 2 days       Phillips Climes, MD [facility]   To contact the attending provider between 7A-7P or the covering provider during after hours 7P-7A, please log into the web site [email or web address] and access using universal [place] password for that web site. If you do not have the password, please call the hospital operator.  10/02/2021, 10:20 AM

## 2021-10-02 NOTE — Progress Notes (Signed)
Subjective: Patient was seen and examined at bedside in presence of her daughter Ivin Booty. Denies abdominal pain, denies nausea or vomiting. Has been tolerating solids without any issues.  Objective: Vital signs in last 24 hours: Temp:  [97.7 F (36.5 C)-98.5 F (36.9 C)] 98.1 F (36.7 C) (06/16 0337) Pulse Rate:  [61-80] 69 (06/16 0337) Resp:  [14-21] 15 (06/16 0337) BP: (105-138)/(58-69) 131/69 (06/16 0337) SpO2:  [95 %-99 %] 98 % (06/16 0337) Weight change:  Last BM Date : 09/30/21  PE: Elderly, no icterus, mild pallor GENERAL: Awake, oriented x3, not in distress  ABDOMEN: Soft, nondistended, nontender abdomen EXTREMITIES: No deformity  Lab Results: Results for orders placed or performed during the hospital encounter of 09/29/21 (from the past 48 hour(s))  CBC with Differential     Status: Abnormal   Collection Time: 09/30/21  1:19 PM  Result Value Ref Range   WBC 29.5 (H) 4.0 - 10.5 K/uL   RBC 3.86 (L) 3.87 - 5.11 MIL/uL   Hemoglobin 12.4 12.0 - 15.0 g/dL   HCT 35.9 (L) 36.0 - 46.0 %   MCV 93.0 80.0 - 100.0 fL   MCH 32.1 26.0 - 34.0 pg   MCHC 34.5 30.0 - 36.0 g/dL   RDW 13.5 11.5 - 15.5 %   Platelets 191 150 - 400 K/uL   nRBC 0.0 0.0 - 0.2 %   Neutrophils Relative % 94 %   Neutro Abs 27.7 (H) 1.7 - 7.7 K/uL   Lymphocytes Relative 5 %   Lymphs Abs 1.5 0.7 - 4.0 K/uL   Monocytes Relative 1 %   Monocytes Absolute 0.3 0.1 - 1.0 K/uL   Eosinophils Relative 0 %   Eosinophils Absolute 0.0 0.0 - 0.5 K/uL   Basophils Relative 0 %   Basophils Absolute 0.0 0.0 - 0.1 K/uL   nRBC 0 0 /100 WBC   Abs Immature Granulocytes 0.00 0.00 - 0.07 K/uL    Comment: Performed at [facility], [address], [place], [place] [address]  Comprehensive metabolic panel     Status: Abnormal   Collection Time: 09/30/21  1:19 PM  Result Value Ref Range   Sodium 136 135 - 145 mmol/L   Potassium 3.2 (L) 3.5 - 5.1 mmol/L   Chloride 102 98 - 111 mmol/L   CO2 22 22 - 32 mmol/L   Glucose,  Bld 74 70 - 99 mg/dL    Comment: Glucose reference range applies only to samples taken after fasting for at least 8 hours.   BUN 26 (H) 8 - 23 mg/dL   Creatinine, Ser 1.31 (H) 0.44 - 1.00 mg/dL   Calcium 8.8 (L) 8.9 - 10.3 mg/dL   Total Protein 6.8 6.5 - 8.1 g/dL   Albumin 2.9 (L) 3.5 - 5.0 g/dL   AST 49 (H) 15 - 41 U/L   ALT 103 (H) 0 - 44 U/L   Alkaline Phosphatase 139 (H) 38 - 126 U/L   Total Bilirubin 2.2 (H) 0.3 - 1.2 mg/dL   GFR, Estimated 40 (L) >60 mL/min    Comment: (NOTE) Calculated using the CKD-EPI Creatinine Equation (2021)    Anion gap 12 5 - 15    Comment: Performed at [facility], [place] [address], [place], [place] [address]  Comprehensive metabolic panel     Status: Abnormal   Collection Time: 10/01/21 12:54 AM  Result Value Ref Range   Sodium 139 135 - 145 mmol/L   Potassium 2.9 (L) 3.5 - 5.1 mmol/L  Chloride 105 98 - 111 mmol/L   CO2 25 22 - 32 mmol/L   Glucose, Bld 85 70 - 99 mg/dL    Comment: Glucose reference range applies only to samples taken after fasting for at least 8 hours.   BUN 20 8 - 23 mg/dL   Creatinine, Ser 1.17 (H) 0.44 - 1.00 mg/dL   Calcium 8.4 (L) 8.9 - 10.3 mg/dL   Total Protein 6.2 (L) 6.5 - 8.1 g/dL   Albumin 2.5 (L) 3.5 - 5.0 g/dL   AST 31 15 - 41 U/L   ALT 76 (H) 0 - 44 U/L   Alkaline Phosphatase 129 (H) 38 - 126 U/L   Total Bilirubin 1.6 (H) 0.3 - 1.2 mg/dL   GFR, Estimated 45 (L) >60 mL/min    Comment: (NOTE) Calculated using the CKD-EPI Creatinine Equation (2021)    Anion gap 9 5 - 15    Comment: Performed at [facility], [place] [address], [place], [place] [address]  CBC     Status: Abnormal   Collection Time: 10/01/21 12:54 AM  Result Value Ref Range   WBC 24.1 (H) 4.0 - 10.5 K/uL   RBC 3.30 (L) 3.87 - 5.11 MIL/uL   Hemoglobin 10.7 (L) 12.0 - 15.0 g/dL   HCT 30.4 (L) 36.0 - 46.0 %   MCV 92.1 80.0 - 100.0 fL   MCH 32.4 26.0 - 34.0 pg   MCHC 35.2 30.0 - 36.0 g/dL   RDW 13.4 11.5 - 15.5 %   Platelets 182 150 -  400 K/uL   nRBC 0.0 0.0 - 0.2 %    Comment: Performed at [facility], [place] [address] [place], [place] [address]  CBC     Status: Abnormal   Collection Time: 10/02/21  1:13 AM  Result Value Ref Range   WBC 18.2 (H) 4.0 - 10.5 K/uL   RBC 3.31 (L) 3.87 - 5.11 MIL/uL   Hemoglobin 10.4 (L) 12.0 - 15.0 g/dL   HCT 30.8 (L) 36.0 - 46.0 %   MCV 93.1 80.0 - 100.0 fL   MCH 31.4 26.0 - 34.0 pg   MCHC 33.8 30.0 - 36.0 g/dL   RDW 13.4 11.5 - 15.5 %   Platelets 202 150 - 400 K/uL   nRBC 0.0 0.0 - 0.2 %    Comment: Performed at [facility], [place] [address] [place], [place] [address]  Comprehensive metabolic panel     Status: Abnormal   Collection Time: 10/02/21  1:13 AM  Result Value Ref Range   Sodium 138 135 - 145 mmol/L   Potassium 3.8 3.5 - 5.1 mmol/L    Comment: DELTA CHECK NOTED   Chloride 107 98 - 111 mmol/L   CO2 22 22 - 32 mmol/L   Glucose, Bld 125 (H) 70 - 99 mg/dL    Comment: Glucose reference range applies only to samples taken after fasting for at least 8 hours.   BUN 16 8 - 23 mg/dL   Creatinine, Ser 1.28 (H) 0.44 - 1.00 mg/dL   Calcium 8.4 (L) 8.9 - 10.3 mg/dL   Total Protein 5.9 (L) 6.5 - 8.1 g/dL   Albumin 2.3 (L) 3.5 - 5.0 g/dL   AST 23 15 - 41 U/L   ALT 56 (H) 0 - 44 U/L   Alkaline Phosphatase 111 38 - 126 U/L   Total Bilirubin 1.1 0.3 - 1.2 mg/dL   GFR, Estimated 41 (L) >60 mL/min    Comment: (NOTE)  Calculated using the CKD-EPI Creatinine Equation (2021)    Anion gap 9 5 - 15    Comment: Performed at [facility], [place] [address] [place], [place] [address]    Studies/Results: DG ERCP  Result Date: 10/01/2021 CLINICAL DATA:  Abdominal pain. Suspected choledocholithiasis on recent CT. EXAM: ERCP TECHNIQUE: Multiple spot images obtained with the fluoroscopic device and submitted for interpretation post-procedure. COMPARISON:  CT 09/29/2021 and previous FINDINGS: A series of fluoroscopic spot images document endoscopic cannulation and opacification  of the CBD with passage of balloon catheter through the distal CBD. The CBD is dilated. Incomplete opacification of the intrahepatic biliary tree, which appears mildly dilated centrally. Cholecystectomy clips are noted. No extravasation. IMPRESSION: Endoscopic CBD cannulation and intervention as above. These images were submitted for radiologic interpretation only. Please see the procedural report for the amount of contrast and the fluoroscopy time utilized. Electronically Signed   By: Lucrezia Europe M.D.   On: 10/01/2021 11:32    Medications: I have reviewed the patient's current medications.  Assessment: Cholangitis E. coli and Enterobacterales bacteremia(blood culture from 09/29/2021) Status post ERCP with sphincterotomy, balloon sweep and basket extraction of large CBD stones and sludge  WBC improved to 18.2 T. bili improved to 1.1, ALP and AST normal, ALT almost normal at 56 Creatinine 1.28, GFR 41  Plan: Continue regular diet, continue antibiotics, IV Zosyn while inpatient, recommend Augmentin twice a day for 7 days on discharge. GI will sign off. GI follow-up not needed post discharge.  Ronnette Juniper, MD 10/02/2021, 9:28 AM

## 2021-10-03 LAB — COMPREHENSIVE METABOLIC PANEL
ALT: 42 U/L (ref 0–44)
AST: 20 U/L (ref 15–41)
Albumin: 2.3 g/dL — ABNORMAL LOW (ref 3.5–5.0)
Alkaline Phosphatase: 91 U/L (ref 38–126)
Anion gap: 7 (ref 5–15)
BUN: 14 mg/dL (ref 8–23)
CO2: 22 mmol/L (ref 22–32)
Calcium: 8.1 mg/dL — ABNORMAL LOW (ref 8.9–10.3)
Chloride: 109 mmol/L (ref 98–111)
Creatinine, Ser: 1.2 mg/dL — ABNORMAL HIGH (ref 0.44–1.00)
GFR, Estimated: 44 mL/min — ABNORMAL LOW (ref >60)
Glucose, Bld: 102 mg/dL — ABNORMAL HIGH (ref 70–99)
Potassium: 3.2 mmol/L — ABNORMAL LOW (ref 3.5–5.1)
Sodium: 138 mmol/L (ref 135–145)
Total Bilirubin: 0.7 mg/dL (ref 0.3–1.2)
Total Protein: 5.6 g/dL — ABNORMAL LOW (ref 6.5–8.1)

## 2021-10-03 LAB — CULTURE, BLOOD (ROUTINE X 2): Special Requests: ADEQUATE

## 2021-10-03 LAB — CBC
HCT: 31.1 % — ABNORMAL LOW (ref 36.0–46.0)
Hemoglobin: 10.3 g/dL — ABNORMAL LOW (ref 12.0–15.0)
MCH: 31.2 pg (ref 26.0–34.0)
MCHC: 33.1 g/dL (ref 30.0–36.0)
MCV: 94.2 fL (ref 80.0–100.0)
Platelets: 217 10*3/uL (ref 150–400)
RBC: 3.3 MIL/uL — ABNORMAL LOW (ref 3.87–5.11)
RDW: 13.7 % (ref 11.5–15.5)
WBC: 13.3 10*3/uL — ABNORMAL HIGH (ref 4.0–10.5)
nRBC: 0 % (ref 0.0–0.2)

## 2021-10-03 MED ORDER — POTASSIUM CHLORIDE CRYS ER 20 MEQ PO TBCR: 20.0000 meq | EXTENDED_RELEASE_TABLET | Freq: Every day | ORAL | 0 refills | Status: DC

## 2021-10-03 MED ORDER — AMOXICILLIN-POT CLAVULANATE 500-125 MG PO TABS: 1.0000 | ORAL_TABLET | Freq: Two times a day (BID) | ORAL | 0 refills | Status: AC

## 2021-10-03 MED ORDER — POTASSIUM CHLORIDE CRYS ER 20 MEQ PO TBCR
40.0000 meq | EXTENDED_RELEASE_TABLET | Freq: Once | ORAL | Status: AC
  Administered 2021-10-03: 40 meq via ORAL
  Filled 2021-10-03: qty 2

## 2021-10-03 NOTE — Discharge Instructions (Signed)
Follow with Primary MD [name], MD in 10 days   Get CBC, CMP,  checked  by Primary MD next visit.    Activity: As tolerated with Full fall precautions use walker/cane & assistance as needed   Disposition Home    Diet: Heart Healthy    On your next visit with your primary care physician please Get Medicines reviewed and adjusted.   Please request your Prim.MD to go over all Hospital Tests and Procedure/Radiological results at the follow up, please get all Hospital records sent to your Prim MD by signing hospital release before you go home.   If you experience worsening of your admission symptoms, develop shortness of breath, life threatening emergency, suicidal or homicidal thoughts you must seek medical attention immediately by calling 911 or calling your MD immediately  if symptoms less severe.  You Must read complete instructions/literature along with all the possible adverse reactions/side effects for all the Medicines you take and that have been prescribed to you. Take any new Medicines after you have completely understood and accpet all the possible adverse reactions/side effects.   Do not drive, operating heavy machinery, perform activities at heights, swimming or participation in water activities or provide baby sitting services if your were admitted for syncope or siezures until you have seen by Primary MD or a Neurologist and advised to do so again.  Do not drive when taking Pain medications.    Do not take more than prescribed Pain, Sleep and Anxiety Medications  Special Instructions: If you have smoked or chewed Tobacco  in the last 2 yrs please stop smoking, stop any regular Alcohol  and or any Recreational drug use.  Wear Seat belts while driving.   Please note  You were cared for by a hospitalist during your hospital stay. If you have any questions about your discharge medications or the care you received while you were in the hospital after you are  discharged, you can call the unit and asked to speak with the hospitalist on call if the hospitalist that took care of you is not available. Once you are discharged, your primary care physician will handle any further medical issues. Please note that NO REFILLS for any discharge medications will be authorized once you are discharged, as it is imperative that you return to your primary care physician (or establish a relationship with a primary care physician if you do not have one) for your aftercare needs so that they can reassess your need for medications and monitor your lab values.

## 2021-10-03 NOTE — Discharge Summary (Signed)
Physician Discharge Summary  Valerie Cooper VVO:[id number] DOB: Nov 06, 1933 DOA: 09/29/2021  PCP: Valerie Low, MD  Admit date: 09/29/2021 Discharge date: 10/03/2021  Admitted From: Home Disposition:  Home   Recommendations for Outpatient Follow-up:  Follow up with PCP in 1-2 weeks Please obtain CMP/CBC in one week, please monitor potassium level and continue supplement if needed. Please follow on final susceptibilities of her E. coli bacteremia, still pending at time of discharge, treated with IV Zosyn during hospital stay, will be discharged on Augmentin.  Home Health:NO   Discharge Condition:Stable CODE STATUS:FULL Diet recommendation: Heart Healthy   Brief/Interim Summary:  Valerie Cooper is a 88 y.o. 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 -Her work-up significant for elevated LFTs, leukocytosis, with clinical suspicion of cholangitis, patient was transferred from [facility] to [facility], for ERCP by GI. - Status post ERCP with sphincterotomy, balloon sweep and basket extraction of large CBD stones and sludge by Dr. Therisa Cooper on 6/15.    Discharge Diagnoses:  Active Problems:   Symptomatic cholelithiasis   PUD (peptic ulcer disease) in distant past   HTN (hypertension)   HLD (hyperlipidemia)   Malignant neoplasm of upper-inner quadrant of right breast in female, estrogen receptor positive ([place])   Acute cholangitis  Choledocholithiasis Cholangitis E coli bacteremia Transaminitis Jaundice/hyperbilirubinemia -Status post cholecystostomy. -presents with hyperbilirubinemia, transaminitis, imaging suspicious for dilated common bile duct. -Blood culture growing E. coli, follow on susceptibilities, meanwhile continue with IV Zosyn, appears to be improving, nontoxic-appearing. -GI input  greatly appreciated, status post ERCP with common bile duct stone noted and proximal third dilated, status post Complete removal was accomplished by biliary sphincterotomy and balloon and basket extraction,   biliary sphincterotomy was performed.The biliary tree was swept. -Leukocytosis trending down, it is 13 today, he was treated with IV Zosyn during entire hospital stay, and will be discharged on another 7 days of Augmentin -Total bili improving, LFTs almost normalized as well.   Hypokalemia -Repleted, will be discharged on supplement, monitor level as an outpatient.History of peptic ulcer disease: Resume PPI   AKI on CKD stage IIIa: Patient did have elevated creatinine 1.58 on presentation from baseline 1.1.  Improving with hydration to 1.2 today.   Hyperlipidemia:LFTs  has normalized, resume statin    Breast cancer: S/p surgery and radiation.  Follows oncology in community and was recently started on tamoxifen.  Patient wants to discuss with her primary oncologist before resuming this medication.   Hypertension: Resume home meds    Discharge Instructions  Discharge Instructions     Diet - Cooper sodium heart healthy   Complete by: As directed    Discharge instructions   Complete by: As directed    Follow with Primary MD Valerie Low, MD in 10 days   Get CBC, CMP,  checked  by Primary MD next visit.    Activity: As tolerated with Full fall precautions use walker/cane & assistance as needed   Disposition Home    Diet: Heart Healthy    On your next visit with your primary care physician please Get Medicines reviewed and adjusted.   Please request your Prim.MD to go over all Hospital Tests and Procedure/Radiological results at the follow up, please get all Hospital records sent to your Prim MD by signing hospital release before you go home.   If you experience worsening  of your admission symptoms, develop shortness of breath, life threatening emergency, suicidal or  homicidal thoughts you must seek medical attention immediately by calling 911 or calling your MD immediately  if symptoms less severe.  You Must read complete instructions/literature along with all the possible adverse reactions/side effects for all the Medicines you take and that have been prescribed to you. Take any new Medicines after you have completely understood and accpet all the possible adverse reactions/side effects.   Do not drive, operating heavy machinery, perform activities at heights, swimming or participation in water activities or provide baby sitting services if your were admitted for syncope or siezures until you have seen by Primary MD or a Neurologist and advised to do so again.  Do not drive when taking Pain medications.    Do not take more than prescribed Pain, Sleep and Anxiety Medications  Special Instructions: If you have smoked or chewed Tobacco  in the last 2 yrs please stop smoking, stop any regular Alcohol  and or any Recreational drug use.  Wear Seat belts while driving.   Please note  You were cared for by a hospitalist during your hospital stay. If you have any questions about your discharge medications or the care you received while you were in the hospital after you are discharged, you can call the unit and asked to speak with the hospitalist on call if the hospitalist that took care of you is not available. Once you are discharged, your primary care physician will handle any further medical issues. Please note that NO REFILLS for any discharge medications will be authorized once you are discharged, as it is imperative that you return to your primary care physician (or establish a relationship with a primary care physician if you do not have one) for your aftercare needs so that they can reassess your need for medications and monitor your lab values.   Increase activity slowly   Complete by: As directed       Allergies as of 10/03/2021       Reactions    Antivert [meclizine Hcl]    Other reaction(s): hallucinations   Other Other (See Comments)   Anesthesia medications cause "deep sleep"        Medication List     STOP taking these medications    acetaminophen 325 MG tablet Commonly known as: TYLENOL   oxyCODONE 5 MG immediate release tablet Commonly known as: Oxy IR/ROXICODONE       TAKE these medications    amLODipine 5 MG tablet Commonly known as: NORVASC Take by mouth 2 (two) times daily.   amoxicillin-clavulanate 500-125 MG tablet Commonly known as: Augmentin Take 1 tablet (500 mg total) by mouth 2 (two) times daily for 7 days.   atorvastatin 20 MG tablet Commonly known as: LIPITOR Take 20 mg by mouth daily.   cholecalciferol 25 MCG (1000 UNIT) tablet Commonly known as: VITAMIN D3 Take 1,000 Units by mouth daily.   ketorolac 0.5 % ophthalmic solution Commonly known as: ACULAR Place 1 drop into the right eye 3 (three) times daily.   pantoprazole 40 MG tablet Commonly known as: PROTONIX Take 40 mg by mouth daily.   potassium chloride SA 20 MEQ tablet Commonly known as: KLOR-CON M Take 1 tablet (20 mEq total) by mouth daily for 15 days.   prednisoLONE acetate 1 % ophthalmic suspension Commonly known as: PRED FORTE Place 1 drop into the right eye 3 (three) times daily.   tamoxifen 20 MG tablet Commonly known  as: NOLVADEX Take 1 tablet (20 mg total) by mouth daily.   valsartan 80 MG tablet Commonly known as: DIOVAN Take 80 mg by mouth daily.        Allergies  Allergen Reactions   Antivert [Meclizine Hcl]     Other reaction(s): hallucinations   Other Other (See Comments)    Anesthesia medications cause "deep sleep"    Consultations: GI   Procedures/Studies: DG ERCP  Result Date: 10/01/2021 CLINICAL DATA:  Abdominal pain. Suspected choledocholithiasis on recent CT. EXAM: ERCP TECHNIQUE: Multiple spot images obtained with the fluoroscopic device and submitted for interpretation  post-procedure. COMPARISON:  CT 09/29/2021 and previous FINDINGS: A series of fluoroscopic spot images document endoscopic cannulation and opacification of the CBD with passage of balloon catheter through the distal CBD. The CBD is dilated. Incomplete opacification of the intrahepatic biliary tree, which appears mildly dilated centrally. Cholecystectomy clips are noted. No extravasation. IMPRESSION: Endoscopic CBD cannulation and intervention as above. These images were submitted for radiologic interpretation only. Please see the procedural report for the amount of contrast and the fluoroscopy time utilized. Electronically Signed   By: Lucrezia Europe M.D.   On: 10/01/2021 11:32   CT Abdomen Pelvis W Contrast  Result Date: 09/29/2021 CLINICAL DATA:  Abdominal pain EXAM: CT ABDOMEN AND PELVIS WITH CONTRAST TECHNIQUE: Multidetector CT imaging of the abdomen and pelvis was performed using the standard protocol following bolus administration of intravenous contrast. RADIATION DOSE REDUCTION: This exam was performed according to the departmental dose-optimization program which includes automated exposure control, adjustment of the mA and/or kV according to patient size and/or use of iterative reconstruction technique. CONTRAST:  21m OMNIPAQUE IOHEXOL 300 MG/ML  SOLN COMPARISON:  CT abdomen and pelvis dated December 21, 2018 FINDINGS: Lower chest: No acute abnormality. Hepatobiliary: No suspicious liver lesions. Gallbladder is surgically absent. Dilated intrahepatic bile ducts and severe dilation of the common bile duct, measuring up to 1.7 cm. Filling defect of the distal common bile duct measuring up to 1.4 cm and mild wall thickening. Pancreas: Unremarkable. No pancreatic ductal dilatation or surrounding inflammatory changes. Spleen: Normal in size without focal abnormality. Adrenals/Urinary Tract: Bilateral adrenal glands are unremarkable. No hydronephrosis or nephrolithiasis. Bladder is unremarkable. Stomach/Bowel:  Small hiatal hernia. Wall thickening of the antrum of the stomach. Diverticulosis. No evidence of obstruction. No bowel wall thickening. Vascular/Lymphatic: Aortic atherosclerosis. No enlarged abdominal or pelvic lymph nodes. Reproductive: No adnexal masses. Other: Small fat containing umbilical hernia. Surgical clips noted in the right breast. No abdominopelvic ascites. Musculoskeletal: Moderate wedge compression deformity of the T12 vertebral body, unchanged when compared with prior exam. No aggressive appearing osseous lesions. IMPRESSION: 1. Severe biliary ductal dilation with 1.4 cm filling defect of the distal common bile duct, findings are compatible with choledocholithiasis. Wall thickening of the common bile duct is concerning for superimposed cholangitis. 2. Wall thickening of the antrum of the stomach, findings can be seen in the setting of gastritis. Endoscopy could be performed for further evaluation. 3.  Aortic Atherosclerosis (ICD10-I70.0). Electronically Signed   By: LYetta GlassmanM.D.   On: 09/29/2021 17:50   DG Chest Portable 1 View  Result Date: 09/29/2021 CLINICAL DATA:  Leukocytosis EXAM: PORTABLE CHEST 1 VIEW COMPARISON:  11/18/2020 FINDINGS: Midline trachea. Mild cardiomegaly. Mediastinal contours otherwise within normal limits. No pleural effusion or pneumothorax. Clear lungs. Degenerative changes of the left greater than right shoulders. IMPRESSION: Mild cardiomegaly, without acute disease. Electronically Signed   By: KAbigail MiyamotoM.D.   On: 09/29/2021 17:45  US Abdomen Limited RUQ (LIVER/GB)  Result Date: 09/29/2021 CLINICAL DATA:  Abdominal pain EXAM: ULTRASOUND ABDOMEN LIMITED RIGHT UPPER QUADRANT COMPARISON:  Same day CT of the abdomen and pelvis dated September 29, 2020 FINDINGS: Gallbladder: Surgically absent. Common bile duct: Diameter: 17.3 Intrahepatic biliary ductal dilation. Liver: No focal lesion identified. Within normal limits in parenchymal echogenicity. Portal vein is  patent on color Doppler imaging with normal direction of blood flow towards the liver. Other: None. IMPRESSION: Markedly dilated common bile duct and dilated intrahepatic bile ducts. See same day CT of the abdomen and pelvis for further evaluation. Electronically Signed   By: Yetta Glassman M.D.   On: 09/29/2021 17:39    ERCP by Dr. Therisa Cooper   Subjective: No nausea, no vomiting, no abdominal pain, good appetite  Discharge Exam: Vitals:   10/02/21 2359 10/03/21 0356  BP: (!) 120/54 133/60  Pulse: (!) 59 67  Resp: 18 18  Temp: 98 F (36.7 C) 98 F (36.7 C)  SpO2:     Vitals:   10/02/21 0337 10/02/21 2156 10/02/21 2359 10/03/21 0356  BP: 131/69 131/70 (!) 120/54 133/60  Pulse: 69  (!) 59 67  Resp: '15  18 18  '$ Temp: 98.1 F (36.7 C)  98 F (36.7 C) 98 F (36.7 C)  TempSrc: Oral  Oral Oral  SpO2: 98%     Weight:      Height:        General: Pt is alert, awake, not in acute distress Cardiovascular: RRR, S1/S2 +, no rubs, no gallops Respiratory: CTA bilaterally, no wheezing, no rhonchi Abdominal: Soft, NT, ND, bowel sounds + Extremities: no edema, no cyanosis    The results of significant diagnostics from this hospitalization (including imaging, microbiology, ancillary and laboratory) are listed below for reference.     Microbiology: Recent Results (from the past 240 hour(s))  Culture, blood (routine x 2)     Status: Abnormal (Preliminary result)   Collection Time: 09/29/21  6:12 PM   Specimen: BLOOD  Result Value Ref Range Status   Specimen Description   Final    BLOOD LEFT ANTECUBITAL Performed at [facility], [address], [place], [place] [address]    Special Requests   Final    BOTTLES DRAWN AEROBIC AND ANAEROBIC Blood Culture adequate volume Performed at [facility], [address] [place], [place] [address]    Culture  Setup Time   Final    GRAM NEGATIVE RODS AEROBIC BOTTLE ONLY CRITICAL RESULT CALLED TO, READ  BACK BY AND VERIFIED WITH: PHARMD JAMES LEDFORD 10/01/21'@1'$ :54 BY TW    Culture (A)  Final    ESCHERICHIA COLI SUSCEPTIBILITIES TO FOLLOW Performed at [facility] [address], [place], [place] [address]    Report Status PENDING  Incomplete  Blood Culture ID Panel (Reflexed)     Status: Abnormal   Collection Time: 09/29/21  6:12 PM  Result Value Ref Range Status   Enterococcus faecalis NOT DETECTED NOT DETECTED Final   Enterococcus Faecium NOT DETECTED NOT DETECTED Final   Listeria monocytogenes NOT DETECTED NOT DETECTED Final   Staphylococcus species NOT DETECTED NOT DETECTED Final   Staphylococcus aureus (BCID) NOT DETECTED NOT DETECTED Final   Staphylococcus epidermidis NOT DETECTED NOT DETECTED Final   Staphylococcus lugdunensis NOT DETECTED NOT DETECTED Final   Streptococcus species NOT DETECTED NOT DETECTED Final   Streptococcus agalactiae NOT DETECTED NOT DETECTED Final   Streptococcus pneumoniae NOT DETECTED NOT DETECTED Final   Streptococcus pyogenes  NOT DETECTED NOT DETECTED Final   A.calcoaceticus-baumannii NOT DETECTED NOT DETECTED Final   Bacteroides fragilis NOT DETECTED NOT DETECTED Final   Enterobacterales DETECTED (A) NOT DETECTED Final    Comment: Enterobacterales represent a large order of gram negative bacteria, not a single organism. CRITICAL RESULT CALLED TO, READ BACK BY AND VERIFIED WITH: PHARMD JAMES LEDFORD 10/01/21'@1'$ :54 BY TW    Enterobacter cloacae complex NOT DETECTED NOT DETECTED Final   Escherichia coli DETECTED (A) NOT DETECTED Final    Comment: CRITICAL RESULT CALLED TO, READ BACK BY AND VERIFIED WITH: PHARMD JAMES LEDFORD 10/01/21'@1'$ :54 BY TW    Klebsiella aerogenes NOT DETECTED NOT DETECTED Final   Klebsiella oxytoca NOT DETECTED NOT DETECTED Final   Klebsiella pneumoniae NOT DETECTED NOT DETECTED Final   Proteus species NOT DETECTED NOT DETECTED Final   Salmonella species NOT DETECTED NOT DETECTED Final   Serratia marcescens NOT  DETECTED NOT DETECTED Final   Haemophilus influenzae NOT DETECTED NOT DETECTED Final   Neisseria meningitidis NOT DETECTED NOT DETECTED Final   Pseudomonas aeruginosa NOT DETECTED NOT DETECTED Final   Stenotrophomonas maltophilia NOT DETECTED NOT DETECTED Final   Candida albicans NOT DETECTED NOT DETECTED Final   Candida auris NOT DETECTED NOT DETECTED Final   Candida glabrata NOT DETECTED NOT DETECTED Final   Candida krusei NOT DETECTED NOT DETECTED Final   Candida parapsilosis NOT DETECTED NOT DETECTED Final   Candida tropicalis NOT DETECTED NOT DETECTED Final   Cryptococcus neoformans/gattii NOT DETECTED NOT DETECTED Final   CTX-M ESBL NOT DETECTED NOT DETECTED Final   Carbapenem resistance IMP NOT DETECTED NOT DETECTED Final   Carbapenem resistance KPC NOT DETECTED NOT DETECTED Final   Carbapenem resistance NDM NOT DETECTED NOT DETECTED Final   Carbapenem resist OXA 48 LIKE NOT DETECTED NOT DETECTED Final   Carbapenem resistance VIM NOT DETECTED NOT DETECTED Final    Comment: Performed at [facility], [address], [place], [place] [address]  Culture, blood (routine x 2)     Status: None (Preliminary result)   Collection Time: 09/29/21  6:17 PM   Specimen: BLOOD  Result Value Ref Range Status   Specimen Description   Final    BLOOD BLOOD RIGHT FOREARM Performed at [facility], [address], [place], [place] [address]    Special Requests   Final    Blood Culture adequate volume BOTTLES DRAWN AEROBIC AND ANAEROBIC Performed at [facility], [address] [place], [place] [address]    Culture   Final    NO GROWTH 3 DAYS Performed at [facility], [place] [address] [place], [place] [address]    Report Status PENDING  Incomplete     Labs: BNP (last 3 results) No results for input(s): "BNP" in the last 8760 hours. Basic Metabolic Panel: Recent Labs  Lab 09/29/21 1550 09/30/21 1319 10/01/21 0054 10/02/21 0113  10/03/21 0100  NA 132* 136 139 138 138  K 4.3 3.2* 2.9* 3.8 3.2*  CL 94* 102 105 107 109  CO2 '25 22 25 22 22  '$ GLUCOSE 85 74 85 125* 102*  BUN 34* 26* '20 16 14  '$ CREATININE 1.56* 1.31* 1.17* 1.28* 1.20*  CALCIUM 9.8 8.8* 8.4* 8.4* 8.1*   Liver Function Tests: Recent Labs  Lab 09/29/21 1550 09/30/21 1319 10/01/21 0054 10/02/21 0113 10/03/21 0100  AST 124* 49* '31 23 20  '$ ALT 170* 103* 76* 56* 42  ALKPHOS 164* 139* 129* 111 91  BILITOT 5.3* 2.2* 1.6* 1.1  0.7  PROT 8.3* 6.8 6.2* 5.9* 5.6*  ALBUMIN 4.0 2.9* 2.5* 2.3* 2.3*   Recent Labs  Lab 09/29/21 1550  LIPASE <10*   No results for input(s): "AMMONIA" in the last 168 hours. CBC: Recent Labs  Lab 09/29/21 1550 09/30/21 1319 10/01/21 0054 10/02/21 0113 10/03/21 0100  WBC 32.1* 29.5* 24.1* 18.2* 13.3*  NEUTROABS 29.3* 27.7*  --   --   --   HGB 13.0 12.4 10.7* 10.4* 10.3*  HCT 38.7 35.9* 30.4* 30.8* 31.1*  MCV 93.9 93.0 92.1 93.1 94.2  PLT 116* 191 182 202 217   Cardiac Enzymes: No results for input(s): "CKTOTAL", "CKMB", "CKMBINDEX", "TROPONINI" in the last 168 hours. BNP: Invalid input(s): "POCBNP" CBG: No results for input(s): "GLUCAP" in the last 168 hours. D-Dimer No results for input(s): "DDIMER" in the last 72 hours. Hgb A1c No results for input(s): "HGBA1C" in the last 72 hours. Lipid Profile No results for input(s): "CHOL", "HDL", "LDLCALC", "TRIG", "CHOLHDL", "LDLDIRECT" in the last 72 hours. Thyroid function studies No results for input(s): "TSH", "T4TOTAL", "T3FREE", "THYROIDAB" in the last 72 hours.  Invalid input(s): "FREET3" Anemia work up No results for input(s): "VITAMINB12", "FOLATE", "FERRITIN", "TIBC", "IRON", "RETICCTPCT" in the last 72 hours. Urinalysis    Component Value Date/Time   COLORURINE YELLOW 09/29/2021 1550   APPEARANCEUR HAZY (A) 09/29/2021 1550   LABSPEC 1.024 09/29/2021 1550   PHURINE 5.5 09/29/2021 1550   GLUCOSEU NEGATIVE 09/29/2021 1550   HGBUR SMALL (A) 09/29/2021  1550   BILIRUBINUR MODERATE (A) 09/29/2021 1550   KETONESUR NEGATIVE 09/29/2021 1550   PROTEINUR 30 (A) 09/29/2021 1550   UROBILINOGEN 0.2 02/13/2015 0045   NITRITE NEGATIVE 09/29/2021 1550   LEUKOCYTESUR MODERATE (A) 09/29/2021 1550   Sepsis Labs Recent Labs  Lab 09/30/21 1319 10/01/21 0054 10/02/21 0113 10/03/21 0100  WBC 29.5* 24.1* 18.2* 13.3*   Microbiology Recent Results (from the past 240 hour(s))  Culture, blood (routine x 2)     Status: Abnormal (Preliminary result)   Collection Time: 09/29/21  6:12 PM   Specimen: BLOOD  Result Value Ref Range Status   Specimen Description   Final    BLOOD LEFT ANTECUBITAL Performed at [facility], [address] [place], [place] [address]    Special Requests   Final    BOTTLES DRAWN AEROBIC AND ANAEROBIC Blood Culture adequate volume Performed at [facility], [address] [place], [place] [address]    Culture  Setup Time   Final    GRAM NEGATIVE RODS AEROBIC BOTTLE ONLY CRITICAL RESULT CALLED TO, READ BACK BY AND VERIFIED WITH: PHARMD JAMES LEDFORD 10/01/21'@1'$ :54 BY TW    Culture (A)  Final    ESCHERICHIA COLI SUSCEPTIBILITIES TO FOLLOW Performed at [facility], [place] [address] [place] [address], [place], [place] [address]    Report Status PENDING  Incomplete  Blood Culture ID Panel (Reflexed)     Status: Abnormal   Collection Time: 09/29/21  6:12 PM  Result Value Ref Range Status   Enterococcus faecalis NOT DETECTED NOT DETECTED Final   Enterococcus Faecium NOT DETECTED NOT DETECTED Final   Listeria monocytogenes NOT DETECTED NOT DETECTED Final   Staphylococcus species NOT DETECTED NOT DETECTED Final   Staphylococcus aureus (BCID) NOT DETECTED NOT DETECTED Final   Staphylococcus epidermidis NOT DETECTED NOT DETECTED Final   Staphylococcus lugdunensis NOT DETECTED NOT DETECTED Final   Streptococcus species NOT DETECTED NOT DETECTED Final   Streptococcus agalactiae NOT DETECTED NOT  DETECTED Final   Streptococcus  pneumoniae NOT DETECTED NOT DETECTED Final   Streptococcus pyogenes NOT DETECTED NOT DETECTED Final   A.calcoaceticus-baumannii NOT DETECTED NOT DETECTED Final   Bacteroides fragilis NOT DETECTED NOT DETECTED Final   Enterobacterales DETECTED (A) NOT DETECTED Final    Comment: Enterobacterales represent a large order of gram negative bacteria, not a single organism. CRITICAL RESULT CALLED TO, READ BACK BY AND VERIFIED WITH: PHARMD JAMES LEDFORD 10/01/21'@1'$ :54 BY TW    Enterobacter cloacae complex NOT DETECTED NOT DETECTED Final   Escherichia coli DETECTED (A) NOT DETECTED Final    Comment: CRITICAL RESULT CALLED TO, READ BACK BY AND VERIFIED WITH: PHARMD JAMES LEDFORD 10/01/21'@1'$ :54 BY TW    Klebsiella aerogenes NOT DETECTED NOT DETECTED Final   Klebsiella oxytoca NOT DETECTED NOT DETECTED Final   Klebsiella pneumoniae NOT DETECTED NOT DETECTED Final   Proteus species NOT DETECTED NOT DETECTED Final   Salmonella species NOT DETECTED NOT DETECTED Final   Serratia marcescens NOT DETECTED NOT DETECTED Final   Haemophilus influenzae NOT DETECTED NOT DETECTED Final   Neisseria meningitidis NOT DETECTED NOT DETECTED Final   Pseudomonas aeruginosa NOT DETECTED NOT DETECTED Final   Stenotrophomonas maltophilia NOT DETECTED NOT DETECTED Final   Candida albicans NOT DETECTED NOT DETECTED Final   Candida auris NOT DETECTED NOT DETECTED Final   Candida glabrata NOT DETECTED NOT DETECTED Final   Candida krusei NOT DETECTED NOT DETECTED Final   Candida parapsilosis NOT DETECTED NOT DETECTED Final   Candida tropicalis NOT DETECTED NOT DETECTED Final   Cryptococcus neoformans/gattii NOT DETECTED NOT DETECTED Final   CTX-M ESBL NOT DETECTED NOT DETECTED Final   Carbapenem resistance IMP NOT DETECTED NOT DETECTED Final   Carbapenem resistance KPC NOT DETECTED NOT DETECTED Final   Carbapenem resistance NDM NOT DETECTED NOT DETECTED Final   Carbapenem resist OXA 48 LIKE NOT  DETECTED NOT DETECTED Final   Carbapenem resistance VIM NOT DETECTED NOT DETECTED Final    Comment: Performed at [facility], [address] [place], [place] [address]  Culture, blood (routine x 2)     Status: None (Preliminary result)   Collection Time: 09/29/21  6:17 PM   Specimen: BLOOD  Result Value Ref Range Status   Specimen Description   Final    BLOOD BLOOD RIGHT FOREARM Performed at [facility], [address] [place], [place] [address]    Special Requests   Final    Blood Culture adequate volume BOTTLES DRAWN AEROBIC AND ANAEROBIC Performed at [facility], [address], [place], [place] [address]    Culture   Final    NO GROWTH 3 DAYS Performed at [facility], [place] [address] [place], [place] [address]    Report Status PENDING  Incomplete     Time coordinating discharge: Over 30 minutes  SIGNED:   Phillips Climes, MD  [facility] 10/03/2021, 7:10 AM Pager   If 7PM-7AM, please contact night-coverage [email or web address] Password [email or web address]

## 2021-10-03 NOTE — Plan of Care (Signed)
  Problem: Clinical Measurements: Goal: Ability to maintain clinical measurements within normal limits will improve Outcome: Progressing   Problem: Clinical Measurements: Goal: Will remain free from infection Outcome: Progressing   Problem: Clinical Measurements: Goal: Diagnostic test results will improve Outcome: Progressing   

## 2021-10-04 LAB — CULTURE, BLOOD (ROUTINE X 2)
Culture: NO GROWTH
Special Requests: ADEQUATE

## 2021-10-05 ENCOUNTER — Encounter (HOSPITAL_COMMUNITY): Payer: Self-pay | Admitting: Gastroenterology

## 2021-10-06 ENCOUNTER — Telehealth: Payer: Self-pay

## 2021-10-06 NOTE — Telephone Encounter (Signed)
Attempted to call pt to make her aware of provider change from Dr Chryl Heck to Wilber Bihari, NP 12/28/21 at 1130. LVM for return call to explain reason.

## 2021-10-13 DIAGNOSIS — R7881 Bacteremia: Secondary | ICD-10-CM | POA: Diagnosis not present

## 2021-10-13 DIAGNOSIS — K8309 Other cholangitis: Secondary | ICD-10-CM | POA: Diagnosis not present

## 2021-10-13 DIAGNOSIS — N390 Urinary tract infection, site not specified: Secondary | ICD-10-CM | POA: Diagnosis not present

## 2021-10-13 DIAGNOSIS — K8050 Calculus of bile duct without cholangitis or cholecystitis without obstruction: Secondary | ICD-10-CM | POA: Diagnosis not present

## 2021-11-03 DIAGNOSIS — K8309 Other cholangitis: Secondary | ICD-10-CM | POA: Diagnosis not present

## 2021-11-03 DIAGNOSIS — D649 Anemia, unspecified: Secondary | ICD-10-CM | POA: Diagnosis not present

## 2021-11-03 DIAGNOSIS — I10 Essential (primary) hypertension: Secondary | ICD-10-CM | POA: Diagnosis not present

## 2021-11-06 DIAGNOSIS — H35351 Cystoid macular degeneration, right eye: Secondary | ICD-10-CM | POA: Diagnosis not present

## 2021-11-06 DIAGNOSIS — H43813 Vitreous degeneration, bilateral: Secondary | ICD-10-CM | POA: Diagnosis not present

## 2021-11-17 ENCOUNTER — Ambulatory Visit: Payer: Medicare Other | Admitting: Hematology and Oncology

## 2021-11-17 ENCOUNTER — Encounter: Payer: Medicare Other | Admitting: *Deleted

## 2021-12-25 ENCOUNTER — Encounter: Payer: Self-pay | Admitting: *Deleted

## 2021-12-28 ENCOUNTER — Encounter: Payer: Medicare Other | Admitting: *Deleted

## 2021-12-28 ENCOUNTER — Other Ambulatory Visit: Payer: Self-pay

## 2021-12-28 ENCOUNTER — Encounter: Payer: Self-pay | Admitting: Adult Health

## 2021-12-28 ENCOUNTER — Inpatient Hospital Stay: Payer: Medicare Other | Attending: Hematology and Oncology | Admitting: Adult Health

## 2021-12-28 VITALS — BP 139/72 | HR 80 | Temp 97.9°F | Resp 16 | Ht 61.0 in | Wt 145.1 lb

## 2021-12-28 DIAGNOSIS — Z923 Personal history of irradiation: Secondary | ICD-10-CM | POA: Diagnosis not present

## 2021-12-28 DIAGNOSIS — Z79899 Other long term (current) drug therapy: Secondary | ICD-10-CM | POA: Insufficient documentation

## 2021-12-28 DIAGNOSIS — Z170 Estrogen receptor positive status [ER+]: Secondary | ICD-10-CM | POA: Diagnosis not present

## 2021-12-28 DIAGNOSIS — D0511 Intraductal carcinoma in situ of right breast: Secondary | ICD-10-CM | POA: Insufficient documentation

## 2021-12-28 DIAGNOSIS — Z1283 Encounter for screening for malignant neoplasm of skin: Secondary | ICD-10-CM

## 2021-12-28 DIAGNOSIS — C50212 Malignant neoplasm of upper-inner quadrant of left female breast: Secondary | ICD-10-CM | POA: Diagnosis not present

## 2021-12-28 NOTE — Progress Notes (Signed)
SURVIVORSHIP VISIT:  BRIEF ONCOLOGIC HISTORY:  Oncology History  Malignant neoplasm of upper-inner quadrant of right breast in female, estrogen receptor positive ([place])  04/29/2021 Cancer Staging   Staging form: Breast, AJCC 8th Edition - Clinical stage from 04/29/2021: Stage 0 (cTis (DCIS), cN0, cM0, ER+, PR+) - Signed by Gardenia Phlegm, NP on 05/06/2021 Stage prefix: Initial diagnosis   05/06/2021 Initial Diagnosis   Malignant neoplasm of upper-inner quadrant of right breast in female, estrogen receptor positive ([place])   07/20/2021 - 08/07/2021 Radiation Therapy   Site Technique Total Dose (Gy) Dose per Fx (Gy) Completed Fx Beam Energies  Breast, Right: Breast_R 3D 40.05/40.05 2.67 15/15 6XFFF     09/2021 -  Anti-estrogen oral therapy   Tamoxifen     INTERVAL HISTORY:  Valerie Cooper to review her survivorship care plan detailing her treatment course for breast cancer, as well as monitoring long-term side effects of that treatment, education regarding health maintenance, screening, and overall wellness and health promotion.     Overall, Valerie Cooper reports feeling quite well.  She was taking tamoxifen for approximately 3 days and then she remembered that she was instructed at the beginning of her treatment that she would either do antiestrogen therapy or radiation therapy.  Since she completed the radiation therapy she stopped taking tamoxifen and waiting until this appointment for clarification on what she should do.  Since her last visit she did undergo an ERCP where 3 gallstones that have been left behind from her cholecystectomy were removed.  She has lost weight prior to this procedure being completed and is working on gaining some back.  Otherwise she is doing moderately well other than having intermittent nausea.  REVIEW OF SYSTEMS:  Review of Systems  Constitutional:  Negative for appetite change, chills, fatigue, fever and unexpected weight change.  HENT:   Negative for hearing  loss, lump/mass and trouble swallowing.   Eyes:  Negative for eye problems and icterus.  Respiratory:  Negative for chest tightness, cough and shortness of breath.   Cardiovascular:  Negative for chest pain, leg swelling and palpitations.  Gastrointestinal:  Negative for abdominal distention, abdominal pain, constipation, diarrhea, nausea and vomiting.  Endocrine: Negative for hot flashes.  Genitourinary:  Negative for difficulty urinating.   Musculoskeletal:  Negative for arthralgias.  Skin:  Negative for itching and rash.  Neurological:  Negative for dizziness, extremity weakness, headaches and numbness.  Hematological:  Negative for adenopathy. Does not bruise/bleed easily.  Psychiatric/Behavioral:  Negative for depression. The patient is not nervous/anxious.   Breast: Denies any new nodularity, masses, tenderness, nipple changes, or nipple discharge.    ONCOLOGY TREATMENT TEAM:  1. Surgeon:  Dr. Barry Dienes at [facility] 2. Medical Oncologist: Dr. Chryl Heck  3. Radiation Oncologist: Dr. Isidore Moos    PAST MEDICAL/SURGICAL HISTORY:  Past Medical History:  Diagnosis Date   Cancer [facility]) 04/2021   right breast DCIS   Cataract    Complication of anesthesia, + pseudocholinesterase deficiency    It takes awhile for me to wake up "   DJD (degenerative joint disease)    Family history of anesthesia complication    GERD (gastroesophageal reflux disease)    H/O colonoscopy 04/20/2003   Hyperlipidemia    Hypertension    Osteoporosis    Shingles 04/20/1999   right leg   Ulcer 1980s   PUD ?due to ASA intake   Past Surgical History:  Procedure Laterality Date   ABDOMINAL HYSTERECTOMY  1975   BREAST LUMPECTOMY WITH RADIOACTIVE  SEED LOCALIZATION Right 06/17/2021   Procedure: RIGHT BREAST SEED LOCALIZED LUMPECTOMY;  Surgeon: Stark Klein, MD;  Location: [place];  Service: General;  Laterality: Right;   CHOLECYSTECTOMY  05/29/2012   Dr Lucia Gaskins   CHOLECYSTECTOMY N/A  05/29/2012   Procedure: LAPAROSCOPIC CHOLECYSTECTOMY WITH INTRAOPERATIVE CHOLANGIOGRAM;  Surgeon: Shann Medal, MD;  Location: [place];  Service: General;  Laterality: N/A;   ENDOSCOPIC RETROGRADE CHOLANGIOPANCREATOGRAPHY (ERCP) WITH PROPOFOL N/A 10/01/2021   Procedure: ENDOSCOPIC RETROGRADE CHOLANGIOPANCREATOGRAPHY (ERCP) WITH PROPOFOL;  Surgeon: Ronnette Juniper, MD;  Location: [place];  Service: Gastroenterology;  Laterality: N/A;   FRACTURE SURGERY     L arm   REMOVAL OF STONES  10/01/2021   Procedure: REMOVAL OF STONES;  Surgeon: Ronnette Juniper, MD;  Location: [facility] ENDOSCOPY;  Service: Gastroenterology;;   ROTATOR CUFF REPAIR Bilateral 2005   4 surgeries   SPHINCTEROTOMY  10/01/2021   Procedure: SPHINCTEROTOMY;  Surgeon: Ronnette Juniper, MD;  Location: [place];  Service: Gastroenterology;;   STONE EXTRACTION WITH BASKET  10/01/2021   Procedure: STONE EXTRACTION WITH BASKET;  Surgeon: Ronnette Juniper, MD;  Location: [place];  Service: Gastroenterology;;     ALLERGIES:  Allergies  Allergen Reactions   Antivert [Meclizine Hcl]     Other reaction(s): hallucinations   Other Other (See Comments)    Anesthesia medications cause "deep sleep"     CURRENT MEDICATIONS:  Outpatient Encounter Medications as of 12/28/2021  Medication Sig   amLODipine (NORVASC) 5 MG tablet Take by mouth 2 (two) times daily.   atorvastatin (LIPITOR) 20 MG tablet Take 20 mg by mouth daily.   cholecalciferol (VITAMIN D3) 25 MCG (1000 UNIT) tablet Take 1,000 Units by mouth daily.   ketorolac (ACULAR) 0.5 % ophthalmic solution Place 1 drop into the right eye 3 (three) times daily.   pantoprazole (PROTONIX) 40 MG tablet Take 40 mg by mouth daily.   prednisoLONE acetate (PRED FORTE) 1 % ophthalmic suspension Place 1 drop into the right eye 3 (three) times daily.   valsartan (DIOVAN) 80 MG tablet Take 80 mg by mouth daily.   tamoxifen (NOLVADEX) 20 MG tablet Take 1 tablet (20 mg total) by mouth daily. (Patient not taking:  Reported on 12/28/2021)   [DISCONTINUED] potassium chloride SA (KLOR-CON M) 20 MEQ tablet Take 1 tablet (20 mEq total) by mouth daily for 15 days.   No facility-administered encounter medications on file as of 12/28/2021.     ONCOLOGIC FAMILY HISTORY:  Family History  Problem Relation Age of Onset   Stroke Mother    Stroke Father       SOCIAL HISTORY:  Social History   Socioeconomic History   Marital status: Widowed    Spouse name: Not on file   Number of children: Not on file   Years of education: Not on file   Highest education level: Not on file  Occupational History   Not on file  Tobacco Use   Smoking status: Never   Smokeless tobacco: Never  Vaping Use   Vaping Use: Never used  Substance and Sexual Activity   Alcohol use: No   Drug use: No   Sexual activity: Not Currently    Birth control/protection: Surgical  Other Topics Concern   Not on file  Social History Narrative   Not on file   Social Determinants of Health   Financial Resource Strain: Not on file  Food Insecurity: Not on file  Transportation Needs: Not on file  Physical Activity: Not on file  Stress:  Not on file  Social Connections: Not on file  Intimate Partner Violence: Not on file     OBSERVATIONS/OBJECTIVE:  BP 139/72 (BP Location: Left Arm, Patient Position: Sitting)   Pulse 80   Temp 97.9 F (36.6 C) (Temporal)   Resp 16   Ht '5\' 1"'$  (1.549 m)   Wt 145 lb 1.6 oz (65.8 kg)   SpO2 98%   BMI 27.42 kg/m  GENERAL: Patient is a well appearing female in no acute distress HEENT:  Sclerae anicteric.  Oropharynx clear and moist. No ulcerations or evidence of oropharyngeal candidiasis. Neck is supple.  NODES:  No cervical, supraclavicular, or axillary lymphadenopathy palpated.  BREAST EXAM: Right breast status postlumpectomy and radiation no sign of local recurrence left breast is benign LUNGS:  Clear to auscultation bilaterally.  No wheezes or rhonchi. HEART:  Regular rate and rhythm. No  murmur appreciated. ABDOMEN:  Soft, nontender.  Positive, normoactive bowel sounds. No organomegaly palpated. MSK:  No focal spinal tenderness to palpation. Full range of motion bilaterally in the upper extremities. EXTREMITIES:  No peripheral edema.   SKIN:  Clear with no obvious rashes or skin changes. No nail dyscrasia. NEURO:  Nonfocal. Well oriented.  Appropriate affect.   LABORATORY DATA:  None for this visit.  DIAGNOSTIC IMAGING:  None for this visit.      ASSESSMENT AND PLAN:  Valerie Cooper is a pleasant 88 y.o. female with Stage 0 right breast DCIS, ER+/PR+, diagnosed in December 2022, treated with lumpectomy, adjuvant radiation therapy.  She presents to the Survivorship Clinic for our initial meeting and routine follow-up post-completion of treatment for breast cancer.    1. Stage 0 right breast cancer:  Valerie Cooper is continuing to recover from definitive treatment for breast cancer. She will follow-up with her medical oncologist, Dr. Barbee Shropshire in November 2023 and with Dr. Chryl Heck in May 2024 with history and physical exam per surveillance protocol.  We reviewed the necessity of taking tamoxifen today.  I discussed the [facility] DCIS recurrence risk nomogram with her and we entered her data.  Her 5-year risk of recurrence with tamoxifen is 1% whereas without tamoxifen her risk is 3%.  After discussing the potential side effects of the medication Valerie Cooper feels comfortable not taking tamoxifen.  Mammogram is due December 2023; orders placed today. Today, a comprehensive survivorship care plan and treatment summary was reviewed with the patient today detailing her breast cancer diagnosis, treatment course, potential late/long-term effects of treatment, appropriate follow-up care with recommendations for the future, and patient education resources.  A copy of this summary, along with a letter will be sent to the patient's primary care provider via mail/fax/In Basket message after today's visit.    2.   Stomach concerns and nausea: This is due to her previous gallstones.  I recommended that she give yourself more time to heal and recover.  3. Bone health:    She was given education on specific activities to promote bone health.  4. Cancer screening:  Due to Valerie Cooper's history and her age, she should receive screening for skin cancers.  The information and recommendations are listed on the patient's comprehensive care plan/treatment summary and were reviewed in detail with the patient.    5. Health maintenance and wellness promotion: Valerie Cooper was encouraged to consume 5-7 servings of fruits and vegetables per day. We reviewed the "Nutrition Rainbow" handout  She was also encouraged to engage in moderate to vigorous exercise for 30 minutes per day most days of  the week. We discussed the [employer] fitness program, which is designed for cancer survivors to help them become more physically fit after cancer treatments.  She was instructed to limit her alcohol consumption and continue to abstain from tobacco use.     6. Support services/counseling: It is not uncommon for this period of the patient's cancer care trajectory to be one of many emotions and stressors.  She was given information regarding our available services and encouraged to contact me with any questions or for help enrolling in any of our support group/programs.    Follow up instructions:    -Return to cancer center in May 2024 -Mammogram due in December 2023 -Follow up with surgery November 2023 -Referral to dermatology placed. -She is welcome to return back to the Survivorship Clinic at any time; no additional follow-up needed at this time.  -Consider referral back to survivorship as a long-term survivor for continued surveillance  The patient was provided an opportunity to ask questions and all were answered. The patient agreed with the plan and demonstrated an understanding of the instructions.   Total encounter time:40  minutes*in face-to-face visit time, chart review, lab review, care coordination, order entry, and documentation of the encounter time.    Wilber Bihari, NP 12/28/21 12:23 PM Medical Oncology and Hematology [facility] [place], [place] [address] Tel. [phone]    Fax. [phone]  *Total Encounter Time as defined by the Centers for Medicare and Medicaid Services includes, in addition to the face-to-face time of a patient visit (documented in the note above) non-face-to-face time: obtaining and reviewing outside history, ordering and reviewing medications, tests or procedures, care coordination (communications with other health care professionals or caregivers) and documentation in the medical record.

## 2022-01-01 ENCOUNTER — Other Ambulatory Visit: Payer: Self-pay | Admitting: Internal Medicine

## 2022-01-01 DIAGNOSIS — Z9889 Other specified postprocedural states: Secondary | ICD-10-CM

## 2022-01-27 ENCOUNTER — Encounter: Payer: Self-pay | Admitting: Podiatry

## 2022-01-27 ENCOUNTER — Ambulatory Visit: Payer: Medicare Other | Admitting: Podiatry

## 2022-01-27 DIAGNOSIS — L03032 Cellulitis of left toe: Secondary | ICD-10-CM | POA: Diagnosis not present

## 2022-01-27 MED ORDER — NEOMYCIN-POLYMYXIN-HC 1 % OT SOLN: OTIC | 1 refills | Status: DC

## 2022-01-27 NOTE — Patient Instructions (Signed)

## 2022-01-28 ENCOUNTER — Ambulatory Visit: Payer: Self-pay | Admitting: *Deleted

## 2022-01-28 NOTE — Progress Notes (Signed)
Subjective:  Patient ID: Valerie Cooper, female    DOB: 12-31-1933,  MRN: [id number] HPI Chief Complaint  Patient presents with   Toe Pain    Hallux left - lateral border, tender, swollen x 2 weeks, tried soaking epsom, neosporin, PCP evaluated last week-Rx'd cephalexin   New Patient (Initial Visit)    88 y.o. female presents with the above complaint.   ROS: Denies fever chills nausea vomiting muscle aches pains calf pain back pain chest pain shortness of breath.  Past Medical History:  Diagnosis Date   Cancer ([place]) 04/2021   right breast DCIS   Cataract    Complication of anesthesia, + pseudocholinesterase deficiency    It takes awhile for me to wake up "   DJD (degenerative joint disease)    Family history of anesthesia complication    GERD (gastroesophageal reflux disease)    H/O colonoscopy 04/20/2003   Hyperlipidemia    Hypertension    Osteoporosis    Shingles 04/20/1999   right leg   Ulcer 1980s   PUD ?due to ASA intake   Past Surgical History:  Procedure Laterality Date   ABDOMINAL HYSTERECTOMY  1975   BREAST LUMPECTOMY WITH RADIOACTIVE SEED LOCALIZATION Right 06/17/2021   Procedure: RIGHT BREAST SEED LOCALIZED LUMPECTOMY;  Surgeon: Stark Klein, MD;  Location: [place];  Service: General;  Laterality: Right;   CHOLECYSTECTOMY  05/29/2012   Dr Lucia Gaskins   CHOLECYSTECTOMY N/A 05/29/2012   Procedure: LAPAROSCOPIC CHOLECYSTECTOMY WITH INTRAOPERATIVE CHOLANGIOGRAM;  Surgeon: Shann Medal, MD;  Location: [place];  Service: General;  Laterality: N/A;   ENDOSCOPIC RETROGRADE CHOLANGIOPANCREATOGRAPHY (ERCP) WITH PROPOFOL N/A 10/01/2021   Procedure: ENDOSCOPIC RETROGRADE CHOLANGIOPANCREATOGRAPHY (ERCP) WITH PROPOFOL;  Surgeon: Ronnette Juniper, MD;  Location: [place];  Service: Gastroenterology;  Laterality: N/A;   FRACTURE SURGERY     L arm   REMOVAL OF STONES  10/01/2021   Procedure: REMOVAL OF STONES;  Surgeon: Ronnette Juniper, MD;  Location: [facility] ENDOSCOPY;   Service: Gastroenterology;;   ROTATOR CUFF REPAIR Bilateral 2005   4 surgeries   SPHINCTEROTOMY  10/01/2021   Procedure: SPHINCTEROTOMY;  Surgeon: Ronnette Juniper, MD;  Location: [place];  Service: Gastroenterology;;   STONE EXTRACTION WITH BASKET  10/01/2021   Procedure: STONE EXTRACTION WITH BASKET;  Surgeon: Ronnette Juniper, MD;  Location: [facility] ENDOSCOPY;  Service: Gastroenterology;;    Current Outpatient Medications:    NEOMYCIN-POLYMYXIN-HYDROCORTISONE (CORTISPORIN) 1 % SOLN OTIC solution, Apply 1-2 drops to toe BID after soaking, Disp: 10 mL, Rfl: 1   amLODipine (NORVASC) 5 MG tablet, Take by mouth 2 (two) times daily., Disp: , Rfl:    atorvastatin (LIPITOR) 20 MG tablet, Take 20 mg by mouth daily., Disp: , Rfl:    cephALEXin (KEFLEX) 500 MG capsule, Take 500 mg by mouth 2 (two) times daily., Disp: , Rfl:    cholecalciferol (VITAMIN D3) 25 MCG (1000 UNIT) tablet, Take 1,000 Units by mouth daily., Disp: , Rfl:    ketorolac (ACULAR) 0.5 % ophthalmic solution, Place 1 drop into the right eye 3 (three) times daily., Disp: , Rfl:    pantoprazole (PROTONIX) 40 MG tablet, Take 40 mg by mouth daily., Disp: , Rfl:    prednisoLONE acetate (PRED FORTE) 1 % ophthalmic suspension, Place 1 drop into the right eye 3 (three) times daily., Disp: , Rfl:    valsartan (DIOVAN) 80 MG tablet, Take 80 mg by mouth daily., Disp: , Rfl:   Allergies  Allergen Reactions   Antivert [Meclizine Hcl]  Other reaction(s): hallucinations   Other Other (See Comments)    Anesthesia medications cause "deep sleep"   Review of Systems Objective:  There were no vitals filed for this visit.  General: Well developed, nourished, in no acute distress, alert and oriented x3   Dermatological: Skin is warm, dry and supple bilateral. Nails x 10 are well maintained; remaining integument appears unremarkable at this time. There are no open sores, no preulcerative lesions, no rash or signs of infection present.  Sharp incurvated  nail with gross granulation tissue to the fibular border of the hallux left.  Exquisitely tender on palpation.  Vascular: Dorsalis Pedis artery and Posterior Tibial artery pedal pulses are 2/4 bilateral with immedate capillary fill time. Pedal hair growth present. No varicosities and no lower extremity edema present bilateral.   Neruologic: Grossly intact via light touch bilateral. Vibratory intact via tuning fork bilateral. Protective threshold with Semmes Wienstein monofilament intact to all pedal sites bilateral. Patellar and Achilles deep tendon reflexes 2+ bilateral. No Babinski or clonus noted bilateral.   Musculoskeletal: No gross boney pedal deformities bilateral. No pain, crepitus, or limitation noted with foot and ankle range of motion bilateral. Muscular strength 5/5 in all groups tested bilateral.  Gait: Unassisted, Nonantalgic.    Radiographs:  None taken  Assessment & Plan:   Assessment: Ingrown nail fibular border hallux left  Plan: Partial nail avulsion with incision and drainage of the fibular border of the hallux left.  This was performed after local anesthetic was administered she tolerated procedure well.  She was given both oral and home-going instruction for care and soaking of the toe she will continue to take her antibiotic.  I will follow-up with her in a couple weeks to make sure she is healing well.     Rubylee Zamarripa T. [place], [place]

## 2022-01-28 NOTE — Telephone Encounter (Signed)
Pt saw podiatrist yesterday and was supposed to soak her foot in betadine which she does not have. She asked if she could soak it in Epson salt instead. Her discharge instructions saw to soak in Betadine but if gets infected soak in epson salt. So I told pt if that is what she has she can use that.  Answer Assessment - Initial Assessment Questions 1. REASON FOR CALL or QUESTION: "What is your reason for calling today?" or "How can I best help you?" or "What question do you have that I can help answer?"     Do not have betadine can I use Epson salt.  Protocols used: Information Only Call - No Triage-A-AH

## 2022-02-24 ENCOUNTER — Encounter: Payer: Self-pay | Admitting: Podiatry

## 2022-02-24 ENCOUNTER — Ambulatory Visit: Payer: Medicare Other | Admitting: Podiatry

## 2022-02-24 DIAGNOSIS — Z9889 Other specified postprocedural states: Secondary | ICD-10-CM

## 2022-02-24 DIAGNOSIS — L03032 Cellulitis of left toe: Secondary | ICD-10-CM

## 2022-02-24 NOTE — Progress Notes (Signed)
She presents today for follow-up of her matrixectomy fibular border hallux left.  States that it is feeling the best that is ever felt.  She states that she is no longer treating and doing nothing for it.  Objective: Vital signs are stable she is alert and oriented x3 there is no erythema edema cellulitis drainage odor nail margin is gone on to heal 100%.  Assessment: Well-healing surgical toe.  Plan: Follow-up with me on an as-needed basis.

## 2022-04-16 ENCOUNTER — Ambulatory Visit
Admission: RE | Admit: 2022-04-16 | Discharge: 2022-04-16 | Disposition: A | Discharge status: Home / Self Care | Payer: Medicare Other | Source: Ambulatory Visit | Attending: Internal Medicine | Admitting: Internal Medicine

## 2022-04-16 DIAGNOSIS — Z9889 Other specified postprocedural states: Secondary | ICD-10-CM

## 2022-04-16 HISTORY — DX: Personal history of irradiation: Z92.3

## 2022-04-20 DIAGNOSIS — R059 Cough, unspecified: Secondary | ICD-10-CM | POA: Diagnosis not present

## 2022-04-20 DIAGNOSIS — B349 Viral infection, unspecified: Secondary | ICD-10-CM | POA: Diagnosis not present

## 2022-06-02 DIAGNOSIS — H43813 Vitreous degeneration, bilateral: Secondary | ICD-10-CM | POA: Diagnosis not present

## 2022-06-02 DIAGNOSIS — H35371 Puckering of macula, right eye: Secondary | ICD-10-CM | POA: Diagnosis not present

## 2022-06-02 DIAGNOSIS — H35353 Cystoid macular degeneration, bilateral: Secondary | ICD-10-CM | POA: Diagnosis not present

## 2022-07-09 DIAGNOSIS — H35371 Puckering of macula, right eye: Secondary | ICD-10-CM | POA: Diagnosis not present

## 2022-07-09 DIAGNOSIS — H35353 Cystoid macular degeneration, bilateral: Secondary | ICD-10-CM | POA: Diagnosis not present

## 2022-07-09 DIAGNOSIS — H43813 Vitreous degeneration, bilateral: Secondary | ICD-10-CM | POA: Diagnosis not present

## 2022-08-16 DIAGNOSIS — H35371 Puckering of macula, right eye: Secondary | ICD-10-CM | POA: Diagnosis not present

## 2022-08-16 DIAGNOSIS — H43813 Vitreous degeneration, bilateral: Secondary | ICD-10-CM | POA: Diagnosis not present

## 2022-08-16 DIAGNOSIS — H35353 Cystoid macular degeneration, bilateral: Secondary | ICD-10-CM | POA: Diagnosis not present

## 2022-08-17 DIAGNOSIS — J309 Allergic rhinitis, unspecified: Secondary | ICD-10-CM | POA: Diagnosis not present

## 2022-08-17 DIAGNOSIS — I700 Atherosclerosis of aorta: Secondary | ICD-10-CM | POA: Diagnosis not present

## 2022-08-17 DIAGNOSIS — I10 Essential (primary) hypertension: Secondary | ICD-10-CM | POA: Diagnosis not present

## 2022-08-17 DIAGNOSIS — Z0000 Encounter for general adult medical examination without abnormal findings: Secondary | ICD-10-CM | POA: Diagnosis not present

## 2022-08-17 DIAGNOSIS — E782 Mixed hyperlipidemia: Secondary | ICD-10-CM | POA: Diagnosis not present

## 2022-08-17 DIAGNOSIS — Z853 Personal history of malignant neoplasm of breast: Secondary | ICD-10-CM | POA: Diagnosis not present

## 2022-08-17 DIAGNOSIS — K219 Gastro-esophageal reflux disease without esophagitis: Secondary | ICD-10-CM | POA: Diagnosis not present

## 2022-08-17 DIAGNOSIS — E559 Vitamin D deficiency, unspecified: Secondary | ICD-10-CM | POA: Diagnosis not present

## 2022-08-17 DIAGNOSIS — N1831 Chronic kidney disease, stage 3a: Secondary | ICD-10-CM | POA: Diagnosis not present

## 2022-08-27 ENCOUNTER — Other Ambulatory Visit: Payer: Self-pay

## 2022-08-27 ENCOUNTER — Inpatient Hospital Stay: Payer: Medicare Other | Attending: Hematology and Oncology | Admitting: Hematology and Oncology

## 2022-08-27 VITALS — BP 143/69 | HR 74 | Temp 97.9°F | Resp 20 | Wt 156.5 lb

## 2022-08-27 DIAGNOSIS — Z170 Estrogen receptor positive status [ER+]: Secondary | ICD-10-CM | POA: Diagnosis not present

## 2022-08-27 DIAGNOSIS — Z79899 Other long term (current) drug therapy: Secondary | ICD-10-CM | POA: Diagnosis not present

## 2022-08-27 DIAGNOSIS — C50212 Malignant neoplasm of upper-inner quadrant of left female breast: Secondary | ICD-10-CM | POA: Diagnosis not present

## 2022-08-27 DIAGNOSIS — D0511 Intraductal carcinoma in situ of right breast: Secondary | ICD-10-CM | POA: Insufficient documentation

## 2022-08-27 NOTE — Progress Notes (Signed)
[facility] CONSULT NOTE  Patient Care Team: Georgann Housekeeper, MD as PCP - General (Internal Medicine) Lonie Peak, MD as Attending Physician (Radiation Oncology) Rachel Moulds, MD as Consulting Physician (Hematology and Oncology) Almond Lint, MD as Consulting Physician (General Surgery)  CHIEF COMPLAINTS/PURPOSE OF CONSULTATION:  Newly diagnosed breast cancer  HISTORY OF PRESENTING ILLNESS:  Valerie Cooper 89 y.o. female is here because of recent diagnosis of right sided breast cancer  I reviewed her records extensively and collaborated the history with the patient.  SUMMARY OF ONCOLOGIC HISTORY: Oncology History  Malignant neoplasm of upper-inner quadrant of right breast in female, estrogen receptor positive (HCC)  04/29/2021 Cancer Staging   Staging form: Breast, AJCC 8th Edition - Clinical stage from 04/29/2021: Stage 0 (cTis (DCIS), cN0, cM0, ER+, PR+) - Signed by Loa Socks, NP on 05/06/2021 Stage prefix: Initial diagnosis   05/06/2021 Initial Diagnosis   Malignant neoplasm of upper-inner quadrant of right breast in female, estrogen receptor positive (HCC)   07/20/2021 - 08/07/2021 Radiation Therapy   Site Technique Total Dose (Gy) Dose per Fx (Gy) Completed Fx Beam Energies  Breast, Right: Breast_R 3D 40.05/40.05 2.67 15/15 6XFFF     09/2021 - 09/2021 Anti-estrogen oral therapy   Tamoxifen--stopped after 3 days [facility] reviewed with patient and she does not wish to resume    She said she doesn't want to take any tamoxifen. She understands the risk of recurrence is slightly higher without the antiestrogen therapy.  She however feels good.  She is celebrating her 89th birthday.  She denies any breast changes.  She had her mammogram in December.  She is trying to stay as active as possible. Rest of the pertinent 10 point ROS reviewed and negative.  MEDICAL HISTORY:  Past Medical History:  Diagnosis Date   Cancer (HCC) 04/2021   right breast DCIS    Cataract    Complication of anesthesia, + pseudocholinesterase deficiency    It takes awhile for me to wake up "   DJD (degenerative joint disease)    Family history of anesthesia complication    GERD (gastroesophageal reflux disease)    H/O colonoscopy 04/20/2003   Hyperlipidemia    Hypertension    Osteoporosis    Personal history of radiation therapy    Shingles 04/20/1999   right leg   Ulcer 1980s   PUD ?due to ASA intake    SURGICAL HISTORY: Past Surgical History:  Procedure Laterality Date   ABDOMINAL HYSTERECTOMY  1975   BREAST BIOPSY Right 04/29/2021   BREAST LUMPECTOMY Right 06/17/2021   BREAST LUMPECTOMY WITH RADIOACTIVE SEED LOCALIZATION Right 06/17/2021   Procedure: RIGHT BREAST SEED LOCALIZED LUMPECTOMY;  Surgeon: Almond Lint, MD;  Location: [facility];  Service: General;  Laterality: Right;   CHOLECYSTECTOMY  05/29/2012   Dr Ezzard Standing   CHOLECYSTECTOMY N/A 05/29/2012   Procedure: LAPAROSCOPIC CHOLECYSTECTOMY WITH INTRAOPERATIVE CHOLANGIOGRAM;  Surgeon: Kandis Cocking, MD;  Location: [facility] OR;  Service: General;  Laterality: N/A;   ENDOSCOPIC RETROGRADE CHOLANGIOPANCREATOGRAPHY (ERCP) WITH PROPOFOL N/A 10/01/2021   Procedure: ENDOSCOPIC RETROGRADE CHOLANGIOPANCREATOGRAPHY (ERCP) WITH PROPOFOL;  Surgeon: Kerin Salen, MD;  Location: [facility] ENDOSCOPY;  Service: Gastroenterology;  Laterality: N/A;   FRACTURE SURGERY     L arm   REMOVAL OF STONES  10/01/2021   Procedure: REMOVAL OF STONES;  Surgeon: Kerin Salen, MD;  Location: [facility] ENDOSCOPY;  Service: Gastroenterology;;   ROTATOR CUFF REPAIR Bilateral 2005   4 surgeries   SPHINCTEROTOMY  10/01/2021  Procedure: SPHINCTEROTOMY;  Surgeon: Kerin Salen, MD;  Location: [facility] ENDOSCOPY;  Service: Gastroenterology;;   STONE EXTRACTION WITH BASKET  10/01/2021   Procedure: STONE EXTRACTION WITH BASKET;  Surgeon: Kerin Salen, MD;  Location: [facility] ENDOSCOPY;  Service: Gastroenterology;;    SOCIAL HISTORY: Social History    Socioeconomic History   Marital status: Widowed    Spouse name: Not on file   Number of children: Not on file   Years of education: Not on file   Highest education level: Not on file  Occupational History   Not on file  Tobacco Use   Smoking status: Never   Smokeless tobacco: Never  Vaping Use   Vaping Use: Never used  Substance and Sexual Activity   Alcohol use: No   Drug use: No   Sexual activity: Not Currently    Birth control/protection: Surgical  Other Topics Concern   Not on file  Social History Narrative   Not on file   Social Determinants of Health   Financial Resource Strain: Not on file  Food Insecurity: Not on file  Transportation Needs: Not on file  Physical Activity: Not on file  Stress: Not on file  Social Connections: Not on file  Intimate Partner Violence: Not on file    FAMILY HISTORY: Family History  Problem Relation Age of Onset   Stroke Mother    Stroke Father     ALLERGIES:  is allergic to antivert [meclizine hcl] and other.  MEDICATIONS:  Current Outpatient Medications  Medication Sig Dispense Refill   amLODipine (NORVASC) 5 MG tablet Take by mouth 2 (two) times daily.     atorvastatin (LIPITOR) 20 MG tablet Take 20 mg by mouth daily.     cholecalciferol (VITAMIN D3) 25 MCG (1000 UNIT) tablet Take 1,000 Units by mouth daily.     ketorolac (ACULAR) 0.5 % ophthalmic solution Place 1 drop into the right eye 3 (three) times daily.     pantoprazole (PROTONIX) 40 MG tablet Take 40 mg by mouth daily.     prednisoLONE acetate (PRED FORTE) 1 % ophthalmic suspension Place 1 drop into the right eye 3 (three) times daily.     valsartan (DIOVAN) 80 MG tablet Take 80 mg by mouth daily.     No current facility-administered medications for this visit.    REVIEW OF SYSTEMS:   Constitutional: Denies fevers, chills or abnormal night sweats Eyes: Denies blurriness of vision, double vision or watery eyes Ears, nose, mouth, throat, and face: Denies  mucositis or sore throat Respiratory: Denies cough, dyspnea or wheezes Cardiovascular: Denies palpitation, chest discomfort or lower extremity swelling Gastrointestinal:  Denies nausea, heartburn or change in bowel habits Skin: Denies abnormal skin rashes Lymphatics: Denies new lymphadenopathy or easy bruising Neurological:Denies numbness, tingling or new weaknesses Behavioral/Psych: Mood is stable, no new changes  Breast: Denies any palpable lumps or discharge All other systems were reviewed with the patient and are negative.  PHYSICAL EXAMINATION: ECOG PERFORMANCE STATUS: 0 - Asymptomatic  Vitals:   08/27/22 1121  BP: (!) 143/69  Pulse: 74  Resp: 20  Temp: 97.9 F (36.6 C)  SpO2: 98%    Filed Weights   08/27/22 1121  Weight: 156 lb 8 oz (71 kg)   Annual appearance: Alert, oriented and in no acute distress Breast: Bilateral breasts inspected and palpated.  No palpable masses or regional adenopathy  LABORATORY DATA:  I have reviewed the data as listed Lab Results  Component Value Date   WBC 13.3 (H) 10/03/2021  HGB 10.3 (L) 10/03/2021   HCT 31.1 (L) 10/03/2021   MCV 94.2 10/03/2021   PLT 217 10/03/2021   Lab Results  Component Value Date   NA 138 10/03/2021   K 3.2 (L) 10/03/2021   CL 109 10/03/2021   CO2 22 10/03/2021    RADIOGRAPHIC STUDIES: I have personally reviewed the radiological reports and agreed with the findings in the report.  ASSESSMENT AND PLAN:   This is a very pleasant 89 year old female patient with newly diagnosed right breast DCIS, ER positive who is here for recommendations.  We have discussed the following details.  Malignant neoplasm of upper-inner quadrant of right breast in female, estrogen receptor positive (HCC) This is a very pleasant 89 year old female patient with right breast DCIS referred to medical oncology for recommendations She is now status post right breast lumpectomy and adjuvant radiation She said she decided that she  doesn't want to try anti estrogen therapy. She understands the risk of recurrence is slightly higher at about 3% without antiestrogen therapy.  No concerns on breast exam.  No self breast changes reported.  Mammogram in December with no evidence of malignancy.  She will return to clinic in 1 year or sooner as needed.  Self breast exam advised and she was encouraged to contact us with any new questions or concerns.  Encouraged her walking and to stay active.     All questions were answered. The patient knows to call the clinic with any problems, questions or concerns.    Rachel Moulds, MD 08/27/22

## 2022-08-27 NOTE — Assessment & Plan Note (Addendum)
This is a very pleasant 89 year old female patient with right breast DCIS referred to medical oncology for recommendations She is now status post right breast lumpectomy and adjuvant radiation She said she decided that she doesn't want to try anti estrogen therapy. She understands the risk of recurrence is slightly higher at about 3% without antiestrogen therapy.  No concerns on breast exam.  No self breast changes reported.  Mammogram in December with no evidence of malignancy.  She will return to clinic in 1 year or sooner as needed.  Self breast exam advised and she was encouraged to contact us with any new questions or concerns.  Encouraged her walking and to stay active.

## 2022-09-07 DIAGNOSIS — I10 Essential (primary) hypertension: Secondary | ICD-10-CM | POA: Diagnosis not present

## 2022-10-12 DIAGNOSIS — H35353 Cystoid macular degeneration, bilateral: Secondary | ICD-10-CM | POA: Diagnosis not present

## 2022-10-12 DIAGNOSIS — H35371 Puckering of macula, right eye: Secondary | ICD-10-CM | POA: Diagnosis not present

## 2022-10-12 DIAGNOSIS — H43813 Vitreous degeneration, bilateral: Secondary | ICD-10-CM | POA: Diagnosis not present

## 2022-11-15 DIAGNOSIS — R42 Dizziness and giddiness: Secondary | ICD-10-CM | POA: Diagnosis not present

## 2022-11-15 DIAGNOSIS — R519 Headache, unspecified: Secondary | ICD-10-CM | POA: Diagnosis not present

## 2022-11-15 DIAGNOSIS — R531 Weakness: Secondary | ICD-10-CM | POA: Diagnosis not present

## 2022-12-14 DIAGNOSIS — C50211 Malignant neoplasm of upper-inner quadrant of right female breast: Secondary | ICD-10-CM | POA: Diagnosis not present

## 2022-12-14 DIAGNOSIS — Z170 Estrogen receptor positive status [ER+]: Secondary | ICD-10-CM | POA: Diagnosis not present

## 2022-12-27 DIAGNOSIS — M79602 Pain in left arm: Secondary | ICD-10-CM | POA: Diagnosis not present

## 2023-01-28 DIAGNOSIS — H43813 Vitreous degeneration, bilateral: Secondary | ICD-10-CM | POA: Diagnosis not present

## 2023-01-28 DIAGNOSIS — H35353 Cystoid macular degeneration, bilateral: Secondary | ICD-10-CM | POA: Diagnosis not present

## 2023-01-28 DIAGNOSIS — H35371 Puckering of macula, right eye: Secondary | ICD-10-CM | POA: Diagnosis not present

## 2023-02-15 DIAGNOSIS — K2960 Other gastritis without bleeding: Secondary | ICD-10-CM | POA: Diagnosis not present

## 2023-02-15 DIAGNOSIS — E782 Mixed hyperlipidemia: Secondary | ICD-10-CM | POA: Diagnosis not present

## 2023-02-15 DIAGNOSIS — Z23 Encounter for immunization: Secondary | ICD-10-CM | POA: Diagnosis not present

## 2023-02-15 DIAGNOSIS — I700 Atherosclerosis of aorta: Secondary | ICD-10-CM | POA: Diagnosis not present

## 2023-02-15 DIAGNOSIS — N1831 Chronic kidney disease, stage 3a: Secondary | ICD-10-CM | POA: Diagnosis not present

## 2023-02-15 DIAGNOSIS — I10 Essential (primary) hypertension: Secondary | ICD-10-CM | POA: Diagnosis not present

## 2023-02-15 DIAGNOSIS — J309 Allergic rhinitis, unspecified: Secondary | ICD-10-CM | POA: Diagnosis not present

## 2023-02-25 DIAGNOSIS — H35371 Puckering of macula, right eye: Secondary | ICD-10-CM | POA: Diagnosis not present

## 2023-02-25 DIAGNOSIS — H35353 Cystoid macular degeneration, bilateral: Secondary | ICD-10-CM | POA: Diagnosis not present

## 2023-02-25 DIAGNOSIS — H43813 Vitreous degeneration, bilateral: Secondary | ICD-10-CM | POA: Diagnosis not present

## 2023-04-19 ENCOUNTER — Ambulatory Visit
Admission: RE | Admit: 2023-04-19 | Discharge: 2023-04-19 | Disposition: A | Discharge status: Home / Self Care | Payer: Medicare Other | Source: Ambulatory Visit | Attending: Hematology and Oncology

## 2023-04-19 DIAGNOSIS — Z9889 Other specified postprocedural states: Secondary | ICD-10-CM | POA: Diagnosis not present

## 2023-04-19 DIAGNOSIS — R92333 Mammographic heterogeneous density, bilateral breasts: Secondary | ICD-10-CM | POA: Diagnosis not present

## 2023-04-19 DIAGNOSIS — Z170 Estrogen receptor positive status [ER+]: Secondary | ICD-10-CM

## 2023-05-03 DIAGNOSIS — R142 Eructation: Secondary | ICD-10-CM | POA: Diagnosis not present

## 2023-05-03 DIAGNOSIS — K219 Gastro-esophageal reflux disease without esophagitis: Secondary | ICD-10-CM | POA: Diagnosis not present

## 2023-05-03 DIAGNOSIS — N1831 Chronic kidney disease, stage 3a: Secondary | ICD-10-CM | POA: Diagnosis not present

## 2023-05-03 DIAGNOSIS — M25519 Pain in unspecified shoulder: Secondary | ICD-10-CM | POA: Diagnosis not present

## 2023-05-10 DIAGNOSIS — M25519 Pain in unspecified shoulder: Secondary | ICD-10-CM | POA: Diagnosis not present

## 2023-05-20 DIAGNOSIS — R142 Eructation: Secondary | ICD-10-CM | POA: Diagnosis not present

## 2023-05-20 DIAGNOSIS — K7689 Other specified diseases of liver: Secondary | ICD-10-CM | POA: Diagnosis not present

## 2023-05-23 DIAGNOSIS — H43813 Vitreous degeneration, bilateral: Secondary | ICD-10-CM | POA: Diagnosis not present

## 2023-05-23 DIAGNOSIS — H35371 Puckering of macula, right eye: Secondary | ICD-10-CM | POA: Diagnosis not present

## 2023-05-23 DIAGNOSIS — H35353 Cystoid macular degeneration, bilateral: Secondary | ICD-10-CM | POA: Diagnosis not present

## 2023-06-07 DIAGNOSIS — M25561 Pain in right knee: Secondary | ICD-10-CM | POA: Diagnosis not present

## 2023-06-07 DIAGNOSIS — M25562 Pain in left knee: Secondary | ICD-10-CM | POA: Diagnosis not present

## 2023-06-07 DIAGNOSIS — M79606 Pain in leg, unspecified: Secondary | ICD-10-CM | POA: Diagnosis not present

## 2023-06-21 DIAGNOSIS — H35353 Cystoid macular degeneration, bilateral: Secondary | ICD-10-CM | POA: Diagnosis not present

## 2023-06-21 DIAGNOSIS — M25569 Pain in unspecified knee: Secondary | ICD-10-CM | POA: Diagnosis not present

## 2023-06-21 DIAGNOSIS — H43813 Vitreous degeneration, bilateral: Secondary | ICD-10-CM | POA: Diagnosis not present

## 2023-06-21 DIAGNOSIS — H35371 Puckering of macula, right eye: Secondary | ICD-10-CM | POA: Diagnosis not present

## 2023-07-13 DIAGNOSIS — H35359 Cystoid macular degeneration, unspecified eye: Secondary | ICD-10-CM | POA: Diagnosis not present

## 2023-07-13 DIAGNOSIS — Z961 Presence of intraocular lens: Secondary | ICD-10-CM | POA: Diagnosis not present

## 2023-08-02 ENCOUNTER — Telehealth: Payer: Self-pay | Admitting: Hematology and Oncology

## 2023-08-02 NOTE — Telephone Encounter (Signed)
 Confirmed with pt about rescheduled date and time.

## 2023-08-19 DIAGNOSIS — Z0000 Encounter for general adult medical examination without abnormal findings: Secondary | ICD-10-CM | POA: Diagnosis not present

## 2023-08-19 DIAGNOSIS — E782 Mixed hyperlipidemia: Secondary | ICD-10-CM | POA: Diagnosis not present

## 2023-08-19 DIAGNOSIS — N1831 Chronic kidney disease, stage 3a: Secondary | ICD-10-CM | POA: Diagnosis not present

## 2023-08-19 DIAGNOSIS — I10 Essential (primary) hypertension: Secondary | ICD-10-CM | POA: Diagnosis not present

## 2023-08-19 DIAGNOSIS — K219 Gastro-esophageal reflux disease without esophagitis: Secondary | ICD-10-CM | POA: Diagnosis not present

## 2023-08-19 DIAGNOSIS — J309 Allergic rhinitis, unspecified: Secondary | ICD-10-CM | POA: Diagnosis not present

## 2023-08-19 DIAGNOSIS — M8588 Other specified disorders of bone density and structure, other site: Secondary | ICD-10-CM | POA: Diagnosis not present

## 2023-08-19 DIAGNOSIS — E559 Vitamin D deficiency, unspecified: Secondary | ICD-10-CM | POA: Diagnosis not present

## 2023-08-19 DIAGNOSIS — I700 Atherosclerosis of aorta: Secondary | ICD-10-CM | POA: Diagnosis not present

## 2023-08-19 DIAGNOSIS — Z853 Personal history of malignant neoplasm of breast: Secondary | ICD-10-CM | POA: Diagnosis not present

## 2023-08-19 DIAGNOSIS — R7309 Other abnormal glucose: Secondary | ICD-10-CM | POA: Diagnosis not present

## 2023-08-19 DIAGNOSIS — Z23 Encounter for immunization: Secondary | ICD-10-CM | POA: Diagnosis not present

## 2023-08-22 ENCOUNTER — Telehealth: Payer: Self-pay | Admitting: Adult Health

## 2023-08-22 NOTE — Telephone Encounter (Signed)
 I rescheduled Valerie Cooper's appointments as Valerie Cooper will not be in the office. Valerie Cooper is acceptable to her rescheduled appointment.

## 2023-09-01 ENCOUNTER — Ambulatory Visit: Payer: Medicare Other | Admitting: Hematology and Oncology

## 2023-09-08 ENCOUNTER — Ambulatory Visit: Admitting: Adult Health

## 2023-09-08 ENCOUNTER — Telehealth: Payer: Self-pay

## 2023-09-09 ENCOUNTER — Inpatient Hospital Stay: Attending: Hematology and Oncology | Admitting: Hematology and Oncology

## 2023-09-09 VITALS — BP 122/56 | HR 61 | Temp 98.2°F | Resp 16 | Wt 163.5 lb

## 2023-09-09 DIAGNOSIS — C50212 Malignant neoplasm of upper-inner quadrant of left female breast: Secondary | ICD-10-CM | POA: Diagnosis not present

## 2023-09-09 DIAGNOSIS — D0511 Intraductal carcinoma in situ of right breast: Secondary | ICD-10-CM | POA: Diagnosis not present

## 2023-09-09 DIAGNOSIS — Z1721 Progesterone receptor positive status: Secondary | ICD-10-CM | POA: Insufficient documentation

## 2023-09-09 DIAGNOSIS — Z79899 Other long term (current) drug therapy: Secondary | ICD-10-CM | POA: Insufficient documentation

## 2023-09-09 DIAGNOSIS — Z170 Estrogen receptor positive status [ER+]: Secondary | ICD-10-CM | POA: Insufficient documentation

## 2023-09-09 DIAGNOSIS — Z923 Personal history of irradiation: Secondary | ICD-10-CM | POA: Diagnosis not present

## 2023-09-09 NOTE — Assessment & Plan Note (Addendum)
 This is a very pleasant 89 year old female patient with right breast DCIS referred to medical oncology for recommendations She is now status post right breast lumpectomy and adjuvant radiation She said she decided that she doesn't want to try anti estrogen therapy. She understands the risk of recurrence is slightly higher at about 3% without antiestrogen therapy.  No concerns on breast exam.  No self breast changes reported.  Mammogram in December with no evidence of malignancy.   Assessment and Plan Assessment & Plan Breast cancer Eligible for diagnostic mammogram due to dense breast tissue.  - No concern for recurrence at this time - Send reminder to schedule diagnostic mammogram close to September or October. - Instruct to perform self breast exam monthly and report any new lumps, nipple changes, or knots. - Encourage regular activity, healthy diet and sleep.

## 2023-09-09 NOTE — Progress Notes (Signed)
 [facility] CONSULT NOTE  Patient Care Team: Jearldine Mina, MD as PCP - General (Internal Medicine) Colie Dawes, MD as Attending Physician (Radiation Oncology) Murleen Arms, MD as Consulting Physician (Hematology and Oncology) Lockie Rima, MD as Consulting Physician (General Surgery)  CHIEF COMPLAINTS/PURPOSE OF CONSULTATION:  Newly diagnosed breast cancer  HISTORY OF PRESENTING ILLNESS:  Valerie Cooper 89 y.o. female is here because of recent diagnosis of right sided breast cancer  I reviewed her records extensively and collaborated the history with the patient.  SUMMARY OF ONCOLOGIC HISTORY: Oncology History  Malignant neoplasm of upper-inner quadrant of right breast in female, estrogen receptor positive (HCC)  04/29/2021 Cancer Staging   Staging form: Breast, AJCC 8th Edition - Clinical stage from 04/29/2021: Stage 0 (cTis (DCIS), cN0, cM0, ER+, PR+) - Signed by Percival Brace, NP on 05/06/2021 Stage prefix: Initial diagnosis   05/06/2021 Initial Diagnosis   Malignant neoplasm of upper-inner quadrant of right breast in female, estrogen receptor positive (HCC)   07/20/2021 - 08/07/2021 Radiation Therapy   Site Technique Total Dose (Gy) Dose per Fx (Gy) Completed Fx Beam Energies  Breast, Right: Breast_R 3D 40.05/40.05 2.67 15/15 6XFFF     09/2021 - 09/2021 Anti-estrogen oral therapy   Tamoxifen --stopped after 3 days [facility] reviewed with patient and she does not wish to resume    Discussed the use of AI scribe software for clinical note transcription with the patient, who gave verbal consent to proceed.  History of Present Illness ALLICIA Cooper is an 89 year old female who presents for an annual follow-up visit.  No significant changes in health since the last visit a year ago. Actively avoids hospitals and doctors. Mammogram in December was normal. Currently taking medications for hypertension, hyperlipidemia, vitamin D  supplementation, and eye  drops. No new medications since the last visit.  Socially, she is very active, involved in church work, and helps care for her brother who is an alcoholic. Enjoys watching quiz shows and doing word puzzles. Emphasizes staying active and not being sedentary, which she believes contributes to her longevity.  No blood in stool, black stools, trouble urinating, or new bone pains. Reports a weight gain of six to seven pounds since the last visit.   Rest of the pertinent 10 point ROS reviewed and negative.  MEDICAL HISTORY:  Past Medical History:  Diagnosis Date   Cancer (HCC) 04/2021   right breast DCIS   Cataract    Complication of anesthesia, + pseudocholinesterase deficiency    It takes awhile for me to wake up "   DJD (degenerative joint disease)    Family history of anesthesia complication    GERD (gastroesophageal reflux disease)    H/O colonoscopy 04/20/2003   Hyperlipidemia    Hypertension    Osteoporosis    Personal history of radiation therapy    Shingles 04/20/1999   right leg   Ulcer 1980s   PUD ?due to ASA intake    SURGICAL HISTORY: Past Surgical History:  Procedure Laterality Date   ABDOMINAL HYSTERECTOMY  1975   BREAST BIOPSY Right 04/29/2021   BREAST LUMPECTOMY Right 06/17/2021   BREAST LUMPECTOMY WITH RADIOACTIVE SEED LOCALIZATION Right 06/17/2021   Procedure: RIGHT BREAST SEED LOCALIZED LUMPECTOMY;  Surgeon: Lockie Rima, MD;  Location: [facility];  Service: General;  Laterality: Right;   CHOLECYSTECTOMY  05/29/2012   Dr Odean Bend   CHOLECYSTECTOMY N/A 05/29/2012   Procedure: LAPAROSCOPIC CHOLECYSTECTOMY WITH INTRAOPERATIVE CHOLANGIOGRAM;  Surgeon: Thayne Fine, MD;  Location: [facility]  OR;  Service: General;  Laterality: N/A;   ENDOSCOPIC RETROGRADE CHOLANGIOPANCREATOGRAPHY (ERCP) WITH PROPOFOL  N/A 10/01/2021   Procedure: ENDOSCOPIC RETROGRADE CHOLANGIOPANCREATOGRAPHY (ERCP) WITH PROPOFOL ;  Surgeon: Genell Ken, MD;  Location: [facility] ENDOSCOPY;  Service:  Gastroenterology;  Laterality: N/A;   FRACTURE SURGERY     L arm   REMOVAL OF STONES  10/01/2021   Procedure: REMOVAL OF STONES;  Surgeon: Genell Ken, MD;  Location: [facility] ENDOSCOPY;  Service: Gastroenterology;;   ROTATOR CUFF REPAIR Bilateral 2005   4 surgeries   SPHINCTEROTOMY  10/01/2021   Procedure: SPHINCTEROTOMY;  Surgeon: Genell Ken, MD;  Location: [facility] ENDOSCOPY;  Service: Gastroenterology;;   STONE EXTRACTION WITH BASKET  10/01/2021   Procedure: STONE EXTRACTION WITH BASKET;  Surgeon: Genell Ken, MD;  Location: [facility] ENDOSCOPY;  Service: Gastroenterology;;    SOCIAL HISTORY: Social History   Socioeconomic History   Marital status: Widowed    Spouse name: Not on file   Number of children: Not on file   Years of education: Not on file   Highest education level: Not on file  Occupational History   Not on file  Tobacco Use   Smoking status: Never   Smokeless tobacco: Never  Vaping Use   Vaping status: Never Used  Substance and Sexual Activity   Alcohol use: No   Drug use: No   Sexual activity: Not Currently    Birth control/protection: Surgical  Other Topics Concern   Not on file  Social History Narrative   Not on file   Social Drivers of Health   Financial Resource Strain: Not on file  Food Insecurity: Not on file  Transportation Needs: Not on file  Physical Activity: Not on file  Stress: Not on file  Social Connections: Not on file  Intimate Partner Violence: Not on file    FAMILY HISTORY: Family History  Problem Relation Age of Onset   Stroke Mother    Stroke Father     ALLERGIES:  is allergic to antivert  [meclizine  hcl] and other.  MEDICATIONS:  Current Outpatient Medications  Medication Sig Dispense Refill   amLODipine  (NORVASC ) 5 MG tablet Take by mouth 2 (two) times daily.     atorvastatin  (LIPITOR) 20 MG tablet Take 20 mg by mouth daily.     cholecalciferol  (VITAMIN D3) 25 MCG (1000 UNIT) tablet Take 1,000 Units by mouth daily.     ketorolac   (ACULAR ) 0.5 % ophthalmic solution Place 1 drop into the right eye 3 (three) times daily.     pantoprazole  (PROTONIX ) 40 MG tablet Take 40 mg by mouth daily.     prednisoLONE acetate (PRED FORTE) 1 % ophthalmic suspension Place 1 drop into the right eye 3 (three) times daily.     valsartan (DIOVAN) 80 MG tablet Take 80 mg by mouth daily.     No current facility-administered medications for this visit.    REVIEW OF SYSTEMS:   Constitutional: Denies fevers, chills or abnormal night sweats Eyes: Denies blurriness of vision, double vision or watery eyes Ears, nose, mouth, throat, and face: Denies mucositis or sore throat Respiratory: Denies cough, dyspnea or wheezes Cardiovascular: Denies palpitation, chest discomfort or lower extremity swelling Gastrointestinal:  Denies nausea, heartburn or change in bowel habits Skin: Denies abnormal skin rashes Lymphatics: Denies new lymphadenopathy or easy bruising Neurological:Denies numbness, tingling or new weaknesses Behavioral/Psych: Mood is stable, no new changes  Breast: Denies any palpable lumps or discharge All other systems were reviewed with the patient and are negative.  PHYSICAL EXAMINATION: ECOG PERFORMANCE  STATUS: 0 - Asymptomatic  Vitals:   09/09/23 1227  BP: (!) 122/56  Pulse: 61  Resp: 16  Temp: 98.2 F (36.8 C)  SpO2: 99%    Filed Weights   09/09/23 1227  Weight: 163 lb 8 oz (74.2 kg)   Annual appearance: Alert, oriented and in no acute distress Breast: Bilateral breasts inspected and palpated.  No palpable masses or regional adenopathy  Chest: CTA bilaterally Heart: RRR Abdomen: soft, non tender, non distended.  LABORATORY DATA:  I have reviewed the data as listed Lab Results  Component Value Date   WBC 13.3 (H) 10/03/2021   HGB 10.3 (L) 10/03/2021   HCT 31.1 (L) 10/03/2021   MCV 94.2 10/03/2021   PLT 217 10/03/2021   Lab Results  Component Value Date   NA 138 10/03/2021   K 3.2 (L) 10/03/2021   CL 109  10/03/2021   CO2 22 10/03/2021    RADIOGRAPHIC STUDIES: I have personally reviewed the radiological reports and agreed with the findings in the report.  ASSESSMENT AND PLAN:   This is a very pleasant 89 year old female patient with newly diagnosed right breast DCIS, ER positive who is here for recommendations.  We have discussed the following details.  Malignant neoplasm of upper-inner quadrant of right breast in female, estrogen receptor positive (HCC) This is a very pleasant 89 year old female patient with right breast DCIS referred to medical oncology for recommendations She is now status post right breast lumpectomy and adjuvant radiation She said she decided that she doesn't want to try anti estrogen therapy. She understands the risk of recurrence is slightly higher at about 3% without antiestrogen therapy.  No concerns on breast exam.  No self breast changes reported.  Mammogram in December with no evidence of malignancy.   Assessment and Plan Assessment & Plan Breast cancer Eligible for diagnostic mammogram due to dense breast tissue.  - No concern for recurrence at this time - Send reminder to schedule diagnostic mammogram close to September or October. - Instruct to perform self breast exam monthly and report any new lumps, nipple changes, or knots. - Encourage regular activity, healthy diet and sleep.       All questions were answered. The patient knows to call the clinic with any problems, questions or concerns.    Murleen Arms, MD 09/09/23

## 2023-09-19 DIAGNOSIS — R197 Diarrhea, unspecified: Secondary | ICD-10-CM | POA: Diagnosis not present

## 2023-09-19 DIAGNOSIS — B349 Viral infection, unspecified: Secondary | ICD-10-CM | POA: Diagnosis not present

## 2023-09-26 DIAGNOSIS — H40051 Ocular hypertension, right eye: Secondary | ICD-10-CM | POA: Diagnosis not present

## 2023-09-26 DIAGNOSIS — H43813 Vitreous degeneration, bilateral: Secondary | ICD-10-CM | POA: Diagnosis not present

## 2023-09-26 DIAGNOSIS — H35353 Cystoid macular degeneration, bilateral: Secondary | ICD-10-CM | POA: Diagnosis not present

## 2023-09-26 DIAGNOSIS — H35371 Puckering of macula, right eye: Secondary | ICD-10-CM | POA: Diagnosis not present

## 2023-10-10 NOTE — Telephone Encounter (Signed)
 Called to confirm appt.

## 2023-10-25 DIAGNOSIS — R350 Frequency of micturition: Secondary | ICD-10-CM | POA: Diagnosis not present

## 2023-10-25 DIAGNOSIS — D72829 Elevated white blood cell count, unspecified: Secondary | ICD-10-CM | POA: Diagnosis not present

## 2023-11-22 DIAGNOSIS — R195 Other fecal abnormalities: Secondary | ICD-10-CM | POA: Diagnosis not present

## 2023-11-22 DIAGNOSIS — R350 Frequency of micturition: Secondary | ICD-10-CM | POA: Diagnosis not present

## 2023-12-20 DIAGNOSIS — Z170 Estrogen receptor positive status [ER+]: Secondary | ICD-10-CM | POA: Diagnosis not present

## 2023-12-20 DIAGNOSIS — C50211 Malignant neoplasm of upper-inner quadrant of right female breast: Secondary | ICD-10-CM | POA: Diagnosis not present

## 2024-01-23 ENCOUNTER — Other Ambulatory Visit (HOSPITAL_COMMUNITY): Payer: Self-pay | Admitting: Internal Medicine

## 2024-01-23 DIAGNOSIS — R42 Dizziness and giddiness: Secondary | ICD-10-CM | POA: Diagnosis not present

## 2024-01-23 DIAGNOSIS — K219 Gastro-esophageal reflux disease without esophagitis: Secondary | ICD-10-CM | POA: Diagnosis not present

## 2024-01-23 DIAGNOSIS — R519 Headache, unspecified: Secondary | ICD-10-CM | POA: Diagnosis not present

## 2024-01-25 ENCOUNTER — Encounter (HOSPITAL_COMMUNITY): Payer: Self-pay

## 2024-01-25 ENCOUNTER — Ambulatory Visit (HOSPITAL_COMMUNITY)

## 2024-01-26 ENCOUNTER — Ambulatory Visit (HOSPITAL_COMMUNITY)
Admission: RE | Admit: 2024-01-26 | Discharge: 2024-01-26 | Disposition: A | Discharge status: Home / Self Care | Source: Ambulatory Visit | Attending: Internal Medicine | Admitting: Internal Medicine

## 2024-01-26 DIAGNOSIS — R42 Dizziness and giddiness: Secondary | ICD-10-CM | POA: Insufficient documentation

## 2024-01-26 DIAGNOSIS — R220 Localized swelling, mass and lump, head: Secondary | ICD-10-CM | POA: Diagnosis not present

## 2024-04-20 ENCOUNTER — Ambulatory Visit
Admission: RE | Admit: 2024-04-20 | Discharge: 2024-04-20 | Disposition: A | Discharge status: Home / Self Care | Source: Ambulatory Visit | Attending: Hematology and Oncology | Admitting: Hematology and Oncology

## 2024-04-20 DIAGNOSIS — C50212 Malignant neoplasm of upper-inner quadrant of left female breast: Secondary | ICD-10-CM

## 2024-09-05 ENCOUNTER — Ambulatory Visit: Admitting: Hematology and Oncology
